# Patient Record
Sex: Female | Born: 1955 | Race: White | Hispanic: No | Marital: Single | Smoking: Former smoker
Health system: Southern US, Community
[De-identification: ages and names within clinical notes are randomized; demographics above are authoritative.]

## PROBLEM LIST (undated history)

## (undated) DIAGNOSIS — Z923 Personal history of irradiation: Secondary | ICD-10-CM

## (undated) DIAGNOSIS — F32A Depression, unspecified: Secondary | ICD-10-CM

## (undated) DIAGNOSIS — C519 Malignant neoplasm of vulva, unspecified: Secondary | ICD-10-CM

## (undated) DIAGNOSIS — Z8719 Personal history of other diseases of the digestive system: Secondary | ICD-10-CM

## (undated) DIAGNOSIS — F419 Anxiety disorder, unspecified: Secondary | ICD-10-CM

## (undated) DIAGNOSIS — Z86010 Personal history of colonic polyps: Secondary | ICD-10-CM

## (undated) DIAGNOSIS — D509 Iron deficiency anemia, unspecified: Secondary | ICD-10-CM

## (undated) DIAGNOSIS — C210 Malignant neoplasm of anus, unspecified: Secondary | ICD-10-CM

## (undated) DIAGNOSIS — E039 Hypothyroidism, unspecified: Secondary | ICD-10-CM

## (undated) DIAGNOSIS — M1990 Unspecified osteoarthritis, unspecified site: Secondary | ICD-10-CM

## (undated) DIAGNOSIS — E079 Disorder of thyroid, unspecified: Secondary | ICD-10-CM

## (undated) HISTORY — PX: COLONOSCOPY W/ POLYPECTOMY: SHX1380

## (undated) HISTORY — DX: Personal history of irradiation: Z92.3

## (undated) HISTORY — PX: WISDOM TOOTH EXTRACTION: SHX21

## (undated) HISTORY — DX: Personal history of colonic polyps: Z86.010

## (undated) HISTORY — PX: LAPAROSCOPIC GASTRIC BANDING WITH HIATAL HERNIA REPAIR: SHX6351

## (undated) HISTORY — DX: Disorder of thyroid, unspecified: E07.9

## (undated) HISTORY — PX: DILATION AND CURETTAGE OF UTERUS: SHX78

---

## 1999-03-23 HISTORY — PX: BREAST REDUCTION SURGERY: SHX8

## 2005-10-19 ENCOUNTER — Ambulatory Visit (HOSPITAL_COMMUNITY): Admission: RE | Admit: 2005-10-19 | Discharge: 2005-10-19 | Payer: Self-pay | Admitting: General Surgery

## 2005-10-27 ENCOUNTER — Ambulatory Visit (HOSPITAL_COMMUNITY): Admission: RE | Admit: 2005-10-27 | Discharge: 2005-10-27 | Payer: Self-pay | Admitting: General Surgery

## 2005-11-04 ENCOUNTER — Encounter: Admission: RE | Admit: 2005-11-04 | Discharge: 2005-11-04 | Payer: Self-pay | Admitting: General Surgery

## 2005-11-16 ENCOUNTER — Ambulatory Visit (HOSPITAL_BASED_OUTPATIENT_CLINIC_OR_DEPARTMENT_OTHER): Admission: RE | Admit: 2005-11-16 | Discharge: 2005-11-16 | Payer: Self-pay | Admitting: General Surgery

## 2005-11-20 ENCOUNTER — Ambulatory Visit: Payer: Self-pay | Admitting: Internal Medicine

## 2006-03-10 ENCOUNTER — Encounter: Admission: RE | Admit: 2006-03-10 | Discharge: 2006-06-08 | Payer: Self-pay | Admitting: General Surgery

## 2006-03-28 ENCOUNTER — Ambulatory Visit (HOSPITAL_COMMUNITY): Admission: RE | Admit: 2006-03-28 | Discharge: 2006-03-29 | Payer: Self-pay | Admitting: General Surgery

## 2006-03-28 HISTORY — PX: LAPAROSCOPIC GASTRIC BANDING WITH HIATAL HERNIA REPAIR: SHX6351

## 2006-07-11 ENCOUNTER — Encounter: Admission: RE | Admit: 2006-07-11 | Discharge: 2006-10-09 | Payer: Self-pay | Admitting: General Surgery

## 2006-11-28 ENCOUNTER — Encounter: Admission: RE | Admit: 2006-11-28 | Discharge: 2006-11-28 | Payer: Self-pay | Admitting: General Surgery

## 2006-12-05 ENCOUNTER — Ambulatory Visit (HOSPITAL_COMMUNITY): Admission: RE | Admit: 2006-12-05 | Discharge: 2006-12-05 | Payer: Self-pay | Admitting: Specialist

## 2007-03-21 ENCOUNTER — Encounter: Admission: RE | Admit: 2007-03-21 | Discharge: 2007-03-21 | Payer: Self-pay | Admitting: General Surgery

## 2007-06-27 ENCOUNTER — Encounter: Admission: RE | Admit: 2007-06-27 | Discharge: 2007-06-27 | Payer: Self-pay | Admitting: General Surgery

## 2010-08-07 NOTE — Op Note (Signed)
Leslie [name], Leslie [name]             ACCOUNT NO.:  [id number]   MEDICAL RECORD NO.:  [id number]          PATIENT TYPE:  AMB   LOCATION:  DAY                          FACILITY:  [facility]   PHYSICIAN:  Sharlet Salina T. Hoxworth, M.D.DATE OF BIRTH:  1955/07/30   DATE OF PROCEDURE:  03/28/2006  DATE OF DISCHARGE:                               OPERATIVE REPORT   PREOPERATIVE DIAGNOSES:  1. Morbid obesity.  2. Hiatal hernia.   POSTOPERATIVE DIAGNOSES:  1. Morbid obesity.  2. Hiatal hernia.   SURGICAL PROCEDURES:  Placement laparoscopic adjustable gastric band  with hiatal hernia repair.   SURGEON:  Lorne Skeens. Hoxworth, M.D.   ASSISTANT:  Sandria Bales. Ezzard Standing, M.D.   ANESTHESIA:  General.   BRIEF HISTORY:  Leslie [name] is a 55 year old female with progressive  morbid obesity unresponsive to medical management now with a BMI of 56.  She has undergone extensive preoperative evaluation, and we have elected  to proceed with placement of a laparoscopic adjustable gastric band.  Preoperative workup has revealed a small hiatal hernia as well which  will be repaired.  The nature of the procedure and risks have been  discussed extensively detailed elsewhere.  She is now brought to  operating room for this procedure.   DESCRIPTION OF OPERATION:  The patient brought to the operating room and  placed in the supine position on the operating table, and general  orotracheal anesthesia was induced.  She received preoperative  antibiotics.  PAS were placed.  Heparin 5000 units had been given  subcutaneously.  The abdomen was widely sterilely prepped and draped.  Correct patient and procedure were verified.  Local anesthesia was used  to infiltrate the trocar sites prior to the incisions.   A 1-cm incision was made in the left subcostal area and abdominal access  obtained without difficulty with a 11-mm Optiview trocar and  pneumoperitoneum established.  Under direct vision, a 15-mm trocar was  placed in  the right paraxiphoid space to the falciform ligament under  direct vision and an 11-mm trocar in the right upper midabdomen, another  11-mm trocar just to the left and above the umbilicus for the camera  port and a 5-mm trocar in the left flank.  Through a 5-mm epigastric  site, the [facility] retractor was placed and the left lobe of the liver  elevated, with good exposure of the hiatus and upper stomach.  The liver  was somewhat large, and the right lobe was in the way of the 15-mm  trocar through the falciform ligament in order to get up to the hiatus  and therefore placed an additional 5-mm trocar a little lower in the  right upper quadrant laterally.  The fundus and angle of His was exposed  and peritoneum incised over the top of the left crus.  The finger  dissector was used to bluntly dissect down along the left crus toward  the retrogastric space.  Following this, pars flaccida was divided in an  avascular area and the peritoneum divided up further to and above the  hiatus.  The right crus was completely exposed.  The peritoneum  just  anterior to the right crus was incised, and blunt dissection was carried  back toward the retroesophageal space.  There was a good-sized fat pad  through a hiatal hernia that was brought down out of the mediastinum and  into the abdomen.  The confluence of the crura posteriorly were  identified and posterior portion of the left crus identified.  The  hiatal hernia was present but was not extremely large.  The esophagus  was clearly identified both crura and the hiatal defect.  Following  this, the hiatus was closed with a single 0 Ethibond suture posteriorly.  Following this, the calibration tube was passed down into the stomach  and the balloon inflated with 10 mL.  This was then pulled back to the  hiatus, and the balloon would not go back up through the area of the  repair, indicating adequate closure and repair.  The calibration tube  balloon was  deflated and the calibration tube pulled back up to the  esophagus.  The finger dissector was then used to dissect just anterior  to the base of the right crus just inferior to the dissection for the  hiatal hernia repair, and the finger dissector was then passed through  the retrogastric space and deployed up through the previously dissected  area at the angle of His without difficulty.  An AT standard band system  was introduced through the 15-mm trocar and the tubing placed through  the finger dissector which then was straightened and brought back out of  the stomach and the tubing and band passed posterior to the stomach  without difficulty.  The tubing was placed through the buckle.  The  calibration tube passed back down into the stomach and then the band  buckled.  It was seen not to be excessively tight, and the calibration  tube was removed.  Holding the tubing down toward the patient's feet,  the fundus was then imbricated up over the band to the small gastric  pouch with three interrupted 2-0 Ethibond sutures.  This did not impinge  on the buckle, and the band was seen to rotate easily.  The abdomen was  inspected for hemostasis which appeared complete.  The [facility]  retractor was removed.  All CO2 was evacuated and trocars removed.  The  tubing had been brought out through the right midabdominal trocar, and  this was cut and attached to the port which was then sutured to the  anterior fascia after extending the incision and exposing the fascia  using four 2-0 Prolene sutures.  The subcutaneous tissue at this site  was closed with running 2-0 Vicryl.  The skin incisions were closed with  staples.  Sponge and instrument counts were correct.  Dry dressings were  applied and the patient taken to recovery in good condition.      Lorne Skeens. Hoxworth, M.D.  Electronically Signed     BTH/MEDQ  D:  03/28/2006  T:  03/28/2006  Job:  [id number]

## 2010-08-07 NOTE — Procedures (Signed)
[name], [name]             ACCOUNT NO.:  [id number]   MEDICAL RECORD NO.:  [id number]          PATIENT TYPE:  OUT   LOCATION:  SLEEP CENTER                 FACILITY:  [facility]   PHYSICIAN:  Clinton D. Maple Hudson, MD, FCCP, FACPDATE OF BIRTH:  30-Jul-1955   DATE OF STUDY:  10/27/2005                              NOCTURNAL POLYSOMNOGRAM   REFERRING PHYSICIAN:  Sharlet Salina T. Hoxworth, M.D.   INDICATION FOR STUDY:  Hypersomnia with sleep apnea.   EPWORTH SLEEPINESS SCORE:  14/24, BMI 55.8.  Weight 308 pounds.   HOME MEDICATION:  Synthroid, Rhinocort.   SLEEP ARCHITECTURE:  Short total sleep time, 185 minutes, with sleep  efficiency 50%.  Stage I was 16%, stage II 83%, stages III and IV absent,  REM 1% of total sleep time.  Sleep latency 67 minutes, REM latency 151  minutes, awake after sleep onset 109 minutes.  Sleep was fragmented by  frequent wakings through the night.  No bedtime medication was taken.  The  technician described her as very restless with difficulty maintaining  sleep and the patient asked to end the study at 4:42 a.m..  The patient  described the night as worse than usual but did not indicate why she had  problems, and no unusual circumstances distinguish her from the ordinary  sleep center experience.   RESPIRATORY DATA:  Apnea/hypopnea index (AHI, RDI) 1.6 obstructive events  per hour, which is within normal limits (normal range 0-5 per hour).  This  reflected 1 obstructive apnea and 4 hypopneas.  Most events occurred while  sleeping supine.  REM AHI 0.  She did not qualify for CPAP titration because  of insufficient events on this study night.   OXYGEN DATA:  Moderate to loud snoring with oxygen desaturation to a nadir  of 91%.  Mean oxygen saturation through the study was 96% on room air.   CARDIAC DATA:  Normal sinus rhythm.   MOVEMENT/PARASOMNIA:  A total of 118 limb jerks were recorded, of which 95  were associated with arousal or awakening for a periodic  limb movement with  arousal index of 30.7 per hour, which is markedly increased.  Bathroom x2.   IMPRESSION/RECOMMENDATION:  1. Short, fragmented sleep time.  The only obvious contributing factors      would be an unfamiliar environment and periodic limb movement.  2. Occasional sleep-disordered breathing events, AHI 1.6 per hour, which      is within normal limits, not meeting diagnostic criteria for      obstructive sleep apnea syndrome.  She did have moderate to loud      snoring with oxygen desaturation to a nadir of 91%.  Her respiratory      events were more frequent while supine, suggesting that encouragement      to sleep off the flat of her back and weight loss may be of benefit.  3. 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  Consider      specific therapy with Requip or Mirapex if appropriate.      Clinton D. Maple Hudson, MD, FCCP, FACP  Diplomate, Biomedical engineer of Sleep Medicine  Electronically Signed  CDY/MEDQ  D:  11/20/2005 17:57:34  T:  11/22/2005 08:32:33  Job:  [id number]

## 2013-02-01 ENCOUNTER — Ambulatory Visit: Payer: No Typology Code available for payment source | Attending: Internal Medicine | Admitting: Internal Medicine

## 2013-02-01 VITALS — BP 101/61 | HR 84 | Temp 97.8°F | Resp 16 | Ht 63.0 in | Wt 246.2 lb

## 2013-02-01 DIAGNOSIS — Z23 Encounter for immunization: Secondary | ICD-10-CM

## 2013-02-01 DIAGNOSIS — E039 Hypothyroidism, unspecified: Secondary | ICD-10-CM | POA: Insufficient documentation

## 2013-02-01 LAB — COMPLETE METABOLIC PANEL WITH GFR
ALT: 10 U/L (ref 0–35)
AST: 17 U/L (ref 0–37)
Creat: 1.08 mg/dL (ref 0.50–1.10)
Glucose, Bld: 88 mg/dL (ref 70–99)

## 2013-02-01 LAB — LIPID PANEL
Total CHOL/HDL Ratio: 6.9 Ratio
Triglycerides: 98 mg/dL (ref <150)
VLDL: 20 mg/dL (ref 0–40)

## 2013-02-01 LAB — CBC
HCT: 37.6 % (ref 36.0–46.0)
MCH: 27.7 pg (ref 26.0–34.0)
MCHC: 34.8 g/dL (ref 30.0–36.0)
MCV: 79.5 fL (ref 78.0–100.0)
Platelets: 278 10*3/uL (ref 150–400)
RDW: 17 % — ABNORMAL HIGH (ref 11.5–15.5)
WBC: 6 10*3/uL (ref 4.0–10.5)

## 2013-02-01 MED ORDER — LEVOTHYROXINE SODIUM 50 MCG PO TABS: 50.0000 ug | ORAL_TABLET | Freq: Every day | ORAL | Status: DC

## 2013-02-01 NOTE — Progress Notes (Signed)
Patient here to establish care Has history of hypothyroid\ Did not bring bottle -she does not recall the dose Has not been on since July of this year Also has a lap band

## 2013-02-01 NOTE — Progress Notes (Signed)
Patient ID: SAGEN VOILS, female   DOB: 28-Aug-1955, 57 y.o.   MRN: [id number]  Patient Demographics  Leslie [name], is a 57 y.o. female  CSN: [id number]  MRN: [id number]  DOB - 09-14-1955  Outpatient Primary MD for the patient is Jeanann Lewandowsky, MD   With History of -  Past Medical History  Diagnosis Date  . Thyroid disease - hypothyroid        Past Surgical History  Procedure Laterality Date  . Hernia repair    . Wisdom tooth extraction Lap band surgery 7 years ago       in for   Chief Complaint  Patient presents with  . Establish Care     HPI  Leslie [name]  is a 57 y.o. female, history of lap band surgery several years ago, hypothyroidism, who is out of a physician and her Synthroid medication since January of this year comes in to establish care after she has received orange card. She has no subjective complaints except few months ago she had a fall and sustained soft tissue injury to her left elbow after which she has been unable to extend it fully. She denies any fatigue, no constipation, no weight gain, no cold intolerance.    Review of Systems    In addition to the HPI above,   No Fever-chills, No Headache, No changes with Vision or hearing, No problems swallowing food or Liquids, No Chest pain, Cough or Shortness of Breath, No Abdominal pain, No Nausea or Vommitting, Bowel movements are regular, No Blood in stool or Urine, No dysuria, No new skin rashes or bruises, No new joints pains-aches, except injury to the left elbow several months ago after which he cannot extend it fully No new weakness, tingling, numbness in any extremity, No recent weight gain or loss, No polyuria, polydypsia or polyphagia, No significant Mental Stressors.  A full 10 point Review of Systems was done, except as stated above, all other Review of Systems were negative.   Social History History  Substance Use Topics  . Smoking status: Never Smoker   . Smokeless  tobacco: Not on file  . Alcohol Use: Not on file      Family History Diabetes mellitus 2 in both parents  Prior to Admission medications   Medication Sig Start Date End Date Taking? Authorizing Provider  levothyroxine (SYNTHROID) 50 MCG tablet Take 1 tablet (50 mcg total) by mouth daily before breakfast. 02/01/13  Yes Leroy Sea, MD    No Known Allergies  Physical Exam  Vitals  Blood pressure 101/61, pulse 84, temperature 97.8 F (36.6 C), resp. rate 16, height 5\' 3"  (1.6 m), weight 246 lb 3.2 oz (111.676 kg), SpO2 100.00%.   1. General middle-aged morbidly obese Caucasian female sitting on clinic examination table in no apparent distress,     2. Normal affect and insight, Not Suicidal or Homicidal, Awake Alert, Oriented X 3.  3. No F.N deficits, ALL C.Nerves Intact, Strength 5/5 all 4 extremities, Sensation intact all 4 extremities, Plantars down going.  4. Ears and Eyes appear Normal, Conjunctivae clear, PERRLA. Moist Oral Mucosa.  5. Supple Neck, No JVD, No cervical lymphadenopathy appriciated, No Carotid Bruits.  6. Symmetrical Chest wall movement, Good air movement bilaterally, CTAB.  7. RRR, No Gallops, Rubs or Murmurs, No Parasternal Heave.  8. Positive Bowel Sounds, Abdomen Soft, Non tender, No organomegaly appriciated,No rebound -guarding or rigidity.  9.  No Cyanosis, Normal Skin Turgor, No Skin Rash or Bruise.  10.  Good muscle tone,  joints appear normal , no effusions, Normal ROM. Left elbow cannot be extended beyond 75, no point tenderness, painless passive range of motion  11. No Palpable Lymph Nodes in Neck or Axillae     Data Review  No results found for this basename: WBC, HGB, HCT, MCV, PLT      Chemistry   No results found for this basename: NA, K, CL, CO2, BUN, CREATININE, GLU   No results found for this basename: CALCIUM, ALKPHOS, AST, ALT, BILITOT       No results found for this basename: HGBA1C    No results found for this  basename: CHOL, HDL, LDLCALC, LDLDIRECT, TRIG, CHOLHDL    No results found for this basename: TSH    No results found for this basename: PSA         Assessment and plan  Hypothyroidism. Out of her Synthroid since January, did not know the previous dose, placed on 50 mcg of Synthroid, baseline TSH now, expect TSH to be high, repeat TSH in 6 weeks.   Morbid obesity. Counseled on diet and exercise she status post lap band surgery several years ago.   Left elbow injury 6 months ago, she still has difficulties extending the left elbow. Probably has developed some chronic contracture or ligament injury. We'll obtain left elbow x-ray and refer her to orthopedics   Routine health maintenance.  Screening labs. CBC, CMP, TSH, A1c, lipid panel ordered  Referral made to [facility] for mammogram and Pap smear, GI for colonoscopy  Flu shot given   Leroy Sea M.D on 02/01/2013 at 11:33 AM

## 2013-02-02 MED ORDER — LEVOTHYROXINE SODIUM 50 MCG PO TABS: 50.0000 ug | ORAL_TABLET | Freq: Every day | ORAL | Status: DC

## 2013-02-02 NOTE — Progress Notes (Signed)
Quick Note:  Kindly inform the patient that he will be started on Synthroid, he should come back in 2 weeks to get TSH checked again, endocrine referral made ______

## 2013-02-08 ENCOUNTER — Ambulatory Visit: Payer: No Typology Code available for payment source | Admitting: Family Medicine

## 2013-02-09 ENCOUNTER — Ambulatory Visit (HOSPITAL_COMMUNITY)
Admission: RE | Admit: 2013-02-09 | Discharge: 2013-02-09 | Disposition: A | Discharge status: Home / Self Care | Payer: No Typology Code available for payment source | Source: Ambulatory Visit | Attending: Internal Medicine | Admitting: Internal Medicine

## 2013-02-09 DIAGNOSIS — M25529 Pain in unspecified elbow: Secondary | ICD-10-CM | POA: Insufficient documentation

## 2013-02-09 DIAGNOSIS — E039 Hypothyroidism, unspecified: Secondary | ICD-10-CM

## 2013-02-09 NOTE — Progress Notes (Addendum)
Quick Note:  Please let the patient know that he has L. Elbow fracture from the fall 6 months ago, to go to the ER to get evaluated.  Have called and left a message at [phone] ______

## 2013-02-12 ENCOUNTER — Ambulatory Visit (INDEPENDENT_AMBULATORY_CARE_PROVIDER_SITE_OTHER): Payer: No Typology Code available for payment source | Admitting: Family Medicine

## 2013-02-12 ENCOUNTER — Telehealth: Payer: Self-pay | Admitting: Emergency Medicine

## 2013-02-12 ENCOUNTER — Encounter: Payer: Self-pay | Admitting: Family Medicine

## 2013-02-12 VITALS — BP 119/81 | HR 80 | Ht 62.0 in | Wt 240.0 lb

## 2013-02-12 DIAGNOSIS — S59909A Unspecified injury of unspecified elbow, initial encounter: Secondary | ICD-10-CM

## 2013-02-12 DIAGNOSIS — S59902A Unspecified injury of left elbow, initial encounter: Secondary | ICD-10-CM

## 2013-02-12 NOTE — Telephone Encounter (Signed)
Message copied by Darlis Loan on Mon Feb 12, 2013  9:44 AM ------      Message from: [facility], [place] [facility]      Created: Fri Feb 09, 2013  6:56 PM       Please let the patient know that he has L. Elbow fracture from the fall 6 months ago, to go to the ER to get evaluated. ------

## 2013-02-12 NOTE — Telephone Encounter (Signed)
Pt given results and instructed to go to ER. Pt states she has appt this am with sports med and they have xrays. Informed to call clinic if she has questions.

## 2013-02-12 NOTE — Patient Instructions (Signed)
You sustained a radial head fracture. Initial treatment is a sling and/or posterior splint for 7-10 days - you are 4 months out and have already done this. The critical part of your treatment at this time is physical therapy to regain motion, strength. Do home exercises on days you don't go to therapy. Icing if needed 15 minutes at a time. Ibuprofen or aleve as needed for pain, inflammation. Follow up with me in 6 weeks.

## 2013-02-13 ENCOUNTER — Encounter: Payer: Self-pay | Admitting: Family Medicine

## 2013-02-13 DIAGNOSIS — S59902A Unspecified injury of left elbow, initial encounter: Secondary | ICD-10-CM | POA: Insufficient documentation

## 2013-02-13 NOTE — Assessment & Plan Note (Signed)
4 months out from radial head fracture.  Involves less than 30% articular surface and less than 5mm displacement so should do well with conservative care.  No tenderness here now.  Pain and lack of motion at this point primarily due to stiffness, scar tissue.  Should improve with physical therapy - start with this.  No use of sling or splinting at this point as it would worsen her motion.  Icing, ibuprofen if needed.  F/u in 6 weeks.

## 2013-02-13 NOTE — Progress Notes (Signed)
Patient ID: Leslie [name], female   DOB: 26-Feb-1956, 57 y.o.   MRN: [id number]  PCP: Jeanann Lewandowsky, MD  Subjective:   HPI: Patient is a 57 y.o. female here for left elbow fracture.  Patient reports in July of this year she tripped in her driveway going to the mailbox and landed on left elbow. Used a sling for 1-2 weeks, did not seek care initially. Still having pain and unable to straighten elbow out. Went to PCP and had x-rays showing a radial head fracture. Pain has improved though hurts with certain movements, opening doors. Feels weaker and stiff.  Past Medical History  Diagnosis Date  . Thyroid disease     Current Outpatient Prescriptions on File Prior to Visit  Medication Sig Dispense Refill  . levothyroxine (SYNTHROID) 50 MCG tablet Take 1 tablet (50 mcg total) by mouth daily before breakfast.  30 tablet  1   No current facility-administered medications on file prior to visit.    Past Surgical History  Procedure Laterality Date  . Hernia repair    . Wisdom tooth extraction      No Known Allergies  History   Social History  . Marital Status: Single    Spouse Name: N/A    Number of Children: N/A  . Years of Education: N/A   Occupational History  . Not on file.   Social History Main Topics  . Smoking status: Never Smoker   . Smokeless tobacco: Not on file  . Alcohol Use: Not on file  . Drug Use: Not on file  . Sexual Activity: Not on file   Other Topics Concern  . Not on file   Social History Narrative  . No narrative on file    Family History  Problem Relation Age of Onset  . Heart attack Mother   . Heart attack Father   . Diabetes Neg Hx   . Hyperlipidemia Neg Hx   . Hypertension Neg Hx   . Sudden death Neg Hx     BP 119/81  Pulse 80  Ht 5\' 2"  (1.575 m)  Wt 240 lb (108.863 kg)  BMI 43.89 kg/m2  Review of Systems: See HPI above.    Objective:  Physical Exam:  Gen: NAD  Left elbow: No gross deformity, swelling,  bruising. No focal TTP including radial head. Lacks 20 degrees extension and 10 degrees supination.  Full flexion and pronation. Collateral ligaments intact. Negative tinels cubital and radial tunnels.    Assessment & Plan:  1. Left elbow pain - 4 months out from radial head fracture.  Involves less than 30% articular surface and less than 5mm displacement so should do well with conservative care.  No tenderness here now.  Pain and lack of motion at this point primarily due to stiffness, scar tissue.  Should improve with physical therapy - start with this.  No use of sling or splinting at this point as it would worsen her motion.  Icing, ibuprofen if needed.  F/u in 6 weeks.

## 2013-02-20 ENCOUNTER — Ambulatory Visit: Payer: No Typology Code available for payment source | Attending: Family Medicine | Admitting: Physical Therapy

## 2013-02-20 DIAGNOSIS — M25539 Pain in unspecified wrist: Secondary | ICD-10-CM | POA: Insufficient documentation

## 2013-02-20 DIAGNOSIS — IMO0001 Reserved for inherently not codable concepts without codable children: Secondary | ICD-10-CM | POA: Insufficient documentation

## 2013-02-20 DIAGNOSIS — M25639 Stiffness of unspecified wrist, not elsewhere classified: Secondary | ICD-10-CM | POA: Insufficient documentation

## 2013-02-22 ENCOUNTER — Ambulatory Visit: Payer: No Typology Code available for payment source | Admitting: Physical Therapy

## 2013-02-27 ENCOUNTER — Ambulatory Visit: Payer: No Typology Code available for payment source | Admitting: Physical Therapy

## 2013-03-02 ENCOUNTER — Ambulatory Visit: Payer: No Typology Code available for payment source | Admitting: Physical Therapy

## 2013-03-06 ENCOUNTER — Ambulatory Visit: Payer: No Typology Code available for payment source | Admitting: Rehabilitation

## 2013-03-09 ENCOUNTER — Ambulatory Visit: Payer: No Typology Code available for payment source | Admitting: Physical Therapy

## 2013-03-12 ENCOUNTER — Ambulatory Visit: Payer: No Typology Code available for payment source | Admitting: Physical Therapy

## 2013-03-14 ENCOUNTER — Ambulatory Visit: Payer: No Typology Code available for payment source | Admitting: Physical Therapy

## 2013-03-19 ENCOUNTER — Ambulatory Visit: Payer: No Typology Code available for payment source | Admitting: Physical Therapy

## 2013-03-21 ENCOUNTER — Ambulatory Visit: Payer: No Typology Code available for payment source | Admitting: Rehabilitation

## 2013-03-22 DIAGNOSIS — Z860101 Personal history of adenomatous and serrated colon polyps: Secondary | ICD-10-CM

## 2013-03-22 HISTORY — DX: Personal history of adenomatous and serrated colon polyps: Z86.0101

## 2013-03-26 ENCOUNTER — Ambulatory Visit: Payer: 59 | Attending: Internal Medicine | Admitting: Internal Medicine

## 2013-03-26 VITALS — BP 103/71 | HR 66 | Temp 98.7°F | Resp 15 | Ht 62.0 in | Wt 243.2 lb

## 2013-03-26 DIAGNOSIS — E039 Hypothyroidism, unspecified: Secondary | ICD-10-CM | POA: Insufficient documentation

## 2013-03-26 DIAGNOSIS — E785 Hyperlipidemia, unspecified: Secondary | ICD-10-CM | POA: Insufficient documentation

## 2013-03-26 LAB — LIPID PANEL
CHOL/HDL RATIO: 5.5 ratio
CHOLESTEROL: 297 mg/dL — AB (ref 0–200)
HDL: 54 mg/dL (ref >39)
LDL Cholesterol: 226 mg/dL — ABNORMAL HIGH (ref 0–99)
Triglycerides: 87 mg/dL (ref <150)
VLDL: 17 mg/dL (ref 0–40)

## 2013-03-26 LAB — T3, FREE: T3 FREE: 1.8 pg/mL — AB (ref 2.3–4.2)

## 2013-03-26 LAB — TSH: TSH: 112.036 u[IU]/mL — ABNORMAL HIGH (ref 0.350–4.500)

## 2013-03-26 LAB — T4, FREE: Free T4: 0.74 ng/dL — ABNORMAL LOW (ref 0.80–1.80)

## 2013-03-26 MED ORDER — LEVOTHYROXINE SODIUM 50 MCG PO TABS: 75.0000 ug | ORAL_TABLET | Freq: Every day | ORAL | Status: DC

## 2013-03-26 MED ORDER — ASPIRIN EC 81 MG PO TBEC: 81.0000 mg | DELAYED_RELEASE_TABLET | Freq: Every day | ORAL | Status: DC

## 2013-03-26 MED ORDER — ATORVASTATIN CALCIUM 40 MG PO TABS: 40.0000 mg | ORAL_TABLET | Freq: Every day | ORAL | Status: DC

## 2013-03-26 NOTE — Progress Notes (Signed)
Patient ID: Leslie [name], female   DOB: [birth year], 58 y.o.   MRN: [id number]   CC:  HPI:  58 year old female here for followup. TSH was markedly elevated during her last appointment. She was started on Synthroid. She states that she feels a lot better since she started her Synthroid She denies any cardiopulmonary symptoms. She used to have dizzy spells which have improved. She has a referral to see an endocrinologist because of a markedly elevated TSH Discussed her LDL was markedly elevated at 452    No Known Allergies Past Medical History  Diagnosis Date  . Thyroid disease    No current outpatient prescriptions on file prior to visit.   No current facility-administered medications on file prior to visit.   Family History  Problem Relation Age of Onset  . Heart attack Mother   . Heart attack Father   . Diabetes Neg Hx   . Hyperlipidemia Neg Hx   . Hypertension Neg Hx   . Sudden death Neg Hx    History   Social History  . Marital Status: Single    Spouse Name: N/A    Number of Children: N/A  . Years of Education: N/A   Occupational History  . Not on file.   Social History Main Topics  . Smoking status: Never Smoker   . Smokeless tobacco: Not on file  . Alcohol Use: Not on file  . Drug Use: Not on file  . Sexual Activity: Not on file   Other Topics Concern  . Not on file   Social History Narrative  . No narrative on file    Review of Systems  Constitutional: Negative for fever, chills, diaphoresis, activity change, appetite change and fatigue.  HENT: Negative for ear pain, nosebleeds, congestion, facial swelling, rhinorrhea, neck pain, neck stiffness and ear discharge.   Eyes: Negative for pain, discharge, redness, itching and visual disturbance.  Respiratory: Negative for cough, choking, chest tightness, shortness of breath, wheezing and stridor.   Cardiovascular: Negative for chest pain, palpitations and leg swelling.  Gastrointestinal: Negative for  abdominal distention.  Genitourinary: Negative for dysuria, urgency, frequency, hematuria, flank pain, decreased urine volume, difficulty urinating and dyspareunia.  Musculoskeletal: Negative for back pain, joint swelling, arthralgias and gait problem.  Neurological: Negative for dizziness, tremors, seizures, syncope, facial asymmetry, speech difficulty, weakness, light-headedness, numbness and headaches.  Hematological: Negative for adenopathy. Does not bruise/bleed easily.  Psychiatric/Behavioral: Negative for hallucinations, behavioral problems, confusion, dysphoric mood, decreased concentration and agitation.    Objective:   Filed Vitals:   03/26/13 1015  BP: 103/71  Pulse: 66  Temp: 98.7 F (37.1 C)  Resp: 15    Physical Exam  Constitutional: Appears well-developed and well-nourished. No distress.  HENT: Normocephalic. External right and left ear normal. Oropharynx is clear and moist.  Eyes: Conjunctivae and EOM are normal. PERRLA, no scleral icterus.  Neck: Normal ROM. Neck supple. No JVD. No tracheal deviation. No thyromegaly.  CVS: RRR, S1/S2 +, no murmurs, no gallops, no carotid bruit.  Pulmonary: Effort and breath sounds normal, no stridor, rhonchi, wheezes, rales.  Abdominal: Soft. BS +,  no distension, tenderness, rebound or guarding.  Musculoskeletal: Normal range of motion. No edema and no tenderness.  Lymphadenopathy: No lymphadenopathy noted, cervical, inguinal. Neuro: Alert. Normal reflexes, muscle tone coordination. No cranial nerve deficit. Skin: Skin is warm and dry. No rash noted. Not diaphoretic. No erythema. No pallor.  Psychiatric: Normal mood and affect. Behavior, judgment, thought content normal.   Lab Results  Component Value Date   WBC 6.0 02/01/2013   HGB 13.1 02/01/2013   HCT 37.6 02/01/2013   MCV 79.5 02/01/2013   PLT 278 02/01/2013   Lab Results  Component Value Date   CREATININE 1.08 02/01/2013   BUN 15 02/01/2013   NA 135 02/01/2013   K  4.2 02/01/2013   CL 97 02/01/2013   CO2 28 02/01/2013    Lab Results  Component Value Date   HGBA1C 5.3 02/01/2013   Lipid Panel     Component Value Date/Time   CHOL 520* 02/01/2013 1139   TRIG 98 02/01/2013 1139   HDL 75 02/01/2013 1139   CHOLHDL 6.9 02/01/2013 1139   VLDL 20 02/01/2013 1139   LDLCALC 425* 02/01/2013 1139       Assessment and plan:   Patient Active Problem List   Diagnosis Date Noted  . Injury of left elbow 02/13/2013  . Unspecified hypothyroidism 02/01/2013   Hypothyroidism Repeat thyroid function test Increase levothyroxine to 75 mcg      Hyperlipidemia Start the patient on Lipitor 40 mg Also recommended the patient to start a baby aspirin Repeat lipid panel today Followup in 3 months   The patient was given clear instructions to go to ER or return to medical center if symptoms don't improve, worsen or new problems develop. The patient verbalized understanding. The patient was told to call to get any lab results if not heard anything in the next week.

## 2013-03-26 NOTE — Progress Notes (Signed)
Pt is here for a f/u appointment and a physical. Pt feelign good today with no symptoms of anything.

## 2013-03-27 ENCOUNTER — Ambulatory Visit: Payer: No Typology Code available for payment source | Admitting: Family Medicine

## 2013-03-28 ENCOUNTER — Telehealth: Payer: Self-pay | Admitting: *Deleted

## 2013-03-28 NOTE — Telephone Encounter (Signed)
Left a message for pt to give us a call back. 

## 2013-03-28 NOTE — Telephone Encounter (Signed)
Message copied by Kendahl Bumgardner, [place] R on Wed Mar 28, 2013  3:49 PM ------      Message from: Allyson Sabal MD, [facility]      Created: Wed Mar 28, 2013  3:31 PM       Please notify patient of the patient's TSH is improved. Continue Synthroid 75 mcg, repeat TSH, free T4 in 2 months       ------

## 2013-03-29 ENCOUNTER — Encounter: Payer: Self-pay | Admitting: Internal Medicine

## 2013-03-30 ENCOUNTER — Telehealth: Payer: Self-pay | Admitting: *Deleted

## 2013-03-30 NOTE — Telephone Encounter (Signed)
Contacted pt to notify patient of the patient's TSH is improved. Continue Synthroid 75 mcg, repeat TSH, free T4 in 2 months. Left a voice message for pt to give [place] a call back.

## 2013-03-30 NOTE — Telephone Encounter (Signed)
Notified pt of her lab results and to continue taking Synthroid. Pt will schedule and appointment for more labs in 2 months.

## 2013-04-02 ENCOUNTER — Encounter: Payer: Self-pay | Admitting: Endocrinology

## 2013-04-02 ENCOUNTER — Ambulatory Visit (INDEPENDENT_AMBULATORY_CARE_PROVIDER_SITE_OTHER): Payer: No Typology Code available for payment source | Admitting: Endocrinology

## 2013-04-02 VITALS — BP 122/78 | HR 78 | Temp 98.1°F | Resp 16 | Ht 62.0 in | Wt 241.1 lb

## 2013-04-02 DIAGNOSIS — E039 Hypothyroidism, unspecified: Secondary | ICD-10-CM

## 2013-04-02 DIAGNOSIS — E785 Hyperlipidemia, unspecified: Secondary | ICD-10-CM

## 2013-04-02 MED ORDER — LEVOTHYROXINE SODIUM 150 MCG PO TABS: 150.0000 ug | ORAL_TABLET | Freq: Every day | ORAL | Status: DC

## 2013-04-02 NOTE — Progress Notes (Signed)
Patient ID: Leslie [name], female   DOB: 01/04/1956, 58 y.o.   MRN: [id number]  Reason for Appointment:  Hypothyroidism, new visit    History of Present Illness:   The Hyothyroidism was first diagnosed in 1990   The symptoms consistent with hypothyroidism initially were: A weight gain of 60-70 pounds, feeling of depression,  fatigue, heavy periods, cold sensitivity , difficulty concentrating, dry skin, hair loss and vertigo. Apparently her symptoms had gone on for up to 3 years before she was diagnosed. Detailed records are not available. Although initially she was treated only with 25 mcg she was subsequently followed by an endocrinologist who had increased her dose to 150 mcg With the treatment she did feel overall better but not back to normal. She thinks she continued to feel somewhat tired and had difficulty losing any weight  She was followed by the endocrinologist until early 2014 and was taking 150 mcg before she ran out She was unable to get her prescription refilled in 7/14 because of lack of insurance With this her symptoms of hypothyroidism came back and she had most of the above symptoms again including fatigue, weight gain and depression           She was finally reevaluated in 11/14 at the indigent local clinic and at that time with her TSH of 201.6.  She was started on 50 mcg of Synthroid and only this month told to go up to 75 mcg. She is generally compliant with taking the medication in the morning her She is subjectively feeling somewhat better but still has significant fatigue and some depression, dry skin and hair loss  LABS:  No visits with results within 1 Week(s) from this visit. Latest known visit with results is:  Office Visit on 03/26/2013  Component Date Value Range Status  . TSH 03/26/2013 112.036* 0.350 - 4.500 uIU/mL Final  . Free T4 03/26/2013 0.74* 0.80 - 1.80 ng/dL Final  . Cholesterol 84/13/2440 297* 0 - 200 mg/dL Final   Comment: ATP III  Classification:                                < 200        mg/dL        Desirable                               200 - 239     mg/dL        Borderline High                               >= 240        mg/dL        High                             . Triglycerides 03/26/2013 87  <150 mg/dL Final  . HDL 01/16/2535 54  >39 mg/dL Final  . Total CHOL/HDL Ratio 03/26/2013 5.5   Final  . VLDL 03/26/2013 17  0 - 40 mg/dL Final  . LDL Cholesterol 03/26/2013 226* 0 - 99 mg/dL Final   Comment:  Total Cholesterol/HDL Ratio:CHD Risk                                                 Coronary Heart Disease Risk Table                                                                 Men       Women                                   1/2 Average Risk              3.4        3.3                                       Average Risk              5.0        4.4                                    2X Average Risk              9.6        7.1                                    3X Average Risk             23.4       11.0                          Use the calculated Patient Ratio above and the CHD Risk table                           to determine the patient's CHD Risk.                          ATP III Classification (LDL):                                < 100        mg/dL         Optimal                               100 - 129     mg/dL         Near or Above Optimal                               130 - 159  mg/dL         Borderline High                               160 - 189     mg/dL         High                                > 190        mg/dL         Very High                             . T3, Free 03/26/2013 1.8* 2.3 - 4.2 pg/mL Final    Past Medical History  Diagnosis Date  . Thyroid disease     Past Surgical History  Procedure Laterality Date  . Hernia repair    . Wisdom tooth extraction      Family History  Problem Relation Age of Onset  . Heart attack Mother   . Heart  attack Father   . Diabetes Neg Hx   . Hyperlipidemia Neg Hx   . Hypertension Neg Hx   . Sudden death Neg Hx    Father mild hypo  Social History:  reports that she has never smoked. She does not have any smokeless tobacco history on file. Her alcohol and drug histories are not on file.  Allergies: No Known Allergies    Medication List       This list is accurate as of: 04/02/13 11:20 AM.  Always use your most recent med list.               aspirin EC 81 MG tablet  Take 1 tablet (81 mg total) by mouth daily.     atorvastatin 40 MG tablet  Commonly known as:  LIPITOR  Take 1 tablet (40 mg total) by mouth daily.     levothyroxine 50 MCG tablet  Commonly known as:  SYNTHROID  Take 1.5 tablets (75 mcg total) by mouth daily before breakfast.        Review of Systems:  She has had difficulty with weight loss. She was previously was using Recumbent bike but has not been motivated to do this recently  Wt Readings from Last 3 Encounters:  04/02/13 241 lb 1.6 oz (109.362 kg)  03/26/13 243 lb 3.2 oz (110.315 kg)  02/12/13 240 lb (108.863 kg)   CARDIOLOGY: no history of high blood pressure.            GI: no Change in bowel habits.      ENDOCRINOLOGY:  no history of Diabetes.     Hyperlipidemia: She had marked increase in cholesterol levels recently. However she thinks that her cholesterol levels are usually nearly normal when she has control of her hypothyroidism. She was given Lipitor 40 mg daily to try but since she had an episode of reflux with this she did not try it again and is not taking it Hands have been numb for about 1 year especially in the right side. This is mostly positional if she is resting on it. Occasionally has symptoms at night Has some puffiness of her hands and feet, recently mostly in her hands in the morning No numbness beginning in her feet    Examination:    BP 122/78  Pulse 78  Temp(Src) 98.1 F (36.7 C)  Resp 16  Ht 5\' 2"  (1.575 m)  Wt 241  lb 1.6 oz (109.362 kg)  BMI 44.09 kg/m2  SpO2 97%   General Appearance: pleasant, has generalized obesity. Her face appears mildly puffy          Eyes: No proptosis or eyelid swelling. Fundi grossly normal, conjunctiva normal.          Neck: The thyroid is nonpalpable  There is no lymphadenopathy .    Cardiovascular: Normal  heart sounds, no murmur Respiratory:  Lungs clear Gastrointestinal: abdomen soft, no hepatosplenomegaly or  tenderness      Neurological: REFLEXES: at biceps are show slow relaxation, ankle reflexes absent Tinel's sign appears negative bilaterally    Skin: Dry especially on the arms and hands. Has mild clubbing      Assessments  Primary hypothyroidism, long-standing with significant symptoms and TSH of over 200 prior to resuming supplementation recently Apparently had been well controlled with 150 mcg in the past but is currently on very low dose supplement and was restarted in 11/14 and currently taking only on 75 mcg Objectively also does appear to have signs of hypothyroidism No history of coronary disease or chest pain to limit escalating her thyroid supplement dose. Also apparently she was not symptomatically feeling back to normal even with normal TSH levels in the past  HYPERLIPIDEMIA: This is likely to be from profound hypothyroidism and may resolve with correction of the hypothyroidism  Treatment:  She will go back to 150 mcg levothyroxine, most likely will need to continue with this dose long-term Will check her TSH again in 2 months to confirm adequacy of this dosage Consider Armour Thyroid if she is still having significant fatigue with normal TSH levels, discussed briefly how T3 supplementation may work Will  also try to get records from her previous endocrinologist for review  Followup lipids in 3-6 months, she can hold off Lipitor until then  [facility] 04/02/2013, 11:20 AM

## 2013-04-02 NOTE — Patient Instructions (Signed)
Rx for 150ug daily

## 2013-04-11 ENCOUNTER — Ambulatory Visit (INDEPENDENT_AMBULATORY_CARE_PROVIDER_SITE_OTHER): Payer: No Typology Code available for payment source | Admitting: Family Medicine

## 2013-04-11 ENCOUNTER — Encounter: Payer: Self-pay | Admitting: Internal Medicine

## 2013-04-11 ENCOUNTER — Encounter: Payer: Self-pay | Admitting: Family Medicine

## 2013-04-11 VITALS — BP 124/73 | HR 76 | Ht 62.0 in | Wt 243.0 lb

## 2013-04-11 DIAGNOSIS — M25522 Pain in left elbow: Secondary | ICD-10-CM

## 2013-04-11 DIAGNOSIS — S59919A Unspecified injury of unspecified forearm, initial encounter: Secondary | ICD-10-CM

## 2013-04-11 DIAGNOSIS — S6990XA Unspecified injury of unspecified wrist, hand and finger(s), initial encounter: Secondary | ICD-10-CM

## 2013-04-11 DIAGNOSIS — S59909A Unspecified injury of unspecified elbow, initial encounter: Secondary | ICD-10-CM

## 2013-04-11 DIAGNOSIS — M25529 Pain in unspecified elbow: Secondary | ICD-10-CM

## 2013-04-11 DIAGNOSIS — S59902A Unspecified injury of left elbow, initial encounter: Secondary | ICD-10-CM

## 2013-04-11 NOTE — Patient Instructions (Signed)
We will write a new order for physical therapy for your elbow (motion and strengthening). Continue home exercises for this. For hips start lateral side raise exercises 3 sets of 10 once a day - add ankle weight if this becomes too easy. Stretches - pick 2-3 on the handout, hold for 20 seconds and repeat 3 times once a day. Follow up with me in 6 weeks. Consider injection, physical therapy with iontophoresis of your hips.

## 2013-04-12 ENCOUNTER — Encounter: Payer: Self-pay | Admitting: Family Medicine

## 2013-04-12 NOTE — Progress Notes (Signed)
Patient ID: Leslie [name], female   DOB: 1955-08-22, 58 y.o.   MRN: [id number]  PCP: Angelica Chessman, MD  Subjective:   HPI: Patient is a 58 y.o. female here for left elbow fracture.  02/12/13: Patient reports in July of this year she tripped in her driveway going to the mailbox and landed on left elbow. Used a sling for 1-2 weeks, did not seek care initially. Still having pain and unable to straighten elbow out. Went to PCP and had x-rays showing a radial head fracture. Pain has improved though hurts with certain movements, opening doors. Feels weaker and stiff.  04/11/13: Patient reports she feels much better. Has done 8 visits of PT and motion improving. Pain only a 1/10 now. No other complaints. Not taking any pain medication.  Past Medical History  Diagnosis Date  . Thyroid disease     Current Outpatient Prescriptions on File Prior to Visit  Medication Sig Dispense Refill  . aspirin EC 81 MG tablet Take 1 tablet (81 mg total) by mouth daily.  60 tablet  5  . atorvastatin (LIPITOR) 40 MG tablet Take 1 tablet (40 mg total) by mouth daily.  90 tablet  3  . levothyroxine (SYNTHROID) 150 MCG tablet Take 1 tablet (150 mcg total) by mouth daily before breakfast.  60 tablet  2   No current facility-administered medications on file prior to visit.    Past Surgical History  Procedure Laterality Date  . Hernia repair    . Wisdom tooth extraction      No Known Allergies  History   Social History  . Marital Status: Single    Spouse Name: N/A    Number of Children: N/A  . Years of Education: N/A   Occupational History  . Not on file.   Social History Main Topics  . Smoking status: Never Smoker   . Smokeless tobacco: Not on file  . Alcohol Use: Not on file  . Drug Use: Not on file  . Sexual Activity: Not on file   Other Topics Concern  . Not on file   Social History Narrative  . No narrative on file    Family History  Problem Relation Age of Onset  .  Heart attack Mother   . Heart attack Father   . Diabetes Neg Hx   . Hyperlipidemia Neg Hx   . Hypertension Neg Hx   . Sudden death Neg Hx     BP 124/73  Pulse 76  Ht 5\' 2"  (1.575 m)  Wt 243 lb (110.224 kg)  BMI 44.43 kg/m2  Review of Systems: See HPI above.    Objective:  Physical Exam:  Gen: NAD  Left elbow: No gross deformity, swelling, bruising. No focal TTP including radial head. Lacks 5-10 degrees extension and 5 degrees supination.  Full flexion and pronation. Collateral ligaments intact. Negative tinels cubital and radial tunnels.    Assessment & Plan:  1. Left elbow pain - 5 months out from radial head fracture.  Involves less than 30% articular surface and less than 51mm displacement so should do well with conservative care.  Much improved with 8 visits of PT - will continue with this.  F/u in 6 weeks.  Note: also had questions about trochanteric bursitis - reviewed exercises, stretches, discussed injection, PT.

## 2013-04-12 NOTE — Assessment & Plan Note (Signed)
5 months out from radial head fracture.  Involves less than 30% articular surface and less than 37mm displacement so should do well with conservative care.  Much improved with 8 visits of PT - will continue with this.  F/u in 6 weeks.

## 2013-04-16 ENCOUNTER — Ambulatory Visit: Payer: 59 | Attending: Family Medicine | Admitting: Physical Therapy

## 2013-04-16 DIAGNOSIS — M25539 Pain in unspecified wrist: Secondary | ICD-10-CM | POA: Insufficient documentation

## 2013-04-16 DIAGNOSIS — IMO0001 Reserved for inherently not codable concepts without codable children: Secondary | ICD-10-CM | POA: Insufficient documentation

## 2013-04-16 DIAGNOSIS — M25639 Stiffness of unspecified wrist, not elsewhere classified: Secondary | ICD-10-CM | POA: Insufficient documentation

## 2013-04-16 DIAGNOSIS — E669 Obesity, unspecified: Secondary | ICD-10-CM | POA: Insufficient documentation

## 2013-04-18 ENCOUNTER — Ambulatory Visit: Payer: 59 | Admitting: Physical Therapy

## 2013-04-23 ENCOUNTER — Ambulatory Visit: Payer: 59 | Attending: Family Medicine | Admitting: Physical Therapy

## 2013-04-23 DIAGNOSIS — IMO0001 Reserved for inherently not codable concepts without codable children: Secondary | ICD-10-CM | POA: Insufficient documentation

## 2013-04-23 DIAGNOSIS — M25539 Pain in unspecified wrist: Secondary | ICD-10-CM | POA: Insufficient documentation

## 2013-04-23 DIAGNOSIS — M25639 Stiffness of unspecified wrist, not elsewhere classified: Secondary | ICD-10-CM | POA: Insufficient documentation

## 2013-04-26 ENCOUNTER — Ambulatory Visit: Payer: 59 | Admitting: Physical Therapy

## 2013-04-30 ENCOUNTER — Ambulatory Visit: Payer: 59 | Admitting: Physical Therapy

## 2013-05-03 ENCOUNTER — Ambulatory Visit: Payer: 59 | Admitting: Physical Therapy

## 2013-05-08 ENCOUNTER — Ambulatory Visit: Payer: 59 | Admitting: Physical Therapy

## 2013-05-10 ENCOUNTER — Ambulatory Visit: Payer: 59 | Admitting: Physical Therapy

## 2013-05-14 ENCOUNTER — Ambulatory Visit: Payer: 59 | Admitting: Physical Therapy

## 2013-05-17 ENCOUNTER — Ambulatory Visit: Payer: 59 | Admitting: Physical Therapy

## 2013-05-21 ENCOUNTER — Ambulatory Visit (AMBULATORY_SURGERY_CENTER): Payer: Self-pay | Admitting: *Deleted

## 2013-05-21 VITALS — Ht 62.0 in | Wt 240.6 lb

## 2013-05-21 DIAGNOSIS — Z1211 Encounter for screening for malignant neoplasm of colon: Secondary | ICD-10-CM

## 2013-05-21 MED ORDER — NA SULFATE-K SULFATE-MG SULF 17.5-3.13-1.6 GM/177ML PO SOLN: ORAL | Status: DC

## 2013-05-21 NOTE — Progress Notes (Signed)
Patient came into pre-visit today for screening colonoscopy. Patient c/o upper epigastric pain only after eating, this has been happening for few weeks now. Patient has had Lap. Band placed and repair of hiatal hernia about 7 years ago. Explained that she needs office visit, offered this Friday with Jessica,PA or 06/19/13 with Dr.Gessner. She did not want to make this appointment at this time. She states she is to see her primary doctor soon and she will talk with them. Encouraged patient to call us back if needed before colonoscopy. She understands.

## 2013-05-21 NOTE — Progress Notes (Signed)
Patient denies any allergies to eggs or soy. Patient denies any problems with anesthesia.  

## 2013-05-23 ENCOUNTER — Ambulatory Visit (INDEPENDENT_AMBULATORY_CARE_PROVIDER_SITE_OTHER): Payer: 59 | Admitting: Family Medicine

## 2013-05-23 ENCOUNTER — Encounter: Payer: Self-pay | Admitting: Family Medicine

## 2013-05-23 VITALS — BP 91/62 | HR 73 | Ht 62.0 in | Wt 240.0 lb

## 2013-05-23 DIAGNOSIS — S6990XA Unspecified injury of unspecified wrist, hand and finger(s), initial encounter: Secondary | ICD-10-CM

## 2013-05-23 DIAGNOSIS — S59909A Unspecified injury of unspecified elbow, initial encounter: Secondary | ICD-10-CM

## 2013-05-23 DIAGNOSIS — S59919A Unspecified injury of unspecified forearm, initial encounter: Secondary | ICD-10-CM

## 2013-05-23 DIAGNOSIS — S59902A Unspecified injury of left elbow, initial encounter: Secondary | ICD-10-CM

## 2013-05-24 ENCOUNTER — Encounter: Payer: Self-pay | Admitting: Internal Medicine

## 2013-05-24 ENCOUNTER — Encounter: Payer: Self-pay | Admitting: Family Medicine

## 2013-05-24 NOTE — Progress Notes (Signed)
Patient ID: Leslie [name], female   DOB: 31-Jan-1956, 58 y.o.   MRN: [id number]  PCP: Reginia Forts, MD  Subjective:   HPI: Patient is a 58 y.o. female here for left elbow fracture.  02/12/13: Patient reports in July of this year she tripped in her driveway going to the mailbox and landed on left elbow. Used a sling for 1-2 weeks, did not seek care initially. Still having pain and unable to straighten elbow out. Went to PCP and had x-rays showing a radial head fracture. Pain has improved though hurts with certain movements, opening doors. Feels weaker and stiff.  04/11/13: Patient reports she feels much better. Has done 8 visits of PT and motion improving. Pain only a 1/10 now. No other complaints. Not taking any pain medication.  3/4: Patient reports she feels much better. Still lacks 3 degrees extension. Finished with physical therapy but continues to do home exercises. Stiff and achy at times. No other complaints. Not taking anything for pain.  Past Medical History  Diagnosis Date  . Thyroid disease     Current Outpatient Prescriptions on File Prior to Visit  Medication Sig Dispense Refill  . aspirin EC 81 MG tablet Take 1 tablet (81 mg total) by mouth daily.  60 tablet  5  . levothyroxine (SYNTHROID) 150 MCG tablet Take 1 tablet (150 mcg total) by mouth daily before breakfast.  60 tablet  2  . Na Sulfate-K Sulfate-Mg Sulf SOLN Take as directed  354 mL  0   No current facility-administered medications on file prior to visit.    Past Surgical History  Procedure Laterality Date  . Wisdom tooth extraction    . Laparoscopic gastric banding with hiatal hernia repair  7 yrs ago    No Known Allergies  History   Social History  . Marital Status: Single    Spouse Name: N/A    Number of Children: N/A  . Years of Education: N/A   Occupational History  . Not on file.   Social History Main Topics  . Smoking status: Never Smoker   . Smokeless tobacco: Never Used   . Alcohol Use: 1.8 oz/week    3 Glasses of wine per week  . Drug Use: No  . Sexual Activity: Not on file   Other Topics Concern  . Not on file   Social History Narrative  . No narrative on file    Family History  Problem Relation Age of Onset  . Heart attack Mother   . Heart attack Father   . Diabetes Neg Hx   . Hyperlipidemia Neg Hx   . Hypertension Neg Hx   . Sudden death Neg Hx   . Colon cancer Maternal Grandmother     BP 91/62  Pulse 73  Ht 5\' 2"  (1.575 m)  Wt 240 lb (108.863 kg)  BMI 43.89 kg/m2  Review of Systems: See HPI above.    Objective:  Physical Exam:  Gen: NAD  Left elbow: No gross deformity, swelling, bruising. No focal TTP including radial head. Lacks 5 degrees extension.  Full supination.  Full flexion and pronation. Collateral ligaments intact.    Assessment & Plan:  1. Left elbow pain - s/p radial head fracture.  Done very well with physical therapy.  Still has some pain and lacks a few degrees extension.  Will continue with home exercises.  Follow up as needed.

## 2013-05-24 NOTE — Assessment & Plan Note (Signed)
s/p radial head fracture.  Done very well with physical therapy.  Still has some pain and lacks a few degrees extension.  Will continue with home exercises.  Follow up as needed.

## 2013-05-31 ENCOUNTER — Other Ambulatory Visit (INDEPENDENT_AMBULATORY_CARE_PROVIDER_SITE_OTHER): Payer: 59

## 2013-05-31 DIAGNOSIS — E039 Hypothyroidism, unspecified: Secondary | ICD-10-CM

## 2013-05-31 LAB — TSH: TSH: 0.58 u[IU]/mL (ref 0.35–5.50)

## 2013-05-31 LAB — T4, FREE: FREE T4: 1.66 ng/dL — AB (ref 0.60–1.60)

## 2013-06-04 ENCOUNTER — Ambulatory Visit (AMBULATORY_SURGERY_CENTER): Payer: 59 | Admitting: Internal Medicine

## 2013-06-04 ENCOUNTER — Encounter: Payer: Self-pay | Admitting: Internal Medicine

## 2013-06-04 VITALS — BP 99/60 | HR 66 | Temp 97.3°F | Resp 15 | Ht 62.0 in | Wt 240.0 lb

## 2013-06-04 DIAGNOSIS — D126 Benign neoplasm of colon, unspecified: Secondary | ICD-10-CM

## 2013-06-04 DIAGNOSIS — Z1211 Encounter for screening for malignant neoplasm of colon: Secondary | ICD-10-CM

## 2013-06-04 HISTORY — PX: COLONOSCOPY WITH PROPOFOL: SHX5780

## 2013-06-04 MED ORDER — SODIUM CHLORIDE 0.9 % IV SOLN
500.0000 mL | INTRAVENOUS | Status: DC
Start: 2013-06-04 — End: 2013-06-04

## 2013-06-04 NOTE — Op Note (Signed)
[facility]  [address] [place], [address]   COLONOSCOPY PROCEDURE REPORT  PATIENT: Leslie, [name]  MR#: [id number] BIRTHDATE: 09-May-1955 , 26  yrs. old GENDER: Female ENDOSCOPIST: Gatha Mayer, MD, [facility] PROCEDURE DATE:  06/04/2013 PROCEDURE:   Colonoscopy with biopsy and snare polypectomy First Screening Colonoscopy - Avg.  risk and is 50 yrs.  old or older Yes.  Prior Negative Screening - Now for repeat screening. N/A  History of Adenoma - Now for follow-up colonoscopy & has been > or = to 3 yrs.  N/A  Polyps Removed Today? Yes. ASA CLASS:   Class II INDICATIONS:average risk screening and first colonoscopy. MEDICATIONS: Propofol (Diprivan) 260 mg IV, MAC sedation, administered by CRNA, and These medications were titrated to patient response per physician's verbal order  DESCRIPTION OF PROCEDURE:   After the risks benefits and alternatives of the procedure were thoroughly explained, informed consent was obtained.  A digital rectal exam revealed no abnormalities of the rectum.   The LB [id number]  endoscope was introduced through the anus and advanced to the cecum, which was identified by both the appendix and ileocecal valve. No adverse events experienced.   The quality of the prep was Suprep good  The instrument was then slowly withdrawn as the colon was fully examined.  COLON FINDINGS: Two sessile polyps measuring 2 and 8 mm in size were found in the ascending colon and transverse colon.  A polypectomy was performed with cold forceps and with a cold snare.  The resection was complete and the polyp tissue was completely retrieved.   The colon mucosa was otherwise normal.   A right colon retroflexion was performed.  Retroflexed views revealed no abnormalities. The time to cecum=2 minutes 53 seconds.  Withdrawal time=12 minutes 46 seconds.  The scope was withdrawn and the procedure completed. COMPLICATIONS: There were no  complications.  ENDOSCOPIC IMPRESSION: 1.   Two sessile polyps measuring 2 and 8 mm in size were found in the ascending colon and transverse colon; polypectomy was performed with cold forceps and with a cold snare 2.   The colon mucosa was otherwise normal - good prep - first screening  RECOMMENDATIONS: Timing of repeat colonoscopy will be determined by pathology findings.   eSigned:  Gatha Mayer, MD, [facility] 06/04/2013 8:57 AM   cc: The Patient  and Camille Bal, MD

## 2013-06-04 NOTE — Patient Instructions (Addendum)
I found and removed two polyps that look benign (not cancer).  I will let you know pathology results and when to have another routine colonoscopy by mail.  I appreciate the opportunity to care for you. Gatha Mayer, MD, FACG   YOU HAD AN ENDOSCOPIC PROCEDURE TODAY AT [facility]: Refer to the procedure report that was given to you for any specific questions about what was found during the examination.  If the procedure report does not answer your questions, please call your gastroenterologist to clarify.  If you requested that your care partner not be given the details of your procedure findings, then the procedure report has been included in a sealed envelope for you to review at your convenience later.  YOU SHOULD EXPECT: Some feelings of bloating in the abdomen. Passage of more gas than usual.  Walking can help get rid of the air that was put into your GI tract during the procedure and reduce the bloating. If you had a lower endoscopy (such as a colonoscopy or flexible sigmoidoscopy) you may notice spotting of blood in your stool or on the toilet paper. If you underwent a bowel prep for your procedure, then you may not have a normal bowel movement for a few days.  DIET: Your first meal following the procedure should be a light meal and then it is ok to progress to your normal diet.  A half-sandwich or bowl of soup is an example of a good first meal.  Heavy or fried foods are harder to digest and may make you feel nauseous or bloated.  Likewise meals heavy in dairy and vegetables can cause extra gas to form and this can also increase the bloating.  Drink plenty of fluids but you should avoid alcoholic beverages for 24 hours.  ACTIVITY: Your care partner should take you home directly after the procedure.  You should plan to take it easy, moving slowly for the rest of the day.  You can resume normal activity the day after the procedure however you should NOT DRIVE or use heavy  machinery for 24 hours (because of the sedation medicines used during the test).    SYMPTOMS TO REPORT IMMEDIATELY: A gastroenterologist can be reached at any hour.  During normal business hours, 8:30 AM to 5:00 PM Monday through Friday, call [phone].  After hours and on weekends, please call the GI answering service at [phone] who will take a message and have the physician on call contact you.   Following lower endoscopy (colonoscopy or flexible sigmoidoscopy):  Excessive amounts of blood in the stool  Significant tenderness or worsening of abdominal pains  Swelling of the abdomen that is new, acute  Fever of 100F or higher    FOLLOW UP: If any biopsies were taken you will be contacted by phone or by letter within the next 1-3 weeks.  Call your gastroenterologist if you have not heard about the biopsies in 3 weeks.  Our staff will call the home number listed on your records the next business day following your procedure to check on you and address any questions or concerns that you may have at that time regarding the information given to you following your procedure. This is a courtesy call and so if there is no answer at the home number and we have not heard from you through the emergency physician on call, we will assume that you have returned to your regular daily activities without incident.  SIGNATURES/CONFIDENTIALITY: You and/or  your care partner have signed paperwork which will be entered into your electronic medical record.  These signatures attest to the fact that that the information above on your After Visit Summary has been reviewed and is understood.  Full responsibility of the confidentiality of this discharge information lies with you and/or your care-partner.   Information on polyps given to you today

## 2013-06-04 NOTE — Progress Notes (Signed)
Proce3dure ends, to recovery, report given and VSS.

## 2013-06-04 NOTE — Progress Notes (Signed)
Called to room to assist during endoscopic procedure.  Patient ID and intended procedure confirmed with present staff. Received instructions for my participation in the procedure from the performing physician.  

## 2013-06-05 ENCOUNTER — Ambulatory Visit (INDEPENDENT_AMBULATORY_CARE_PROVIDER_SITE_OTHER): Payer: 59 | Admitting: Endocrinology

## 2013-06-05 ENCOUNTER — Encounter: Payer: Self-pay | Admitting: Endocrinology

## 2013-06-05 ENCOUNTER — Telehealth: Payer: Self-pay | Admitting: *Deleted

## 2013-06-05 VITALS — BP 108/74 | HR 79 | Temp 98.7°F | Resp 12 | Wt 238.0 lb

## 2013-06-05 DIAGNOSIS — E039 Hypothyroidism, unspecified: Secondary | ICD-10-CM

## 2013-06-05 DIAGNOSIS — E7800 Pure hypercholesterolemia, unspecified: Secondary | ICD-10-CM

## 2013-06-05 NOTE — Telephone Encounter (Signed)
Message left

## 2013-06-05 NOTE — Progress Notes (Signed)
Patient ID: Leslie [name], female   DOB: 22-Nov-1955, 58 y.o.   MRN: [id number]   Reason for Appointment:  Hypothyroidism, followup visit   History of Present Illness:   The Hyothyroidism was first diagnosed in 1990   The symptoms consistent with hypothyroidism initially were: A weight gain of 60-70 pounds, feeling of depression,  fatigue, heavy periods, cold sensitivity , difficulty concentrating, dry skin, hair loss and vertigo. Apparently her symptoms had gone on for up to 3 years before she was diagnosed. Detailed records are not available. Although initially she was treated only with 25 mcg she was subsequently followed by an endocrinologist who had increased her dose to 150 mcg With the treatment she did feel overall better but  continued to feel somewhat tired and had difficulty losing any weight  She was followed by the endocrinologist until early 2014 and was taking 150 mcg before she ran out She was unable to get her prescription refilled in 7/14 because of lack of insurance With this her symptoms of hypothyroidism returned and she had most of the above symptoms including fatigue, weight gain and depression           She was finally reevaluated in 11/14 at the indigent local clinic and at that time with her TSH of 201.6.  She was started on 50 mcg of Synthroid and on her initial consultation was taking 75 mcg  She is generally compliant with taking the medication in the morning  Recent history: On her initial consultation she was having symptoms of significant fatigue and some depression, dry skin and hair loss With increasing her dose back to 150 mcg she has felt much better with these symptoms but is still having some hair loss. Also has better nails She is quite compliant with her medication daily and is asking about generic versus brand name  LABS:  Appointment on 05/31/2013  Component Date Value Ref Range Status  . TSH 05/31/2013 0.58  0.35 - 5.50 uIU/mL Final  . Free T4  05/31/2013 1.66* 0.60 - 1.60 ng/dL Final    Past Medical History  Diagnosis Date  . Thyroid disease     Past Surgical History  Procedure Laterality Date  . Wisdom tooth extraction    . Laparoscopic gastric banding with hiatal hernia repair  7 yrs ago  . Colonoscopy w/ polypectomy      2 benign polyps removed    Family History  Problem Relation Age of Onset  . Heart attack Mother   . Heart attack Father   . Diabetes Neg Hx   . Hyperlipidemia Neg Hx   . Hypertension Neg Hx   . Sudden death Neg Hx   . Colon cancer Maternal Grandmother    Father mild hypo  Social History:  reports that she has never smoked. She has never used smokeless tobacco. She reports that she drinks about 1.8 ounces of alcohol per week. She reports that she does not use illicit drugs.  Allergies: No Known Allergies    Medication List       This list is accurate as of: 06/05/13 11:27 AM.  Always use your most recent med list.               aspirin EC 81 MG tablet  Take 1 tablet (81 mg total) by mouth daily.     levothyroxine 150 MCG tablet  Commonly known as:  SYNTHROID  Take 1 tablet (150 mcg total) by mouth daily before breakfast.  Review of Systems:  She has had difficulty with weight loss. She was previously was using Recumbent bike and has started doing a little no  Wt Readings from Last 3 Encounters:  06/05/13 238 lb (107.956 kg)  06/04/13 240 lb (108.863 kg)  05/23/13 240 lb (108.863 kg)     No  history of Diabetes.      Hyperlipidemia: She had marked increase in cholesterol levels in 2014. However she thinks that her cholesterol levels are usually nearly normal when she has control of her hypothyroidism. She was given Lipitor 40 mg daily to try but since she had an episode of reflux with this she did not try it again and is not taking it  Hands had been numb for about 1 year especially in the right side. This is mostly positional if she is resting on it.  Symptoms are  now much better    Examination:    BP 108/74  Pulse 79  Temp(Src) 98.7 F (37.1 C) (Oral)  Resp 12  Wt 238 lb (107.956 kg)  SpO2 96%   General Appearance: pleasant, has generalized obesity. Her face appears mildly puffy         Neck: The thyroid is nonpalpable      Neurological: REFLEXES: at biceps are show  slightly slow relaxation  Skin:  mildly dry   Assessments  Primary hypothyroidism, long-standing with significant symptoms and TSH of over 200 prior to resuming supplementation  and 2014  Subjectively she is feeling much better with going back to her original dose of 150 mcg Also her TSH is back to normal although on the low side at 0.58 along with mild increase in free T4  HYPERLIPIDEMIA: This  was  likely to be from  severe hypothyroidism and may resolve with correction of the hypothyroidism  Treatment:   She will take a half tablet once a week otherwise continue on 150 mcg daily  She can continue to take generic medication as long as she tries to get the same manufacturer from her [facility] pharmacy Followup lipids on next visit  Pasquale Matters 06/05/2013, 11:27 AM

## 2013-06-05 NOTE — Patient Instructions (Signed)
Take 1/2 tab on 1 day of the week

## 2013-06-07 ENCOUNTER — Ambulatory Visit (INDEPENDENT_AMBULATORY_CARE_PROVIDER_SITE_OTHER): Payer: 59 | Admitting: Family Medicine

## 2013-06-07 ENCOUNTER — Encounter: Payer: Self-pay | Admitting: Family Medicine

## 2013-06-07 VITALS — BP 120/76 | HR 87 | Temp 98.4°F | Resp 16 | Ht 61.5 in | Wt 239.2 lb

## 2013-06-07 DIAGNOSIS — H6591 Unspecified nonsuppurative otitis media, right ear: Secondary | ICD-10-CM

## 2013-06-07 DIAGNOSIS — H6590 Unspecified nonsuppurative otitis media, unspecified ear: Secondary | ICD-10-CM

## 2013-06-07 DIAGNOSIS — J0100 Acute maxillary sinusitis, unspecified: Secondary | ICD-10-CM

## 2013-06-07 MED ORDER — AMOXICILLIN-POT CLAVULANATE 875-125 MG PO TABS: 1.0000 | ORAL_TABLET | Freq: Two times a day (BID) | ORAL | Status: DC

## 2013-06-07 NOTE — Progress Notes (Signed)
Subjective:    Patient ID: Leslie [name], female    DOB: 1955/05/07, 58 y.o.   MRN: [id number] This chart was scribed for Meredith Staggers, MD by Danella Maiers, ED Scribe. This patient was seen in room 3 and the patient's care was started at 4:42 PM.  Chief Complaint  Patient presents with  . Cough    sneezing sore throat ears ache symptoms x 1 week    HPI HPI Comments: Leslie [name] is a 58 y.o. female who presents to the [facility] complaining of gradually-worsening cough, sneezing, congestion, sinus pain, toothaches, sore throat, and bilateral ear pain, worse on the right, for the past 6 days. She reports her cough occasionally produces green phlegm. She had a colonoscopy 3 days ago and states the cold symptoms worsened significantly afterward. She denies fevers, SOB. She reports prior h/o sinus infection long ago and states this feels the same. She has tried Mucinex and Flonase with no relief.   PCP Nilda Simmer, MD  Patient Active Problem List   Diagnosis Date Noted  . Injury of left elbow 02/13/2013  . Unspecified hypothyroidism 02/01/2013   Past Medical History  Diagnosis Date  . Thyroid disease    Past Surgical History  Procedure Laterality Date  . Wisdom tooth extraction    . Laparoscopic gastric banding with hiatal hernia repair  7 yrs ago  . Colonoscopy w/ polypectomy      2 benign polyps removed   No Known Allergies Prior to Admission medications   Medication Sig Start Date End Date Taking? Authorizing Provider  acetaminophen (TYLENOL) 325 MG tablet Take 650 mg by mouth every 6 (six) hours as needed.   Yes Historical Provider, MD  dextromethorphan-guaiFENesin (MUCINEX DM) 30-600 MG per 12 hr tablet Take 1 tablet by mouth 2 (two) times daily.   Yes Historical Provider, MD  levothyroxine (SYNTHROID) 150 MCG tablet Take 1 tablet (150 mcg total) by mouth daily before breakfast. 04/02/13  Yes Reather Littler, MD  aspirin EC 81 MG tablet Take 1 tablet  (81 mg total) by mouth daily. 03/26/13   Richarda Overlie, MD   History  Substance Use Topics  . Smoking status: Never Smoker   . Smokeless tobacco: Never Used  . Alcohol Use: 1.8 oz/week    3 Glasses of wine per week      Review of Systems  Constitutional: Negative for fever.  HENT: Positive for congestion, ear pain, rhinorrhea, sinus pressure, sneezing and sore throat.   Respiratory: Positive for cough. Negative for shortness of breath.        Objective:   Physical Exam  Vitals reviewed. Constitutional: She is oriented to person, place, and time. She appears well-developed and well-nourished. No distress.  HENT:  Head: Normocephalic and atraumatic.  Right Ear: Hearing and external ear normal. Tympanic membrane is erythematous (minimal). A middle ear effusion (yellow) is present.  Left Ear: Hearing and external ear normal. Tympanic membrane is not erythematous. A middle ear effusion (mild clear) is present.  Nose: Right sinus exhibits maxillary sinus tenderness (slight). Left sinus exhibits maxillary sinus tenderness (slight).  Mouth/Throat: Oropharynx is clear and moist. No oropharyngeal exudate.  Small amount of yellow discharge in bilateral nares. No LAD.  Eyes: Conjunctivae and EOM are normal. Pupils are equal, round, and reactive to light.  Cardiovascular: Normal rate, regular rhythm, normal heart sounds and intact distal pulses.   No murmur heard. Pulmonary/Chest: Effort normal and breath sounds normal. No respiratory distress. She has no  wheezes. She has no rhonchi.  Lymphadenopathy:    She has no cervical adenopathy.  Neurological: She is alert and oriented to person, place, and time.  Skin: Skin is warm and dry. No rash noted.  Psychiatric: She has a normal mood and affect. Her behavior is normal.     Filed Vitals:   06/07/13 1637  BP: 120/76  Pulse: 87  Temp: 98.4 F (36.9 C)  TempSrc: Oral  Resp: 16  Height: 5' 1.5" (1.562 m)  Weight: 239 lb 3.2 oz (108.5 kg)    SpO2: 99%        Assessment & Plan:   Leslie [name] is a 58 y.o. female Sinusitis, acute maxillary - Plan: amoxicillin-clavulanate (AUGMENTIN) 875-125 MG per tablet  Right otitis media with effusion  Initial URI, now with secondary sickening  -  sinusitis and R otitis media/effusion.  Saline ns, mucinex, sx care, start Augmentin, rtc precautions.   Meds ordered this encounter  Medications  . dextromethorphan-guaiFENesin (MUCINEX DM) 30-600 MG per 12 hr tablet    Sig: Take 1 tablet by mouth 2 (two) times daily.  Marland Kitchen acetaminophen (TYLENOL) 325 MG tablet    Sig: Take 650 mg by mouth every 6 (six) hours as needed.  Marland Kitchen amoxicillin-clavulanate (AUGMENTIN) 875-125 MG per tablet    Sig: Take 1 tablet by mouth 2 (two) times daily.    Dispense:  20 tablet    Refill:  0   Patient Instructions  Saline nasal spray atleast 4 times per day, over the counter mucinex, drink plenty of fluids. Return to the clinic or go to the nearest emergency room if any of your symptoms worsen or new symptoms occur.   Sinusitis Sinusitis is redness, soreness, and swelling (inflammation) of the paranasal sinuses. Paranasal sinuses are air pockets within the bones of your face (beneath the eyes, the middle of the forehead, or above the eyes). In healthy paranasal sinuses, mucus is able to drain out, and air is able to circulate through them by way of your nose. However, when your paranasal sinuses are inflamed, mucus and air can become trapped. This can allow bacteria and other germs to grow and cause infection. Sinusitis can develop quickly and last only a short time (acute) or continue over a long period (chronic). Sinusitis that lasts for more than 12 weeks is considered chronic.  CAUSES  Causes of sinusitis include:  Allergies.  Structural abnormalities, such as displacement of the cartilage that separates your nostrils (deviated septum), which can decrease the air flow through your nose and sinuses and affect  sinus drainage.  Functional abnormalities, such as when the small hairs (cilia) that line your sinuses and help remove mucus do not work properly or are not present. SYMPTOMS  Symptoms of acute and chronic sinusitis are the same. The primary symptoms are pain and pressure around the affected sinuses. Other symptoms include:  Upper toothache.  Earache.  Headache.  Bad breath.  Decreased sense of smell and taste.  A cough, which worsens when you are lying flat.  Fatigue.  Fever.  Thick drainage from your nose, which often is green and may contain pus (purulent).  Swelling and warmth over the affected sinuses. DIAGNOSIS  Your caregiver will perform a physical exam. During the exam, your caregiver may:  Look in your nose for signs of abnormal growths in your nostrils (nasal polyps).  Tap over the affected sinus to check for signs of infection.  View the inside of your sinuses (endoscopy) with a special  imaging device with a light attached (endoscope), which is inserted into your sinuses. If your caregiver suspects that you have chronic sinusitis, one or more of the following tests may be recommended:  Allergy tests.  Nasal culture A sample of mucus is taken from your nose and sent to a lab and screened for bacteria.  Nasal cytology A sample of mucus is taken from your nose and examined by your caregiver to determine if your sinusitis is related to an allergy. TREATMENT  Most cases of acute sinusitis are related to a viral infection and will resolve on their own within 10 days. Sometimes medicines are prescribed to help relieve symptoms (pain medicine, decongestants, nasal steroid sprays, or saline sprays).  However, for sinusitis related to a bacterial infection, your caregiver will prescribe antibiotic medicines. These are medicines that will help kill the bacteria causing the infection.  Rarely, sinusitis is caused by a fungal infection. In theses cases, your caregiver will  prescribe antifungal medicine. For some cases of chronic sinusitis, surgery is needed. Generally, these are cases in which sinusitis recurs more than 3 times per year, despite other treatments. HOME CARE INSTRUCTIONS   Drink plenty of water. Water helps thin the mucus so your sinuses can drain more easily.  Use a humidifier.  Inhale steam 3 to 4 times a day (for example, sit in the bathroom with the shower running).  Apply a warm, moist washcloth to your face 3 to 4 times a day, or as directed by your caregiver.  Use saline nasal sprays to help moisten and clean your sinuses.  Take over-the-counter or prescription medicines for pain, discomfort, or fever only as directed by your caregiver. SEEK IMMEDIATE MEDICAL CARE IF:  You have increasing pain or severe headaches.  You have nausea, vomiting, or drowsiness.  You have swelling around your face.  You have vision problems.  You have a stiff neck.  You have difficulty breathing. MAKE SURE YOU:   Understand these instructions.  Will watch your condition.  Will get help right away if you are not doing well or get worse. Document Released: 03/08/2005 Document Revised: 05/31/2011 Document Reviewed: 03/23/2011 [facility] Patient Information 2014 [place], [place].  Otitis Media With Effusion Otitis media with effusion is the presence of fluid in the middle ear. This is a common problem in children, which often follows ear infections. It may be present for weeks or longer after the infection. Unlike an acute ear infection, otitis media with effusion refers only to fluid behind the ear drum and not infection. Children with repeated ear and sinus infections and allergy problems are the most likely to get otitis media with effusion. CAUSES  The most frequent cause of the fluid buildup is dysfunction of the eustachian tubes. These are the tubes that drain fluid in the ears to the to the back of the nose (nasopharynx). SYMPTOMS   The main  symptom of this condition is hearing loss. As a result, you or your child may:  Listen to the TV at a loud volume.  Not respond to questions.  Ask "what" often when spoken to.  Mistake or confuse on sound or word for another.  There may be a sensation of fullness or pressure but usually not pain. DIAGNOSIS   Your health care provider will diagnose this condition by examining you or your child's ears.  Your health care provider may test the pressure in you or your child's ear with a tympanometer.  A hearing test may be conducted if  the problem persists. TREATMENT   Treatment depends on the duration and the effects of the effusion.  Antibiotics, decongestants, nose drops, and cortisone-type drugs (tablets or nasal spray) may not be helpful.  Children with persistent ear effusions may have delayed language or behavioral problems. Children at risk for developmental delays in hearing, learning, and speech may require referral to a specialist earlier than children not at risk.  You or your child's health care provider may suggest a referral to an ear, nose, and throat surgeon for treatment. The following may help restore normal hearing:  Drainage of fluid.  Placement of ear tubes (tympanostomy tubes).  Removal of adenoids (adenoidectomy). HOME CARE INSTRUCTIONS   Avoid second hand smoke.  Infants who are breast fed are less likely to have this condition.  Avoid feeding infants while laying flat.  Avoid known environmental allergens.  Avoid people who are sick. SEEK MEDICAL CARE IF:   Hearing is not better in 3 months.  Hearing is worse.  Ear pain.  Drainage from the ear.  Dizziness. MAKE SURE YOU:   Understand these instructions.  Will watch your condition.  Will get help right away if you are not doing well or get worse. Document Released: 04/15/2004 Document Revised: 12/27/2012 Document Reviewed: 10/03/2012 [facility] Patient Information 2014 [place],  [place].     I personally performed the services described in this documentation, which was scribed in my presence. The recorded information has been reviewed and considered, and addended by me as needed.

## 2013-06-07 NOTE — Patient Instructions (Addendum)
Saline nasal spray atleast 4 times per day, over the counter mucinex, drink plenty of fluids. Return to the clinic or go to the nearest emergency room if any of your symptoms worsen or new symptoms occur.   Sinusitis Sinusitis is redness, soreness, and swelling (inflammation) of the paranasal sinuses. Paranasal sinuses are air pockets within the bones of your face (beneath the eyes, the middle of the forehead, or above the eyes). In healthy paranasal sinuses, mucus is able to drain out, and air is able to circulate through them by way of your nose. However, when your paranasal sinuses are inflamed, mucus and air can become trapped. This can allow bacteria and other germs to grow and cause infection. Sinusitis can develop quickly and last only a short time (acute) or continue over a long period (chronic). Sinusitis that lasts for more than 12 weeks is considered chronic.  CAUSES  Causes of sinusitis include:  Allergies.  Structural abnormalities, such as displacement of the cartilage that separates your nostrils (deviated septum), which can decrease the air flow through your nose and sinuses and affect sinus drainage.  Functional abnormalities, such as when the small hairs (cilia) that line your sinuses and help remove mucus do not work properly or are not present. SYMPTOMS  Symptoms of acute and chronic sinusitis are the same. The primary symptoms are pain and pressure around the affected sinuses. Other symptoms include:  Upper toothache.  Earache.  Headache.  Bad breath.  Decreased sense of smell and taste.  A cough, which worsens when you are lying flat.  Fatigue.  Fever.  Thick drainage from your nose, which often is green and may contain pus (purulent).  Swelling and warmth over the affected sinuses. DIAGNOSIS  Your caregiver will perform a physical exam. During the exam, your caregiver may:  Look in your nose for signs of abnormal growths in your nostrils (nasal  polyps).  Tap over the affected sinus to check for signs of infection.  View the inside of your sinuses (endoscopy) with a special imaging device with a light attached (endoscope), which is inserted into your sinuses. If your caregiver suspects that you have chronic sinusitis, one or more of the following tests may be recommended:  Allergy tests.  Nasal culture A sample of mucus is taken from your nose and sent to a lab and screened for bacteria.  Nasal cytology A sample of mucus is taken from your nose and examined by your caregiver to determine if your sinusitis is related to an allergy. TREATMENT  Most cases of acute sinusitis are related to a viral infection and will resolve on their own within 10 days. Sometimes medicines are prescribed to help relieve symptoms (pain medicine, decongestants, nasal steroid sprays, or saline sprays).  However, for sinusitis related to a bacterial infection, your caregiver will prescribe antibiotic medicines. These are medicines that will help kill the bacteria causing the infection.  Rarely, sinusitis is caused by a fungal infection. In theses cases, your caregiver will prescribe antifungal medicine. For some cases of chronic sinusitis, surgery is needed. Generally, these are cases in which sinusitis recurs more than 3 times per year, despite other treatments. HOME CARE INSTRUCTIONS   Drink plenty of water. Water helps thin the mucus so your sinuses can drain more easily.  Use a humidifier.  Inhale steam 3 to 4 times a day (for example, sit in the bathroom with the shower running).  Apply a warm, moist washcloth to your face 3 to 4 times a  day, or as directed by your caregiver.  Use saline nasal sprays to help moisten and clean your sinuses.  Take over-the-counter or prescription medicines for pain, discomfort, or fever only as directed by your caregiver. SEEK IMMEDIATE MEDICAL CARE IF:  You have increasing pain or severe headaches.  You have  nausea, vomiting, or drowsiness.  You have swelling around your face.  You have vision problems.  You have a stiff neck.  You have difficulty breathing. MAKE SURE YOU:   Understand these instructions.  Will watch your condition.  Will get help right away if you are not doing well or get worse. Document Released: 03/08/2005 Document Revised: 05/31/2011 Document Reviewed: 03/23/2011 [facility] Patient Information 2014 [place], [place].  Otitis Media With Effusion Otitis media with effusion is the presence of fluid in the middle ear. This is a common problem in children, which often follows ear infections. It may be present for weeks or longer after the infection. Unlike an acute ear infection, otitis media with effusion refers only to fluid behind the ear drum and not infection. Children with repeated ear and sinus infections and allergy problems are the most likely to get otitis media with effusion. CAUSES  The most frequent cause of the fluid buildup is dysfunction of the eustachian tubes. These are the tubes that drain fluid in the ears to the to the back of the nose (nasopharynx). SYMPTOMS   The main symptom of this condition is hearing loss. As a result, you or your child may:  Listen to the TV at a loud volume.  Not respond to questions.  Ask "what" often when spoken to.  Mistake or confuse on sound or word for another.  There may be a sensation of fullness or pressure but usually not pain. DIAGNOSIS   Your health care provider will diagnose this condition by examining you or your child's ears.  Your health care provider may test the pressure in you or your child's ear with a tympanometer.  A hearing test may be conducted if the problem persists. TREATMENT   Treatment depends on the duration and the effects of the effusion.  Antibiotics, decongestants, nose drops, and cortisone-type drugs (tablets or nasal spray) may not be helpful.  Children with persistent ear  effusions may have delayed language or behavioral problems. Children at risk for developmental delays in hearing, learning, and speech may require referral to a specialist earlier than children not at risk.  You or your child's health care provider may suggest a referral to an ear, nose, and throat surgeon for treatment. The following may help restore normal hearing:  Drainage of fluid.  Placement of ear tubes (tympanostomy tubes).  Removal of adenoids (adenoidectomy). HOME CARE INSTRUCTIONS   Avoid second hand smoke.  Infants who are breast fed are less likely to have this condition.  Avoid feeding infants while laying flat.  Avoid known environmental allergens.  Avoid people who are sick. SEEK MEDICAL CARE IF:   Hearing is not better in 3 months.  Hearing is worse.  Ear pain.  Drainage from the ear.  Dizziness. MAKE SURE YOU:   Understand these instructions.  Will watch your condition.  Will get help right away if you are not doing well or get worse. Document Released: 04/15/2004 Document Revised: 12/27/2012 Document Reviewed: 10/03/2012 [facility] Patient Information 2014 [place], [place].

## 2013-06-12 ENCOUNTER — Encounter: Payer: Self-pay | Admitting: Internal Medicine

## 2013-06-12 DIAGNOSIS — Z860100 Personal history of colon polyps, unspecified: Secondary | ICD-10-CM | POA: Insufficient documentation

## 2013-06-12 HISTORY — DX: Personal history of colonic polyps: Z86.010

## 2013-06-12 NOTE — Progress Notes (Signed)
Quick Note:  subcentimeter adenoma and ssp w/o dysplasia Repeat colon 2020 ______

## 2013-06-25 ENCOUNTER — Ambulatory Visit: Payer: 59 | Attending: Internal Medicine | Admitting: Internal Medicine

## 2013-06-25 ENCOUNTER — Encounter: Payer: Self-pay | Admitting: Internal Medicine

## 2013-06-25 VITALS — BP 130/78 | HR 90 | Temp 97.8°F | Resp 16 | Wt 240.0 lb

## 2013-06-25 DIAGNOSIS — Z09 Encounter for follow-up examination after completed treatment for conditions other than malignant neoplasm: Secondary | ICD-10-CM

## 2013-06-25 DIAGNOSIS — E039 Hypothyroidism, unspecified: Secondary | ICD-10-CM

## 2013-06-25 DIAGNOSIS — J329 Chronic sinusitis, unspecified: Secondary | ICD-10-CM

## 2013-06-25 DIAGNOSIS — J3489 Other specified disorders of nose and nasal sinuses: Secondary | ICD-10-CM | POA: Insufficient documentation

## 2013-06-25 DIAGNOSIS — R0981 Nasal congestion: Secondary | ICD-10-CM | POA: Insufficient documentation

## 2013-06-25 DIAGNOSIS — Z139 Encounter for screening, unspecified: Secondary | ICD-10-CM

## 2013-06-25 DIAGNOSIS — E0500 Thyrotoxicosis with diffuse goiter without thyrotoxic crisis or storm: Secondary | ICD-10-CM | POA: Insufficient documentation

## 2013-06-25 MED ORDER — FLUTICASONE PROPIONATE 50 MCG/ACT NA SUSP: 2.0000 | Freq: Every day | NASAL | Status: DC

## 2013-06-25 NOTE — Progress Notes (Signed)
MRN: [id number] Name: Leslie [name]  Sex: female Age: 58 y.o. DOB: Mar 20, 1956  Allergies: Review of patient's allergies indicates no known allergies.  Chief Complaint  Patient presents with  . Follow-up    HPI: Patient is 58 y.o. female who comes today for followup, history of hypothyroidism she has been following up with endocrinologist and is taking levothyroxine, patient was recently treated for sinusitis patient still reported to have some nasal congestion denies any fever chills sore throat chest pain or shortness of breath, she does not smoke cigarettes .  Past Medical History  Diagnosis Date  . Thyroid disease   . Personal history of colonic polyps -adeanoma, sessile serrated polyp 06/12/2013    Past Surgical History  Procedure Laterality Date  . Wisdom tooth extraction    . Laparoscopic gastric banding with hiatal hernia repair  7 yrs ago  . Colonoscopy w/ polypectomy      2 benign polyps removed      Medication List       This list is accurate as of: 06/25/13 11:24 AM.  Always use your most recent med list.               acetaminophen 325 MG tablet  Commonly known as:  TYLENOL  Take 650 mg by mouth every 6 (six) hours as needed.     amoxicillin-clavulanate 875-125 MG per tablet  Commonly known as:  AUGMENTIN  Take 1 tablet by mouth 2 (two) times daily.     aspirin EC 81 MG tablet  Take 1 tablet (81 mg total) by mouth daily.     dextromethorphan-guaiFENesin 30-600 MG per 12 hr tablet  Commonly known as:  MUCINEX DM  Take 1 tablet by mouth 2 (two) times daily.     fluticasone 50 MCG/ACT nasal spray  Commonly known as:  FLONASE  Place 2 sprays into both nostrils daily.     levothyroxine 150 MCG tablet  Commonly known as:  SYNTHROID  Take 1 tablet (150 mcg total) by mouth daily before breakfast.        Meds ordered this encounter  Medications  . fluticasone (FLONASE) 50 MCG/ACT nasal spray    Sig: Place 2 sprays into both nostrils daily.   Dispense:  16 g    Refill:  6    Immunization History  Administered Date(s) Administered  . Influenza Split 02/01/2013    Family History  Problem Relation Age of Onset  . Heart attack Mother   . Heart attack Father   . Diabetes Neg Hx   . Hyperlipidemia Neg Hx   . Hypertension Neg Hx   . Sudden death Neg Hx   . Colon cancer Maternal Grandmother     History  Substance Use Topics  . Smoking status: Never Smoker   . Smokeless tobacco: Never Used  . Alcohol Use: 1.8 oz/week    3 Glasses of wine per week    Review of Systems   As noted in HPI  Filed Vitals:   06/25/13 1044  BP: 130/78  Pulse: 90  Temp: 97.8 F (36.6 C)  Resp: 16    Physical Exam  Physical Exam  Constitutional: No distress.  HENT:  Some nasal condition no sinus tenderness  Eyes: EOM are normal. Pupils are equal, round, and reactive to light.  Cardiovascular: Normal rate and regular rhythm.   Pulmonary/Chest: Breath sounds normal. No respiratory distress. She has no wheezes. She has no rales.    CBC    Component Value  Date/Time   WBC 6.0 02/01/2013 1139   RBC 4.73 02/01/2013 1139   HGB 13.1 02/01/2013 1139   HCT 37.6 02/01/2013 1139   PLT 278 02/01/2013 1139   MCV 79.5 02/01/2013 1139    CMP     Component Value Date/Time   NA 135 02/01/2013 1139   K 4.2 02/01/2013 1139   CL 97 02/01/2013 1139   CO2 28 02/01/2013 1139   GLUCOSE 88 02/01/2013 1139   BUN 15 02/01/2013 1139   CREATININE 1.08 02/01/2013 1139   CALCIUM 9.5 02/01/2013 1139   PROT 7.4 02/01/2013 1139   ALBUMIN 4.3 02/01/2013 1139   AST 17 02/01/2013 1139   ALT 10 02/01/2013 1139   ALKPHOS 51 02/01/2013 1139   BILITOT 0.5 02/01/2013 1139   GFRNONAA 57* 02/01/2013 1139   GFRAA 66 02/01/2013 1139    Lab Results  Component Value Date/Time   CHOL 297* 03/26/2013 10:38 AM    No components found with this basename: hga1c    Lab Results  Component Value Date/Time   AST 17 02/01/2013 11:39 AM    Assessment and  Plan  Follow up  Nasal congestion - Plan: fluticasone (FLONASE) 50 MCG/ACT nasal spray Advised patient for saltwater gargles  Sinusitis Patient already completed a course of antibiotic  Unspecified hypothyroidism On levothyroxine following up with her endocrinologist  Screening - Plan: MM DIGITAL SCREENING BILATERAL, Ambulatory referral to Obstetrics / Gynecology   Health Maintenance  -Pap Smear: referred to GYN  -Mammogram: ordered    Return in about 4 months (around 10/25/2013).  Doris Cheadle, MD

## 2013-06-25 NOTE — Progress Notes (Signed)
Patient here for follow up Was recently diagnosed with a sinus infection Finished the antibiotics but still complains of clogged ears Congestion-just not feeling 100%

## 2013-06-27 NOTE — Addendum Note (Signed)
Addended by: Lowry Ram on: 06/27/2013 04:12 PM   Modules accepted: Level of Service

## 2013-07-05 ENCOUNTER — Other Ambulatory Visit: Payer: Self-pay | Admitting: Internal Medicine

## 2013-07-05 ENCOUNTER — Ambulatory Visit (HOSPITAL_COMMUNITY)
Admission: RE | Admit: 2013-07-05 | Discharge: 2013-07-05 | Disposition: A | Discharge status: Home / Self Care | Payer: 59 | Source: Ambulatory Visit | Attending: Internal Medicine | Admitting: Internal Medicine

## 2013-07-05 DIAGNOSIS — Z139 Encounter for screening, unspecified: Secondary | ICD-10-CM

## 2013-07-05 DIAGNOSIS — Z1231 Encounter for screening mammogram for malignant neoplasm of breast: Secondary | ICD-10-CM

## 2013-08-06 ENCOUNTER — Telehealth: Payer: Self-pay | Admitting: *Deleted

## 2013-08-06 ENCOUNTER — Encounter: Payer: 59 | Admitting: Obstetrics & Gynecology

## 2013-08-06 NOTE — Telephone Encounter (Addendum)
Patient called nurse line and left message that she came to her appointment today and checked in at the kiosk and saw that she was scheduled with a female provider. She went to window but staff at desk were very busy so she decided to leave. She called to speak with the front desk and was put on hold for 15 minutes. She hung up and called back to the nurse line. She was unhappy about her visit in our clinic and doesn't wish to reschedule.   I attempted to call patient back but got no answer.

## 2013-08-07 NOTE — Progress Notes (Signed)
This encounter was created in error - please disregard.

## 2013-08-27 ENCOUNTER — Telehealth: Payer: Self-pay | Admitting: Internal Medicine

## 2013-08-31 ENCOUNTER — Other Ambulatory Visit: Payer: Self-pay | Admitting: Emergency Medicine

## 2013-08-31 DIAGNOSIS — E039 Hypothyroidism, unspecified: Secondary | ICD-10-CM

## 2013-09-05 ENCOUNTER — Ambulatory Visit: Payer: 59 | Admitting: Endocrinology

## 2013-10-23 ENCOUNTER — Other Ambulatory Visit: Payer: Self-pay | Admitting: *Deleted

## 2013-10-23 MED ORDER — LEVOTHYROXINE SODIUM 150 MCG PO TABS: 150.0000 ug | ORAL_TABLET | Freq: Every day | ORAL | Status: DC

## 2013-11-16 ENCOUNTER — Other Ambulatory Visit: Payer: Self-pay | Admitting: Endocrinology

## 2013-11-19 ENCOUNTER — Telehealth: Payer: Self-pay | Admitting: Endocrinology

## 2013-11-19 NOTE — Telephone Encounter (Signed)
Does Dr. Dwyane Dee have anything to do with this?

## 2013-11-19 NOTE — Telephone Encounter (Signed)
Patient ask if Dr Dwyane Dee would consider getting in net work with [employer]. Please advise

## 2013-11-19 NOTE — Telephone Encounter (Signed)
No he doesn't have anything to do with that. That would be the physician credentialing dept.   [facility] is either one of the [facility] plans or is the plan that is just like [employer] [employer] I dont remember for sure

## 2013-12-13 ENCOUNTER — Other Ambulatory Visit: Payer: Self-pay | Admitting: Endocrinology

## 2014-01-04 ENCOUNTER — Other Ambulatory Visit: Payer: Self-pay

## 2014-01-08 ENCOUNTER — Other Ambulatory Visit: Payer: Self-pay | Admitting: Endocrinology

## 2014-01-08 ENCOUNTER — Other Ambulatory Visit: Payer: Self-pay | Admitting: Internal Medicine

## 2014-01-09 ENCOUNTER — Other Ambulatory Visit: Payer: Self-pay | Admitting: Family Medicine

## 2014-01-11 ENCOUNTER — Other Ambulatory Visit: Payer: Self-pay

## 2014-01-11 DIAGNOSIS — R0981 Nasal congestion: Secondary | ICD-10-CM

## 2014-01-11 MED ORDER — FLUTICASONE PROPIONATE 50 MCG/ACT NA SUSP: 2.0000 | Freq: Every day | NASAL | Status: DC

## 2014-03-11 ENCOUNTER — Ambulatory Visit (INDEPENDENT_AMBULATORY_CARE_PROVIDER_SITE_OTHER): Payer: 59 | Admitting: Family Medicine

## 2014-03-11 ENCOUNTER — Ambulatory Visit (INDEPENDENT_AMBULATORY_CARE_PROVIDER_SITE_OTHER): Payer: 59

## 2014-03-11 VITALS — BP 122/84 | HR 66 | Temp 98.1°F | Resp 18 | Ht 62.0 in | Wt 245.0 lb

## 2014-03-11 DIAGNOSIS — Z23 Encounter for immunization: Secondary | ICD-10-CM

## 2014-03-11 DIAGNOSIS — F32A Depression, unspecified: Secondary | ICD-10-CM

## 2014-03-11 DIAGNOSIS — R51 Headache: Secondary | ICD-10-CM

## 2014-03-11 DIAGNOSIS — E039 Hypothyroidism, unspecified: Secondary | ICD-10-CM

## 2014-03-11 DIAGNOSIS — R519 Headache, unspecified: Secondary | ICD-10-CM

## 2014-03-11 DIAGNOSIS — J302 Other seasonal allergic rhinitis: Secondary | ICD-10-CM

## 2014-03-11 DIAGNOSIS — F329 Major depressive disorder, single episode, unspecified: Secondary | ICD-10-CM

## 2014-03-11 DIAGNOSIS — R0981 Nasal congestion: Secondary | ICD-10-CM

## 2014-03-11 MED ORDER — FLUTICASONE PROPIONATE 50 MCG/ACT NA SUSP: 2.0000 | Freq: Every day | NASAL | Status: DC

## 2014-03-11 MED ORDER — FLUOXETINE HCL 20 MG PO CAPS: ORAL_CAPSULE | ORAL | Status: DC

## 2014-03-11 MED ORDER — LEVOTHYROXINE SODIUM 150 MCG PO TABS: 150.0000 ug | ORAL_TABLET | Freq: Every day | ORAL | Status: DC

## 2014-03-11 NOTE — Progress Notes (Signed)
 Chief Complaint:  Chief Complaint  Patient presents with  . Eye Pain    over 1 month--from a fall--under the right eye  . Medication Refill  . Immunizations    want flu vaccine    HPI: Leslie [name] is a 58 y.o. female who is here for  1. Thyroid medicine refill needed, compliant, no SEs 2. Flonase refills needed 3. She fell 4 weeks ago and was wearing glasses and she smashed them into her ey and has ahd a bump on the side if her right nose, she had swelling and also bruising, it was severe.  No vision changes, she has pain on her right nasal bridge and there is a boney indentation that was not there before. She has ahd no neuor sxs, no confusion, no n/v/HAs. No gait cahnges.    Past Medical History  Diagnosis Date  . Thyroid disease   . Personal history of colonic polyps -adeanoma, sessile serrated polyp 06/12/2013   Past Surgical History  Procedure Laterality Date  . Wisdom tooth extraction    . Laparoscopic gastric banding with hiatal hernia repair  7 yrs ago  . Colonoscopy w/ polypectomy      2 benign polyps removed   History   Social History  . Marital Status: Single    Spouse Name: N/A    Number of Children: N/A  . Years of Education: N/A   Social History Main Topics  . Smoking status: Never Smoker   . Smokeless tobacco: Never Used  . Alcohol Use: 1.8 oz/week    3 Glasses of wine per week     Comment: occ  . Drug Use: No  . Sexual Activity: None   Other Topics Concern  . None   Social History Narrative   Family History  Problem Relation Age of Onset  . Heart attack Mother   . Heart attack Father   . Diabetes Neg Hx   . Hyperlipidemia Neg Hx   . Hypertension Neg Hx   . Sudden death Neg Hx   . Colon cancer Maternal Grandmother    No Known Allergies Prior to Admission medications   Medication Sig Start Date End Date Taking? Authorizing Provider  aspirin EC 81 MG tablet Take 1 tablet (81 mg total) by mouth daily. 03/26/13  Yes Reyne Dumas,  MD  fluticasone (FLONASE) 50 MCG/ACT nasal spray Place 2 sprays into both nostrils daily. 01/11/14  Yes Deepak Advani, MD  levothyroxine (SYNTHROID, LEVOTHROID) 150 MCG tablet TAKE 1 TABLET BY MOUTH EVERY DAY BEFORE BREAKFAST 12/13/13  Yes Elayne Snare, MD  acetaminophen (TYLENOL) 325 MG tablet Take 650 mg by mouth every 6 (six) hours as needed.    Historical Provider, MD  amoxicillin-clavulanate (AUGMENTIN) 875-125 MG per tablet Take 1 tablet by mouth 2 (two) times daily. Patient not taking: Reported on 03/11/2014 06/07/13   Wendie Agreste, MD  dextromethorphan-guaiFENesin [facility] DM) 30-600 MG per 12 hr tablet Take 1 tablet by mouth 2 (two) times daily.    Historical Provider, MD  levothyroxine (SYNTHROID, LEVOTHROID) 150 MCG tablet TAKE 1 TABLET BY MOUTH EVERY DAY BEFORE BREAKFAST 01/08/14   Elayne Snare, MD     ROS: The patient denies fevers, chills, night sweats, unintentional weight loss, chest pain, palpitations, wheezing, dyspnea on exertion, nausea, vomiting, abdominal pain, dysuria, hematuria, melena, numbness, weakness, or tingling.   All other systems have been reviewed and were otherwise negative with the exception of those mentioned in the HPI and as above.  PHYSICAL EXAM: Filed Vitals:   03/11/14 1132  BP: 122/84  Pulse: 66  Temp: 98.1 F (36.7 C)  Resp: 18   Filed Vitals:   03/11/14 1132  Height: 5\' 2"  (1.575 m)  Weight: 245 lb (111.131 kg)   Body mass index is 44.8 kg/(m^2).  General: Alert, no acute distress HEENT:  Normocephalic, atraumatic, oropharynx patent. EOMI, PERRLA, no appreciable thryoidmegaly. + right nasal orbital boney ridge, + tenderness.  Cardiovascular:  Regular rate and rhythm, no rubs murmurs or gallops.  No Carotid bruits, radial pulse intact. No pedal edema.  Respiratory: Clear to auscultation bilaterally.  No wheezes, rales, or rhonchi.  No cyanosis, no use of accessory musculature GI: No organomegaly, abdomen is soft and non-tender, positive  bowel sounds.  No masses. Skin: No rashes. Neurologic: Facial musculature symmetric. Psychiatric: Patient is appropriate throughout our interaction. Lymphatic: No cervical lymphadenopathy Musculoskeletal: Gait intact.   LABS: Results for orders placed or performed in visit on 03/11/14  TSH  Result Value Ref Range   TSH 0.758 0.350 - 4.500 uIU/mL     EKG/XRAY:   Primary read interpreted by Dr. Marin Comment at [facility]. Neg for acute fx r dislocation   ASSESSMENT/PLAN: Encounter Diagnoses  Name Primary?  . Hypothyroidism, unspecified hypothyroidism type Yes  . Depression   . Other seasonal allergic rhinitis   . Nasal congestion   . Need for prophylactic vaccination and inoculation against influenza   . Facial pain    Refilled thyroid Refilled flonase Flu vaccine given Labs pending Rx Prozac 20 mg  Will await for official xray results but so far looks negative to me.  F/u prn otherwise in 6 months    Gross sideeffects, risk and benefits, and alternatives of medications d/w patient. Patient is aware that all medications have potential sideeffects and we are unable to predict every sideeffect or drug-drug interaction that may occur.  , [place], DO 03/13/2014 4:28 PM

## 2014-03-12 LAB — TSH: TSH: 0.758 u[IU]/mL (ref 0.350–4.500)

## 2014-05-07 ENCOUNTER — Telehealth: Payer: Self-pay

## 2014-05-07 NOTE — Telephone Encounter (Signed)
Dr. Marin Comment I read your OV note but couldn't see that this was discussed. Please advise.

## 2014-05-07 NOTE — Telephone Encounter (Signed)
Pt called wanting to increase her Prozac from a half a pill to a whole pill a day. Please advise at [phone]

## 2014-05-15 ENCOUNTER — Telehealth: Payer: Self-pay | Admitting: Family Medicine

## 2014-05-15 ENCOUNTER — Other Ambulatory Visit: Payer: Self-pay | Admitting: Family Medicine

## 2014-05-15 DIAGNOSIS — F329 Major depressive disorder, single episode, unspecified: Secondary | ICD-10-CM

## 2014-05-15 DIAGNOSIS — F32A Depression, unspecified: Secondary | ICD-10-CM

## 2014-05-15 MED ORDER — FLUOXETINE HCL 20 MG PO CAPS
ORAL_CAPSULE | ORAL | Status: DC
Start: 2014-05-15 — End: 2014-08-16

## 2014-05-15 NOTE — Telephone Encounter (Signed)
Patient is calling to check the status of her getting an increase on her Prozac. Please let patient know when it is ready.  [phone]

## 2014-08-16 ENCOUNTER — Other Ambulatory Visit: Payer: Self-pay

## 2014-08-16 DIAGNOSIS — F329 Major depressive disorder, single episode, unspecified: Secondary | ICD-10-CM

## 2014-08-16 DIAGNOSIS — F32A Depression, unspecified: Secondary | ICD-10-CM

## 2014-08-16 MED ORDER — FLUOXETINE HCL 20 MG PO CAPS: ORAL_CAPSULE | ORAL | Status: DC

## 2014-08-16 MED ORDER — FLUOXETINE HCL 20 MG PO CAPS
ORAL_CAPSULE | ORAL | Status: DC
Start: 2014-08-16 — End: 2014-08-16

## 2014-09-10 ENCOUNTER — Other Ambulatory Visit: Payer: Self-pay | Admitting: Family Medicine

## 2014-09-17 ENCOUNTER — Telehealth: Payer: Self-pay | Admitting: Family Medicine

## 2014-09-17 DIAGNOSIS — F329 Major depressive disorder, single episode, unspecified: Secondary | ICD-10-CM

## 2014-09-17 DIAGNOSIS — F32A Depression, unspecified: Secondary | ICD-10-CM

## 2014-09-17 DIAGNOSIS — E039 Hypothyroidism, unspecified: Secondary | ICD-10-CM

## 2014-09-17 MED ORDER — FLUOXETINE HCL 20 MG PO CAPS
ORAL_CAPSULE | ORAL | Status: DC
Start: 2014-09-17 — End: 2015-03-19

## 2014-09-17 MED ORDER — MOMETASONE FUROATE 50 MCG/ACT NA SUSP: 2.0000 | Freq: Every day | NASAL | Status: DC

## 2014-09-17 MED ORDER — LEVOTHYROXINE SODIUM 150 MCG PO TABS: ORAL_TABLET | ORAL | Status: DC

## 2014-09-17 NOTE — Telephone Encounter (Signed)
Dr. Marin Comment:  Patient is calling due to financial difficulties.  She needs to maintain her medication but cannot afford an office visit every time she needs refills. Would it be possible for her to come in for labs only to keep her prescriptions for synthroid and fluticosone and would like to change her prescription for allergy spray to nasonex. Please call her at [phone]

## 2014-09-17 NOTE — Telephone Encounter (Signed)
Refills given, TSH normal in 02/2014. Will need to get TSH before last refill is completed. Attempted to call to notify but voicemail full.

## 2014-09-19 ENCOUNTER — Telehealth: Payer: Self-pay

## 2014-09-19 NOTE — Telephone Encounter (Signed)
PA stated on covermymeds for generic nasonex. Pt has tried flonase, but she needs to have tried 2 of the preferred alternatives. Need to ask pt if she has ever tried flunisolide (brands Aerospan, AeroBid, Nasalide, or Nasarel), or Nasacort? LMOM to CB.

## 2014-09-26 NOTE — Telephone Encounter (Signed)
Tried to call pt back again since she has not returned call and could not LM, VM was full. Notified pharm that waiting on call back from pt.

## 2014-11-07 ENCOUNTER — Telehealth: Payer: Self-pay

## 2014-11-07 DIAGNOSIS — J309 Allergic rhinitis, unspecified: Secondary | ICD-10-CM

## 2014-11-07 NOTE — Telephone Encounter (Signed)
Pt said Dr. Marin Comment contacted her to let her know her insurance won't cover her Nasonex script. She is willing to try  anything her insurance will cover. Please advise at [phone]

## 2014-11-07 NOTE — Telephone Encounter (Signed)
Please advise what you would like prescribed in replacement of the Nasonex.

## 2014-11-08 MED ORDER — FLUTICASONE FUROATE 27.5 MCG/SPRAY NA SUSP: 1.0000 | Freq: Every day | NASAL | Status: DC

## 2014-11-08 NOTE — Telephone Encounter (Signed)
Spoke with patient, needs to try 2 diff meds before able to approve nasonex, will try veramyst which is more similar to nasonex

## 2014-11-12 NOTE — Telephone Encounter (Signed)
LMOM for pt w/Dr Le's message and notified pharm.

## 2014-11-12 NOTE — Telephone Encounter (Signed)
Dr Marin Comment, Veramist needs a PA too which means it won't be covered. Do you want to try one of these meds that was on the form of (I'm assuming) preferred?: brands Aerospan, AeroBid, Nasalide, or Nasarel.

## 2014-11-12 NOTE — Telephone Encounter (Signed)
I had already spoken to her, can you just call her again and  tell her to use nasacort over the counter or whatever she was using before since it would require PA . She knows  It was a long shot. Thanks

## 2014-12-08 ENCOUNTER — Other Ambulatory Visit: Payer: Self-pay | Admitting: Family Medicine

## 2015-03-19 ENCOUNTER — Other Ambulatory Visit: Payer: Self-pay | Admitting: Family Medicine

## 2015-03-21 NOTE — Telephone Encounter (Signed)
Patients needs appointment

## 2015-04-23 ENCOUNTER — Other Ambulatory Visit: Payer: Self-pay | Admitting: Family Medicine

## 2015-04-24 ENCOUNTER — Other Ambulatory Visit: Payer: Self-pay | Admitting: Family Medicine

## 2015-07-30 ENCOUNTER — Other Ambulatory Visit: Payer: Self-pay | Admitting: Family Medicine

## 2015-07-30 DIAGNOSIS — Z1231 Encounter for screening mammogram for malignant neoplasm of breast: Secondary | ICD-10-CM

## 2015-09-02 ENCOUNTER — Ambulatory Visit
Admission: RE | Admit: 2015-09-02 | Discharge: 2015-09-02 | Disposition: A | Discharge status: Home / Self Care | Payer: Medicaid Other | Source: Ambulatory Visit | Attending: Family Medicine | Admitting: Family Medicine

## 2015-09-02 DIAGNOSIS — Z1231 Encounter for screening mammogram for malignant neoplasm of breast: Secondary | ICD-10-CM

## 2018-03-26 IMAGING — MG DIGITAL SCREENING BILATERAL MAMMOGRAM WITH CAD
8 series · 8 of 8 positions shown · non-contrast
Comparison: Previous exam(s).

ACR Breast Density Category a: The breast tissue is almost entirely
fatty.

CLINICAL DATA: Screening.

EXAM:
DIGITAL SCREENING BILATERAL MAMMOGRAM WITH CAD

[R MLO (1 of 2)]
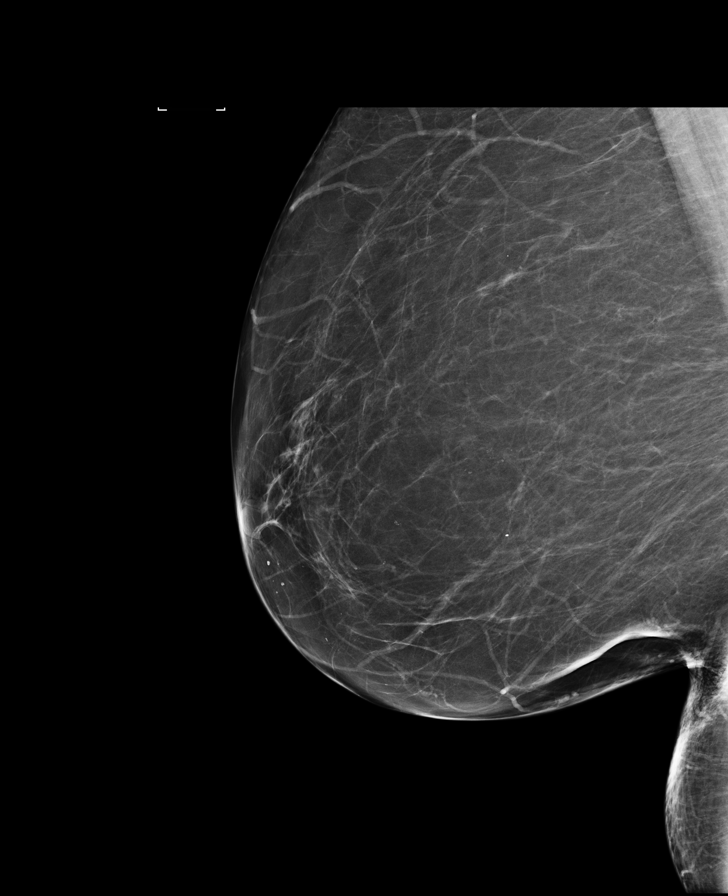

[R CC]
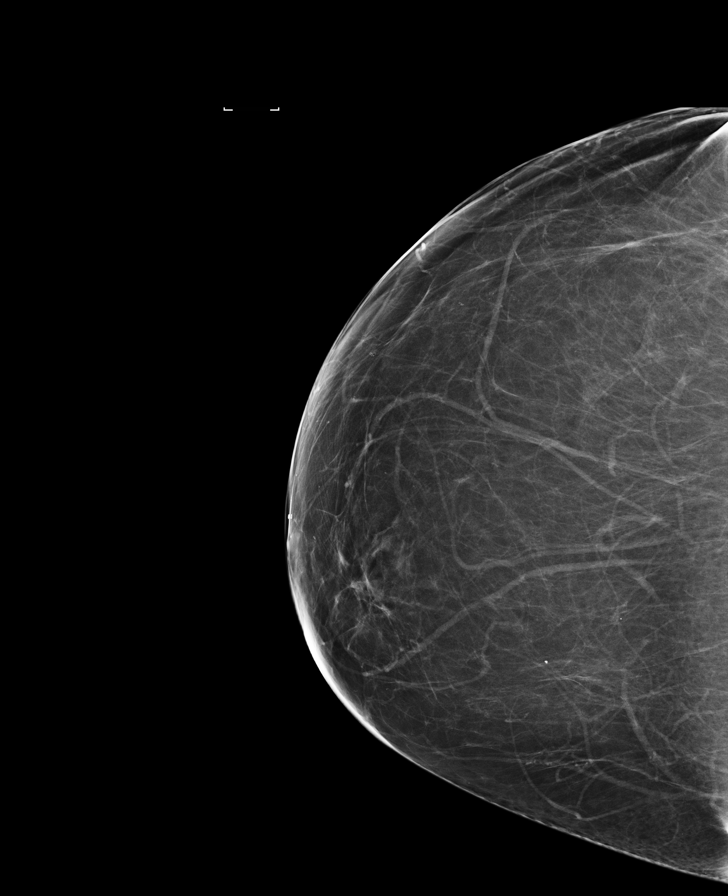

[L MLO (1 of 2)]
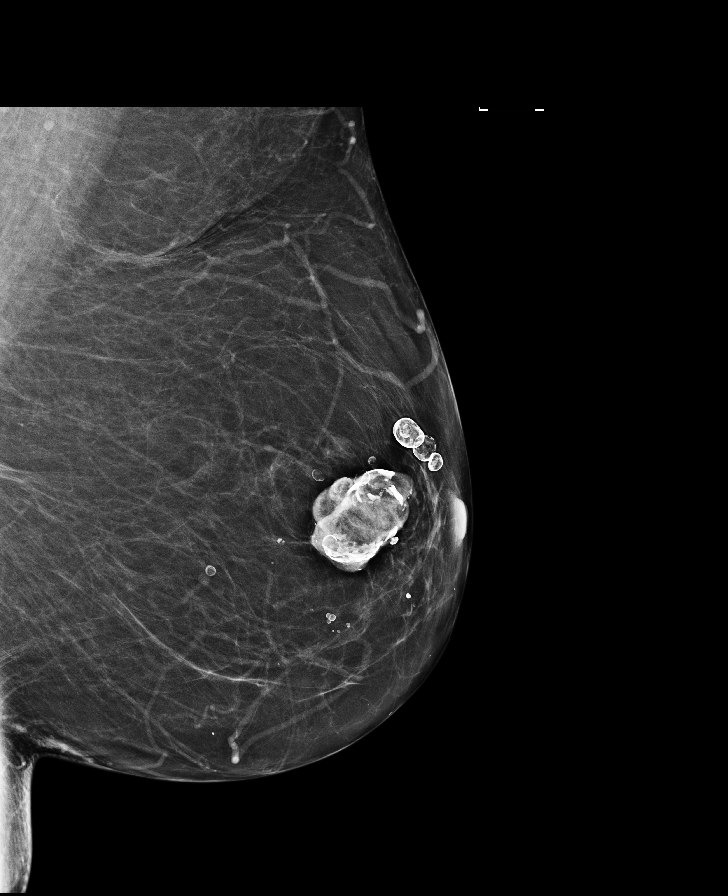

[L CC]
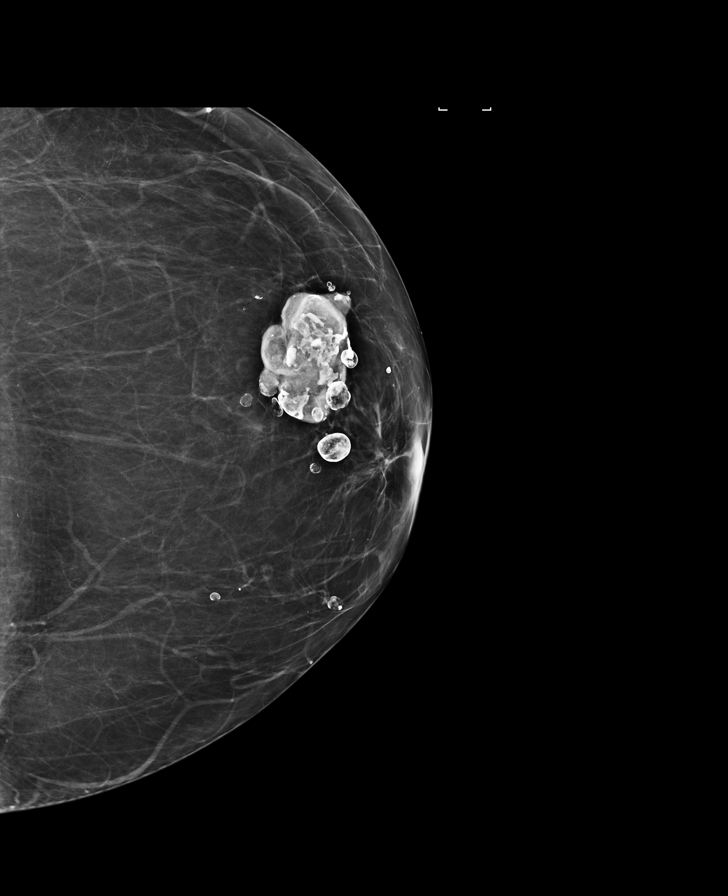

[R XCCL]
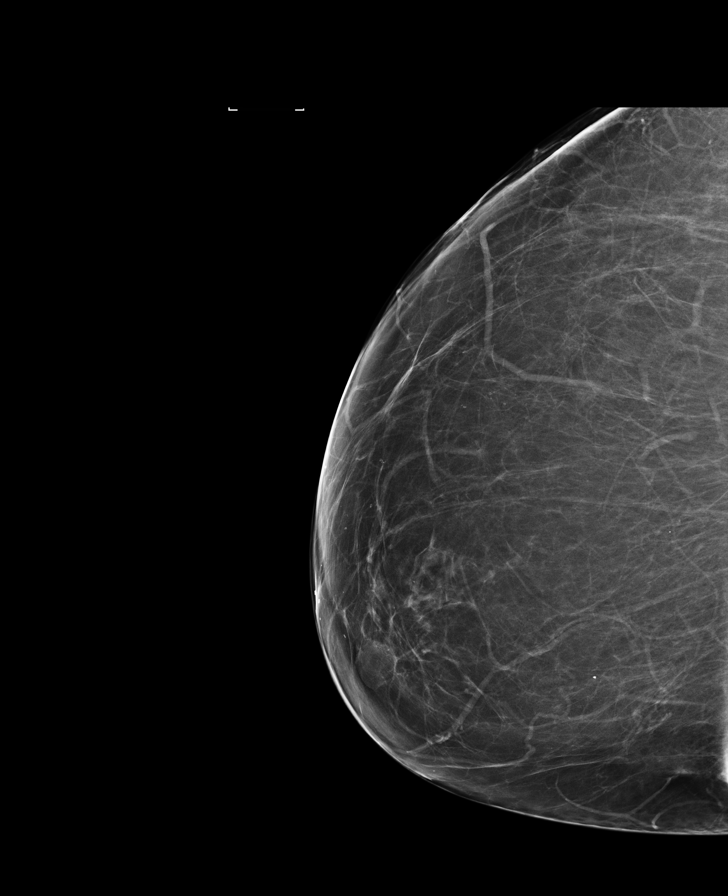

[L XCCL]
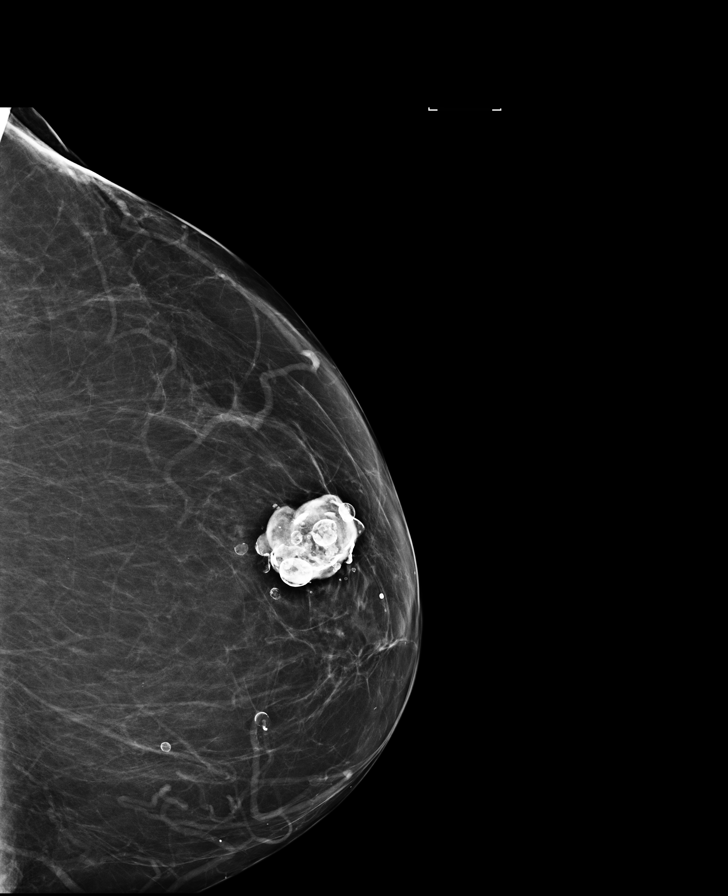

[R MLO (2 of 2)]
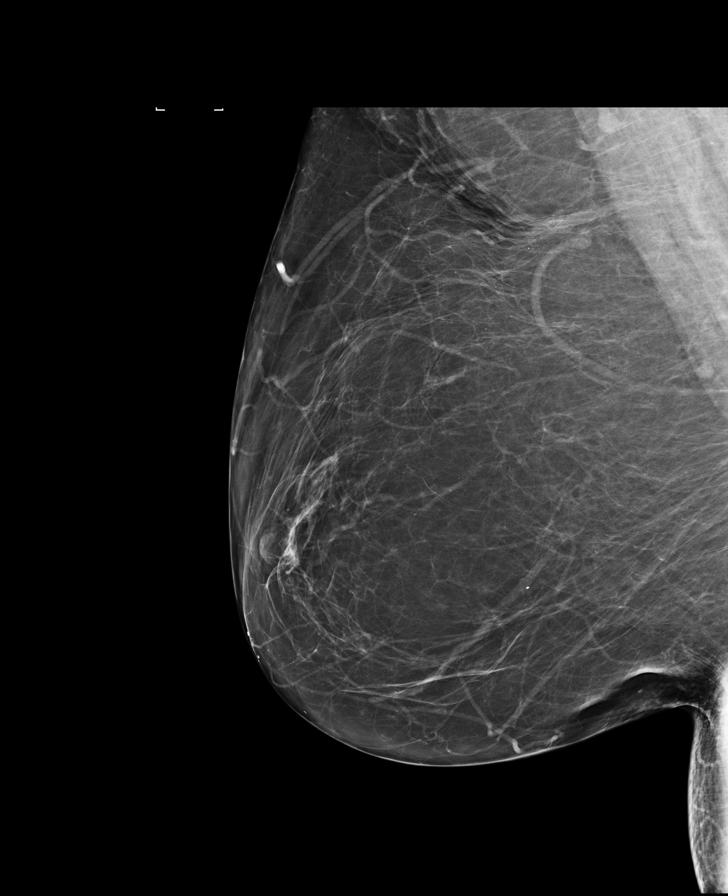

[L MLO (2 of 2)]
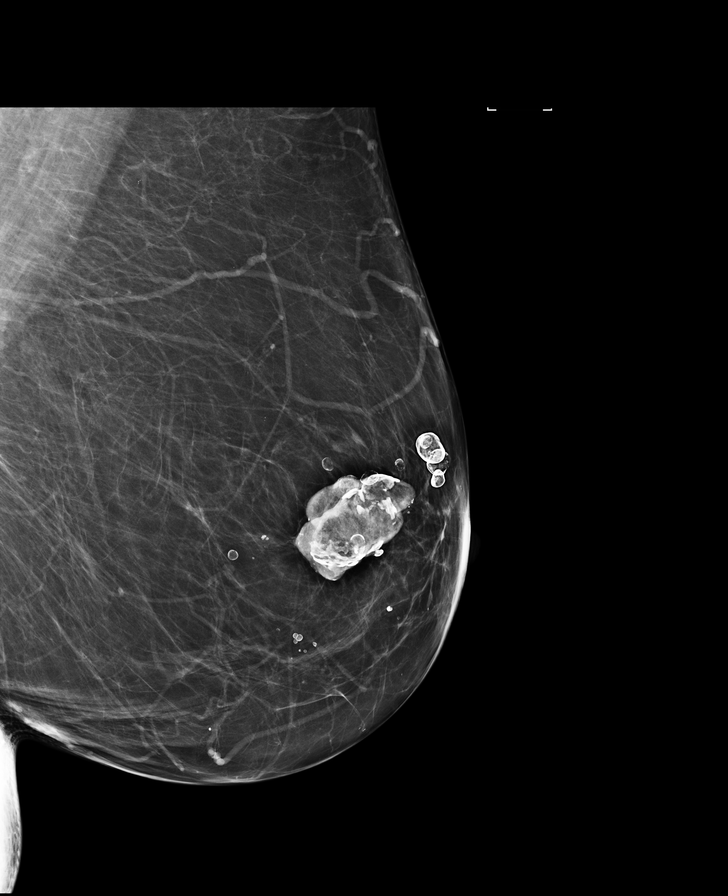

[8 of 8 positions shown; findings below may reference images not displayed]

FINDINGS: There are no findings suspicious for malignancy. Images were
processed with CAD.
IMPRESSION: No mammographic evidence of malignancy. A result letter of this
screening mammogram will be mailed directly to the patient.

RECOMMENDATION:
Screening mammogram in one year. (Code:[id number])

BI-RADS CATEGORY  1: Negative.

## 2018-07-17 ENCOUNTER — Encounter: Payer: Self-pay | Admitting: Internal Medicine

## 2018-09-18 ENCOUNTER — Encounter: Payer: Self-pay | Admitting: Internal Medicine

## 2019-02-20 DIAGNOSIS — K912 Postsurgical malabsorption, not elsewhere classified: Secondary | ICD-10-CM

## 2019-02-20 HISTORY — DX: Acquired absence of stomach (part of): K91.2

## 2019-02-26 DIAGNOSIS — Z9884 Bariatric surgery status: Secondary | ICD-10-CM

## 2019-02-26 HISTORY — DX: Bariatric surgery status: Z98.84

## 2019-02-26 HISTORY — PX: LAPAROSCOPIC ROUX-EN-Y GASTRIC BYPASS WITH UPPER ENDOSCOPY AND REMOVAL OF LAP BAND: SHX6505

## 2019-06-21 ENCOUNTER — Ambulatory Visit: Payer: Medicaid Other | Attending: Internal Medicine

## 2022-06-09 ENCOUNTER — Ambulatory Visit: Payer: 59 | Attending: Family Medicine

## 2022-06-09 ENCOUNTER — Other Ambulatory Visit: Payer: Self-pay

## 2022-06-09 DIAGNOSIS — M25522 Pain in left elbow: Secondary | ICD-10-CM | POA: Insufficient documentation

## 2022-06-09 DIAGNOSIS — R296 Repeated falls: Secondary | ICD-10-CM | POA: Insufficient documentation

## 2022-06-09 DIAGNOSIS — R262 Difficulty in walking, not elsewhere classified: Secondary | ICD-10-CM | POA: Diagnosis present

## 2022-06-09 NOTE — Therapy (Signed)
OUTPATIENT PHYSICAL THERAPY LOWER EXTREMITY EVALUATION   Patient Name: Leslie [name] MRN: [id number] DOB:September 24, 1955, 67 y.o., female Today's Date: 06/09/2022  END OF SESSION:  PT End of Session - 06/09/22 1513     Visit Number 1    Number of Visits 9    Date for PT Re-Evaluation 08/20/22    Authorization Type [employer]TE    Authorization - Number of Visits 27    Progress Note Due on Visit 10    PT Start Time 1506    PT Stop Time 1549    PT Time Calculation (min) 43 min    Activity Tolerance Patient tolerated treatment well    Behavior During Therapy [facility] for tasks assessed/performed             Past Medical History:  Diagnosis Date   Personal history of colonic polyps -adeanoma, sessile serrated polyp 06/12/2013   Thyroid disease    Past Surgical History:  Procedure Laterality Date   COLONOSCOPY W/ POLYPECTOMY     2 benign polyps removed   LAPAROSCOPIC GASTRIC BANDING WITH HIATAL HERNIA REPAIR  7 yrs ago   WISDOM TOOTH EXTRACTION     Patient Active Problem List   Diagnosis Date Noted   Nasal congestion 06/25/2013   Personal history of colonic polyps -adeanoma, sessile serrated polyp 06/12/2013   Injury of left elbow 02/13/2013   Unspecified hypothyroidism 02/01/2013    PCP: Ethelda Chick, MD   REFERRING PROVIDER: Porfirio Oar, PA-C   REFERRING DIAG: R29.6 (ICD-10-CM) - Repeated falls; elbow pain, unspecified laterally. Upper extremity pain, unspecified laterally.  THERAPY DIAG:  Repeated falls  Difficulty in walking, not elsewhere classified  Pain in left elbow  Rationale for Evaluation and Treatment: R29.6 (ICD-10-CM) - Repeated falls   ONSET DATE: Past year  SUBJECTIVE:   SUBJECTIVE STATEMENT: Pt reports 4 falls in the past year. 3 falls have been related to a new dog (25 lbs) when on leash. The dog has since gone through training. Pt does endorse feeling unsteady on uneven surfaces. Re: her L elbow, pt states she has had  chronic pain c L elbow since a fx from a fall several years ago. She notes the L elbow has a chip in it. With a recent fall was it has been hurting more, but t is slowly getting better and close to its baseline of pain. She went through PT with the initial injury.Marland Kitchen  PERTINENT HISTORY: High BMI  PAIN:  Are you having pain? Yes: NPRS scale: 1/10 Pain location: lLelbow Pain description: ache Aggravating factors: Increased activity Relieving factors: Rest, exercise bands  PRECAUTIONS: None  WEIGHT BEARING RESTRICTIONS: No  FALLS:  Has patient fallen in last 6 months? Yes. Number of falls 2 1 with her dog and 1 when carrying too many items and tripped over a threshold of a doorway  LIVING ENVIRONMENT: Lives with: lives alone Lives in: House/apartment Able to access and be mobile in her home  OCCUPATION: Retired, gardens-elevated beds and yard, cares for dog and cats  PLOF: Independent  PATIENT GOALS: Better balance and for paI elbow pain to improve  NEXT MD VISIT: next month  OBJECTIVE:   DIAGNOSTIC FINDINGS: NA  PATIENT SURVEYS:  FOTO: Perceived function   53%, predicted   58%   COGNITION: Overall cognitive status: Within functional limits for tasks assessed     SENSATION: WFL  EDEMA:  NT  POSTURE: decreased lumbar lordosis and flexed trunk   LOWER EXTREMITY ROM:  Grossly WN:s  Active ROM Right eval Left eval  Hip flexion    Hip extension    Hip abduction    Hip adduction    Hip internal rotation    Hip external rotation    Knee flexion    Knee extension    Ankle dorsiflexion    Ankle plantarflexion    Ankle inversion    Ankle eversion     (Blank rows = not tested)  LOWER EXTREMITY MMT:  MMT Right eval Left eval  Hip flexion 4 4  Hip extension 4- 4-  Hip abduction 4- 4-  Hip adduction 4+ 4+  Hip internal rotation    Hip external rotation 4 4  Knee flexion    Knee extension    Ankle dorsiflexion 5 5  Ankle plantarflexion    Ankle  inversion    Ankle eversion     (Blank rows = not tested)   FUNCTIONAL TESTS:  5 times sit to stand: 17.5" s use of hands 2 minute walk test: TBA Single leg standing: L=1", R=1"  Standing with eyes closed= 10+ sec  Berg: TBA GAIT: Distance walked: 200' Assistive device utilized: None Level of assistance: Complete Independence Comments: WNLs   TODAY'S TREATMENT:                                                                                                                               [facility] Adult PT Treatment:                                                DATE: 06/09/22 Therapeutic Exercise: Developed, instructed in, and pt completed therex as noted in HEP   PATIENT EDUCATION:  Education details: Eval findings, POC, HEP  Person educated: Patient Education method: Explanation, Demonstration, Tactile cues, Verbal cues, and Handouts Education comprehension: verbalized understanding, returned demonstration, verbal cues required, and tactile cues required  HOME EXERCISE PROGRAM: Access Code: [email or web address] URL: [email or web address] Date: 06/09/2022 Prepared by: Joellyn Rued  Exercises - Sit to Stand Without Arm Support  - 2 x daily - 7 x weekly - 1 sets - 10 reps - 3 hold - Standing Hip Abduction with Counter Support  - 2 x daily - 7 x weekly - 2 sets - 10 reps - 3 hold - Heel Toe Raises with Counter Support  - 2 x daily - 7 x weekly - 2 sets - 10 reps - 2 hold - Standing Knee Flexion with Counter Support  - 2 x daily - 7 x weekly - 2 sets - 5 reps - 30 hold  ASSESSMENT:  CLINICAL IMPRESSION: Patient is a 67 y.o. female who was seen today for physical therapy evaluation and treatment for R29.6 (ICD-10-CM) - Repeated falls .   OBJECTIVE IMPAIRMENTS: decreased balance, difficulty walking, decreased strength, obesity, and pain.   ACTIVITY  LIMITATIONS: carrying, lifting, bending, standing, squatting, stairs, and locomotion level  PARTICIPATION LIMITATIONS: meal prep,  cleaning, laundry, yard work, and recreation-gardening  PERSONAL FACTORS: Age, Fitness, Past/current experiences, Time since onset of injury/illness/exacerbation, and 1 comorbidity: high BMI  are also affecting patient's functional outcome.   REHAB POTENTIAL: Good  CLINICAL DECISION MAKING: Evolving/moderate complexity  EVALUATION COMPLEXITY: Moderate   GOALS:  SHORT TERM GOALS: Target date: 07/02/22 Pt will be Ind in an initial HEP Baseline: Initiated Goal status: INITIAL  LONG TERM GOALS: Target date: 08/20/22  Pt will be Ind in a final HEP to maintain achieved LOF  Baseline: initiated Goal status: INITIAL  2.  Improve 5xSTS by MCID of 5" and by MCID of 69ft as indication of improved functional mobility  Baseline: 5xSTS=17.5' s use of hands, TBA Goal status: INITIAL  3.  Pt's Berg balance test will improve by 5 points as appropriate base on pt's initial score Baseline: TBA Goal status: INITIAL  4.  Increase pt's bilat hip strength to 4+/5 for improved balance  Baseline: see flow sheets Goal status: INITIAL  5.  Pt's single leg stance balnce will increase to 8" or greater for improved balance on uneven surfeces which pt encounters with gardening Baseline: Bilat 1" Goal status: INITIAL   PLAN:  PT FREQUENCY: 1x/week  PT DURATION: 8 weeks  PLANNED INTERVENTIONS: Therapeutic exercises, Therapeutic activity, Balance training, Gait training, Patient/Family education, Self Care, Joint mobilization, Dry Needling, Electrical stimulation, Cryotherapy, Moist heat, Taping, Ultrasound, Ionotophoresis 4mg /ml Dexamethasone, Manual therapy, and Re-evaluation  PLAN FOR NEXT SESSION: Review FOTO; assess response to HEP; progress therex as indicated; use of modalities, manual therapy; and TPDN as indicated.    Mccartney Chuba MS, PT 06/09/22 6:23 PM

## 2022-06-24 NOTE — Therapy (Signed)
OUTPATIENT PHYSICAL THERAPY TREATMENT NOTE   Patient Name: Leslie [name] MRN: [id number] DOB:09/28/1955, 67 y.o., female Today's Date: 06/25/2022  PCP: Ethelda Chick, MD   REFERRING PROVIDER: Porfirio Oar, PA-C   END OF SESSION:   PT End of Session - 06/25/22 1328     Visit Number 2    Date for PT Re-Evaluation 08/20/22    Authorization Type [employer] D[employer]PLETE    Authorization - Visit Number 1    Authorization - Number of Visits 27    Progress Note Due on Visit 10    PT Start Time 1230    PT Stop Time 1313    PT Time Calculation (min) 43 min    Activity Tolerance Patient tolerated treatment well    Behavior During Therapy [facility] for tasks assessed/performed             Past Medical History:  Diagnosis Date   Personal history of colonic polyps -adeanoma, sessile serrated polyp 06/12/2013   Thyroid disease    Past Surgical History:  Procedure Laterality Date   COLONOSCOPY W/ POLYPECTOMY     2 benign polyps removed   LAPAROSCOPIC GASTRIC BANDING WITH HIATAL HERNIA REPAIR  7 yrs ago   WISDOM TOOTH EXTRACTION     Patient Active Problem List   Diagnosis Date Noted   Nasal congestion 06/25/2013   Personal history of colonic polyps -adeanoma, sessile serrated polyp 06/12/2013   Injury of left elbow 02/13/2013   Unspecified hypothyroidism 02/01/2013    REFERRING DIAG: R29.6 (ICD-10-CM) - Repeated falls; elbow pain, unspecified laterally. Upper extremity pain, unspecified laterally.   THERAPY DIAG:  Repeated falls  Difficulty in walking, not elsewhere classified  Pain in left elbow  Rationale for Evaluation and Treatment Rehabilitation  ONSET DATE: Past year   SUBJECTIVE:    SUBJECTIVE STATEMENT: Pt reports better confidence with steps, but uneven surfaces are still a challenge and concern.   PERTINENT HISTORY: High BMI   PAIN:  Are you having pain? Yes: NPRS scale: 1/10 Pain location: lLelbow Pain description: ache Aggravating factors:  Increased activity Relieving factors: Rest, exercise bands   PRECAUTIONS: None   WEIGHT BEARING RESTRICTIONS: No   FALLS:  Has patient fallen in last 6 months? Yes. Number of falls 2 1 with her dog and 1 when carrying too many items and tripped over a threshold of a doorway   LIVING ENVIRONMENT: Lives with: lives alone Lives in: House/apartment Able to access and be mobile in her home   OCCUPATION: Retired, gardens-elevated beds and yard, cares for dog and cats   PLOF: Independent   PATIENT GOALS: Better balance and for paI elbow pain to improve   NEXT MD VISIT: next month   OBJECTIVE: (objective measures completed at initial evaluation unless otherwise dated)   DIAGNOSTIC FINDINGS: NA   PATIENT SURVEYS:  FOTO: Perceived function   53%, predicted   58%    COGNITION: Overall cognitive status: Within functional limits for tasks assessed                         SENSATION: WFL   EDEMA:  NT   POSTURE: decreased lumbar lordosis and flexed trunk    LOWER EXTREMITY ROM:            Grossly WN:s Active ROM Right eval Left eval  Hip flexion      Hip extension      Hip abduction      Hip adduction  Hip internal rotation      Hip external rotation      Knee flexion      Knee extension      Ankle dorsiflexion      Ankle plantarflexion      Ankle inversion      Ankle eversion       (Blank rows = not tested)   LOWER EXTREMITY MMT:   MMT Right eval Left eval  Hip flexion 4 4  Hip extension 4- 4-  Hip abduction 4- 4-  Hip adduction 4+ 4+  Hip internal rotation      Hip external rotation 4 4  Knee flexion      Knee extension      Ankle dorsiflexion 5 5  Ankle plantarflexion      Ankle inversion      Ankle eversion       (Blank rows = not tested)     FUNCTIONAL TESTS:  5 times sit to stand: 17.5" s use of hands 2 minute walk test: TBA 06/25/22; 395' Single leg standing: L=1", R=1"            Standing with eyes closed= 10+ sec            Berg:  TBA GAIT: Distance walked: 200' Assistive device utilized: None Level of assistance: Complete Independence Comments: WNLs     TODAY'S TREATMENT: [facility] Adult PT Treatment:                                                DATE: 06/25/22 Therapeutic Exercise: STS x10 s use of hands Ankle PF/DF x10 Standing hip abd x10 each Lateral steps 2x10 airex each Therapeutic Activity: SLS x5 30" 2MWT-395' somewhat short of breath Tandem standing each                                                                                                                              [facility] Adult PT Treatment:                                                DATE: 06/09/22 Therapeutic Exercise: Developed, instructed in, and pt completed therex as noted in HEP    PATIENT EDUCATION:  Education details: Eval findings, POC, HEP  Person educated: Patient Education method: Explanation, Demonstration, Tactile cues, Verbal cues, and Handouts Education comprehension: verbalized understanding, returned demonstration, verbal cues required, and tactile cues required   HOME EXERCISE PROGRAM: Access Code: [email or web address] URL: [email or web address] Date: 06/25/2022 Prepared by: Joellyn Rued  Exercises - Sit to Stand Without Arm Support  - 2 x daily - 7 x weekly - 1 sets - 10 reps - 3 hold - Standing  Hip Abduction with Counter Support  - 2 x daily - 7 x weekly - 2 sets - 10 reps - 3 hold - Heel Toe Raises with Counter Support  - 2 x daily - 7 x weekly - 2 sets - 10 reps - 2 hold - Standing Single Leg Stance with Counter Support  - 2 x daily - 7 x weekly - 1 sets - 3 reps - 30 hold - Standing Tandem Balance with Counter Support  - 2 x daily - 7 x weekly - 3 sets - 3 reps   ASSESSMENT:   CLINICAL IMPRESSION: PT was completed for LE strengthening and balance. Reviewed and added to HEP. Pt returned proper demonstration of her HEP. Pt notes she is completing her HEP consistently. Pt tolerated PT today without adverse  effects.Pt will continue to benefit from skilled PT to address impairments for improved function.     OBJECTIVE IMPAIRMENTS: decreased balance, difficulty walking, decreased strength, obesity, and pain.    ACTIVITY LIMITATIONS: carrying, lifting, bending, standing, squatting, stairs, and locomotion level   PARTICIPATION LIMITATIONS: meal prep, cleaning, laundry, yard work, and recreation-gardening   PERSONAL FACTORS: Age, Fitness, Past/current experiences, Time since onset of injury/illness/exacerbation, and 1 comorbidity: high BMI  are also affecting patient's functional outcome.    REHAB POTENTIAL: Good   CLINICAL DECISION MAKING: Evolving/moderate complexity   EVALUATION COMPLEXITY: Moderate     GOALS:   SHORT TERM GOALS: Target date: 07/02/22 Pt will be Ind in an initial HEP Baseline: Initiated Goal status: MET   LONG TERM GOALS: Target date: 08/20/22   Pt will be Ind in a final HEP to maintain achieved LOF  Baseline: initiated Goal status: INITIAL   2.  Improve 5xSTS by MCID of 5" and by MCID of 28ft as indication of improved functional mobility  Baseline: 5xSTS=17.5' s use of hands, TBA: 06/25/22=395" Goal status: INITIAL   3.  Pt's Berg balance test will improve by 5 points as appropriate base on pt's initial score Baseline: TBA Goal status: INITIAL   4.  Increase pt's bilat hip strength to 4+/5 for improved balance  Baseline: see flow sheets Goal status: INITIAL   5.  Pt's single leg stance balnce will increase to 8" or greater for improved balance on uneven surfeces which pt encounters with gardening Baseline: Bilat 1" Goal status: INITIAL     PLAN:   PT FREQUENCY: 1x/week   PT DURATION: 8 weeks   PLANNED INTERVENTIONS: Therapeutic exercises, Therapeutic activity, Balance training, Gait training, Patient/Family education, Self Care, Joint mobilization, Dry Needling, Electrical stimulation, Cryotherapy, Moist heat, Taping, Ultrasound, Ionotophoresis  4mg /ml Dexamethasone, Manual therapy, and Re-evaluation   PLAN FOR NEXT SESSION: Review FOTO; assess response to HEP; progress therex as indicated; use of modalities, manual therapy; and TPDN as indicated.    Yannick Steuber MS, PT 06/25/22 1:28 PM

## 2022-06-25 ENCOUNTER — Ambulatory Visit: Payer: 59 | Attending: Family Medicine

## 2022-06-25 DIAGNOSIS — M25522 Pain in left elbow: Secondary | ICD-10-CM

## 2022-06-25 DIAGNOSIS — R262 Difficulty in walking, not elsewhere classified: Secondary | ICD-10-CM

## 2022-06-25 DIAGNOSIS — R296 Repeated falls: Secondary | ICD-10-CM | POA: Insufficient documentation

## 2022-06-30 ENCOUNTER — Ambulatory Visit: Payer: 59 | Admitting: Physical Therapy

## 2022-06-30 DIAGNOSIS — R296 Repeated falls: Secondary | ICD-10-CM | POA: Diagnosis not present

## 2022-06-30 DIAGNOSIS — R262 Difficulty in walking, not elsewhere classified: Secondary | ICD-10-CM

## 2022-06-30 NOTE — Therapy (Signed)
OUTPATIENT PHYSICAL THERAPY TREATMENT NOTE   Patient Name: Leslie [name] MRN: [id number] DOB:04-Dec-1955, 67 y.o., female Today's Date: 06/30/2022  PCP: Ethelda Chick, MD   REFERRING PROVIDER: Porfirio Oar, PA-C   END OF SESSION:   PT End of Session - 06/30/22 1418     Visit Number 3    Number of Visits 9    Date for PT Re-Evaluation 08/20/22    Authorization Type [employer]PLETE    Authorization - Visit Number 2    Authorization - Number of Visits 27    Progress Note Due on Visit 10    PT Start Time 0215    PT Stop Time 0245    PT Time Calculation (min) 30 min             Past Medical History:  Diagnosis Date   Personal history of colonic polyps -adeanoma, sessile serrated polyp 06/12/2013   Thyroid disease    Past Surgical History:  Procedure Laterality Date   COLONOSCOPY W/ POLYPECTOMY     2 benign polyps removed   LAPAROSCOPIC GASTRIC BANDING WITH HIATAL HERNIA REPAIR  7 yrs ago   WISDOM TOOTH EXTRACTION     Patient Active Problem List   Diagnosis Date Noted   Nasal congestion 06/25/2013   Personal history of colonic polyps -adeanoma, sessile serrated polyp 06/12/2013   Injury of left elbow 02/13/2013   Unspecified hypothyroidism 02/01/2013    REFERRING DIAG: R29.6 (ICD-10-CM) - Repeated falls; elbow pain, unspecified laterally. Upper extremity pain, unspecified laterally.   THERAPY DIAG:  Repeated falls  Difficulty in walking, not elsewhere classified  Rationale for Evaluation and Treatment Rehabilitation  ONSET DATE: Past year   SUBJECTIVE:    SUBJECTIVE STATEMENT: Hips 2/10, just some achiness.    PERTINENT HISTORY: High BMI   PAIN:  Are you having pain? Yes: NPRS scale: 2-3/10 Pain location: L Lelbow Pain description: ache Aggravating factors: Increased activity Relieving factors: Rest, exercise bands   PRECAUTIONS: None   WEIGHT BEARING RESTRICTIONS: No   FALLS:  Has patient fallen in last 6 months? Yes.  Number of falls 2 1 with her dog and 1 when carrying too many items and tripped over a threshold of a doorway   LIVING ENVIRONMENT: Lives with: lives alone Lives in: House/apartment Able to access and be mobile in her home   OCCUPATION: Retired, gardens-elevated beds and yard, cares for dog and cats   PLOF: Independent   PATIENT GOALS: Better balance and for paI elbow pain to improve   NEXT MD VISIT: next month   OBJECTIVE: (objective measures completed at initial evaluation unless otherwise dated)   DIAGNOSTIC FINDINGS: NA   PATIENT SURVEYS:  FOTO: Perceived function   53%, predicted   58%    COGNITION: Overall cognitive status: Within functional limits for tasks assessed                         SENSATION: WFL   EDEMA:  NT   POSTURE: decreased lumbar lordosis and flexed trunk    LOWER EXTREMITY ROM:            Grossly WN:s Active ROM Right eval Left eval  Hip flexion      Hip extension      Hip abduction      Hip adduction      Hip internal rotation      Hip external rotation      Knee flexion  Knee extension      Ankle dorsiflexion      Ankle plantarflexion      Ankle inversion      Ankle eversion       (Blank rows = not tested)   LOWER EXTREMITY MMT:   MMT Right eval Left eval  Hip flexion 4 4  Hip extension 4- 4-  Hip abduction 4- 4-  Hip adduction 4+ 4+  Hip internal rotation      Hip external rotation 4 4  Knee flexion      Knee extension      Ankle dorsiflexion 5 5  Ankle plantarflexion      Ankle inversion      Ankle eversion       (Blank rows = not tested)     FUNCTIONAL TESTS:  5 times sit to stand: 17.5" s use of hands 2 minute walk test: TBA 06/25/22; 395' Single leg standing: L=1", R=1"            Standing with eyes closed= 10+ sec            Berg: TBA GAIT: Distance walked: 200' Assistive device utilized: None Level of assistance: Complete Independence Comments: WNLs     TODAY'S TREATMENT: [facility] Adult PT Treatment:                                                 DATE: 06/30/22 Therapeutic Exercise: Nustep L4 LE and RUE x 5 minutes  Tandem stance trials on tandem airex 5-6 sec best  .Physical Performance Test:  BERG BALANCE TEST Sitting to Standing: 4.      Stands without using hands and stabilize independently Standing Unsupported: 4.      Stands safely for 2 minutes Sitting Unsupported: 4.     Sits for 2 minutes independently Standing to Sitting: 4.     Sits safely with minimal use of hands Transfers: 4.     Transfers safely with minor use of hands Standing with eyes closed: 4.     Stands safely for 10 seconds  Standing with feet together: 4.     Stands for 1 minute safely Reaching forward with outstretched arm: 4.     Reaches forward 10 inches Retrieving object from the floor: 4.      Able to pick up easily and safely Turning to look behind: 4.     Looks behind from both sides and weight shifts well Turning 360 degrees: 4.     Able to turn in </=4 seconds  Place alternate foot on stool: 4.     Completes 8 steps in 20 seconds     Standing with one foot in front: 4.     Independent tandem for 30 seconds  Standing on one foot: 1.     Holds <3 seconds BERG Total Score: 53 /56    [facility] Adult PT Treatment:                                                DATE: 06/25/22 Therapeutic Exercise: STS x10 s use of hands Ankle PF/DF x10 Standing hip abd x10 each Lateral steps 2x10 airex each Therapeutic Activity: SLS x5 30" 2MWT-395' somewhat short of breath  Tandem standing each                                                                                                                              [facility] Adult PT Treatment:                                                DATE: 06/09/22 Therapeutic Exercise: Developed, instructed in, and pt completed therex as noted in HEP    PATIENT EDUCATION:  Education details: Eval findings, POC, HEP  Person educated: Patient Education method: Explanation,  Demonstration, Tactile cues, Verbal cues, and Handouts Education comprehension: verbalized understanding, returned demonstration, verbal cues required, and tactile cues required   HOME EXERCISE PROGRAM: Access Code: [email or web address] URL: [email or web address] Date: 06/25/2022 Prepared by: Joellyn Rued  Exercises - Sit to Stand Without Arm Support  - 2 x daily - 7 x weekly - 1 sets - 10 reps - 3 hold - Standing Hip Abduction with Counter Support  - 2 x daily - 7 x weekly - 2 sets - 10 reps - 3 hold - Heel Toe Raises with Counter Support  - 2 x daily - 7 x weekly - 2 sets - 10 reps - 2 hold - Standing Single Leg Stance with Counter Support  - 2 x daily - 7 x weekly - 1 sets - 3 reps - 30 hold - Standing Tandem Balance with Counter Support  - 2 x daily - 7 x weekly - 3 sets - 3 reps   ASSESSMENT:   CLINICAL IMPRESSION: Pt arrives late today so session was shortened. Captured BERG balance score at 53/56 with only points lost on SLS test. Challenged balance with AIREX pad to provide unlevel surface. She was able to hold only 5-6 sec in this position. She reports current HEP is making her legs sore. No updates today.       OBJECTIVE IMPAIRMENTS: decreased balance, difficulty walking, decreased strength, obesity, and pain.    ACTIVITY LIMITATIONS: carrying, lifting, bending, standing, squatting, stairs, and locomotion level   PARTICIPATION LIMITATIONS: meal prep, cleaning, laundry, yard work, and recreation-gardening   PERSONAL FACTORS: Age, Fitness, Past/current experiences, Time since onset of injury/illness/exacerbation, and 1 comorbidity: high BMI  are also affecting patient's functional outcome.    REHAB POTENTIAL: Good   CLINICAL DECISION MAKING: Evolving/moderate complexity   EVALUATION COMPLEXITY: Moderate     GOALS:   SHORT TERM GOALS: Target date: 07/02/22 Pt will be Ind in an initial HEP Baseline: Initiated Goal status: MET   LONG TERM GOALS: Target date: 08/20/22    Pt will be Ind in a final HEP to maintain achieved LOF  Baseline: initiated Goal status: INITIAL   2.  Improve 5xSTS by MCID of 5" and by MCID of 40ft as indication of improved functional mobility  Baseline:  5xSTS=17.5' s use of hands, TBA: 06/25/22=395" Goal status: INITIAL   3.  Pt's Berg balance test will improve by 5 points as appropriate base on pt's initial score Baseline: TBA 06/30/22: 53/56 Goal status: ONGOING   4.  Increase pt's bilat hip strength to 4+/5 for improved balance  Baseline: see flow sheets Goal status: INITIAL   5.  Pt's single leg stance balnce will increase to 8" or greater for improved balance on uneven surfeces which pt encounters with gardening Baseline: Bilat 1" Goal status: INITIAL     PLAN:   PT FREQUENCY: 1x/week   PT DURATION: 8 weeks   PLANNED INTERVENTIONS: Therapeutic exercises, Therapeutic activity, Balance training, Gait training, Patient/Family education, Self Care, Joint mobilization, Dry Needling, Electrical stimulation, Cryotherapy, Moist heat, Taping, Ultrasound, Ionotophoresis 4mg /ml Dexamethasone, Manual therapy, and Re-evaluation   PLAN FOR NEXT SESSION: Review FOTO; assess response to HEP; progress therex as indicated; use of modalities, manual therapy; and TPDN as indicated.    Jannette Spanner, PTA 06/30/22 3:40 PM Phone: [phone] Fax: [phone]

## 2022-07-07 ENCOUNTER — Encounter: Payer: Self-pay | Admitting: Physical Therapy

## 2022-07-07 ENCOUNTER — Ambulatory Visit: Payer: 59 | Admitting: Physical Therapy

## 2022-07-07 DIAGNOSIS — R296 Repeated falls: Secondary | ICD-10-CM | POA: Diagnosis not present

## 2022-07-07 DIAGNOSIS — R262 Difficulty in walking, not elsewhere classified: Secondary | ICD-10-CM

## 2022-07-07 DIAGNOSIS — M25522 Pain in left elbow: Secondary | ICD-10-CM

## 2022-07-07 NOTE — Therapy (Signed)
OUTPATIENT PHYSICAL THERAPY TREATMENT NOTE   Patient Name: Leslie [name] MRN: [id number] DOB:Jul 29, 1955, 67 y.o., female Today's Date: 07/07/2022  PCP: Ethelda Chick, MD   REFERRING PROVIDER: Porfirio Oar, PA-C   END OF SESSION:   PT End of Session - 07/07/22 1357     Visit Number 4    Number of Visits 9    Date for PT Re-Evaluation 08/20/22    Authorization Type [employer] D[employer]PLETE    Authorization - Visit Number 3    Authorization - Number of Visits 27    Progress Note Due on Visit 10    PT Start Time 1400    PT Stop Time 1443    PT Time Calculation (min) 43 min             Past Medical History:  Diagnosis Date   Personal history of colonic polyps -adeanoma, sessile serrated polyp 06/12/2013   Thyroid disease    Past Surgical History:  Procedure Laterality Date   COLONOSCOPY W/ POLYPECTOMY     2 benign polyps removed   LAPAROSCOPIC GASTRIC BANDING WITH HIATAL HERNIA REPAIR  7 yrs ago   WISDOM TOOTH EXTRACTION     Patient Active Problem List   Diagnosis Date Noted   Nasal congestion 06/25/2013   Personal history of colonic polyps -adeanoma, sessile serrated polyp 06/12/2013   Injury of left elbow 02/13/2013   Unspecified hypothyroidism 02/01/2013    REFERRING DIAG: R29.6 (ICD-10-CM) - Repeated falls; elbow pain, unspecified laterally. Upper extremity pain, unspecified laterally.   THERAPY DIAG:  Repeated falls  Difficulty in walking, not elsewhere classified  Pain in left elbow  Rationale for Evaluation and Treatment Rehabilitation  ONSET DATE: Past year   SUBJECTIVE:    SUBJECTIVE STATEMENT: Hips 2/10, just some achiness.    PERTINENT HISTORY: High BMI   PAIN:  Are you having pain? Yes: NPRS scale: 2-3/10 Pain location: L Lelbow Pain description: ache Aggravating factors: Increased activity Relieving factors: Rest, exercise bands   PRECAUTIONS: None   WEIGHT BEARING RESTRICTIONS: No   FALLS:  Has patient fallen in  last 6 months? Yes. Number of falls 2 1 with her dog and 1 when carrying too many items and tripped over a threshold of a doorway   LIVING ENVIRONMENT: Lives with: lives alone Lives in: House/apartment Able to access and be mobile in her home   OCCUPATION: Retired, gardens-elevated beds and yard, cares for dog and cats   PLOF: Independent   PATIENT GOALS: Better balance and for paI elbow pain to improve   NEXT MD VISIT: next month   OBJECTIVE: (objective measures completed at initial evaluation unless otherwise dated)   DIAGNOSTIC FINDINGS: NA   PATIENT SURVEYS:  FOTO: Perceived function   53%, predicted   58%    COGNITION: Overall cognitive status: Within functional limits for tasks assessed                         SENSATION: WFL   EDEMA:  NT   POSTURE: decreased lumbar lordosis and flexed trunk    LOWER EXTREMITY ROM:            Grossly WN:s Active ROM Right eval Left eval  Hip flexion      Hip extension      Hip abduction      Hip adduction      Hip internal rotation      Hip external rotation      Knee  flexion      Knee extension      Ankle dorsiflexion      Ankle plantarflexion      Ankle inversion      Ankle eversion       (Blank rows = not tested)   LOWER EXTREMITY MMT:   MMT Right eval Left eval  Hip flexion 4 4  Hip extension 4- 4-  Hip abduction 4- 4-  Hip adduction 4+ 4+  Hip internal rotation      Hip external rotation 4 4  Knee flexion      Knee extension      Ankle dorsiflexion 5 5  Ankle plantarflexion      Ankle inversion      Ankle eversion       (Blank rows = not tested)     FUNCTIONAL TESTS:  5 times sit to stand: 17.5" s use of hands 2 minute walk test: TBA 06/25/22; 395' Single leg standing: L=1", R=1"   07/07/22: SLS: 6-9 sec best            Standing with eyes closed= 10+ sec            Berg: 53/56 GAIT: Distance walked: 200' Assistive device utilized: None Level of assistance: Complete Independence Comments: WNLs      TODAY'S TREATMENT: [facility] Adult PT Treatment:                                                DATE: 07/07/22 Therapeutic Exercise: Bridge x 10  Supine Green clam  Supine Green Band March x 10  Hip Abduction x 10 each  LTR  STS x 10 Bilat heel raises Yoga strap 3 way hip stretch bilateral  Figure 4 push and pull  Neuromuscular re-ed: Tandem stance 60 sec + SLS 6-9 sec best  Airex tandem 25-35 sec best    [facility] Adult PT Treatment:                                                DATE: 06/30/22 Therapeutic Exercise: Nustep L4 LE and RUE x 5 minutes  Tandem stance trials on tandem airex 5-6 sec best  .Physical Performance Test:  BERG BALANCE TEST Sitting to Standing: 4.      Stands without using hands and stabilize independently Standing Unsupported: 4.      Stands safely for 2 minutes Sitting Unsupported: 4.     Sits for 2 minutes independently Standing to Sitting: 4.     Sits safely with minimal use of hands Transfers: 4.     Transfers safely with minor use of hands Standing with eyes closed: 4.     Stands safely for 10 seconds  Standing with feet together: 4.     Stands for 1 minute safely Reaching forward with outstretched arm: 4.     Reaches forward 10 inches Retrieving object from the floor: 4.      Able to pick up easily and safely Turning to look behind: 4.     Looks behind from both sides and weight shifts well Turning 360 degrees: 4.     Able to turn in </=4 seconds  Place alternate foot on stool: 4.  Completes 8 steps in 20 seconds     Standing with one foot in front: 4.     Independent tandem for 30 seconds  Standing on one foot: 1.     Holds <3 seconds BERG Total Score: 53 /56    [facility] Adult PT Treatment:                                                DATE: 06/25/22 Therapeutic Exercise: STS x10 s use of hands Ankle PF/DF x10 Standing hip abd x10 each Lateral steps 2x10 airex each Therapeutic Activity: SLS x5 30" 2MWT-395' somewhat short of breath Tandem  standing each                                                                                                                              [facility] Adult PT Treatment:                                                DATE: 06/09/22 Therapeutic Exercise: Developed, instructed in, and pt completed therex as noted in HEP    PATIENT EDUCATION:  Education details: Eval findings, POC, HEP  Person educated: Patient Education method: Explanation, Demonstration, Tactile cues, Verbal cues, and Handouts Education comprehension: verbalized understanding, returned demonstration, verbal cues required, and tactile cues required   HOME EXERCISE PROGRAM: Access Code: [email or web address] URL: [email or web address] Date: 06/25/2022 Prepared by: Joellyn Rued  Exercises - Sit to Stand Without Arm Support  - 2 x daily - 7 x weekly - 1 sets - 10 reps - 3 hold - Standing Hip Abduction with Counter Support  - 2 x daily - 7 x weekly - 2 sets - 10 reps - 3 hold - Heel Toe Raises with Counter Support  - 2 x daily - 7 x weekly - 2 sets - 10 reps - 2 hold - Standing Single Leg Stance with Counter Support  - 2 x daily - 7 x weekly - 1 sets - 3 reps - 30 hold - Standing Tandem Balance with Counter Support  - 2 x daily - 7 x weekly - 3 sets - 3 reps  - Supine Bridge  - 1 x daily - 7 x weekly - 1-2 sets - 10 reps - Supine March with Resistance Band  - 1 x daily - 7 x weekly - 2 sets - 10 reps - Hooklying Clamshell with Resistance  - 2 x daily - 7 x weekly - 2 sets - 10 reps - 5 hold ASSESSMENT:   CLINICAL IMPRESSION: Pt reports compliance with HEP intermittently. She requests some exercises for while she is in bed. She tolerated mat based hip strengthening except for side hip  abduction which was painful. Able to add light resistance to standing hip abduction. Her SLS and tandem stance times have improved. Her HEP was updated today with strengthening she can perform in bed as well.      OBJECTIVE IMPAIRMENTS: decreased  balance, difficulty walking, decreased strength, obesity, and pain.    ACTIVITY LIMITATIONS: carrying, lifting, bending, standing, squatting, stairs, and locomotion level   PARTICIPATION LIMITATIONS: meal prep, cleaning, laundry, yard work, and recreation-gardening   PERSONAL FACTORS: Age, Fitness, Past/current experiences, Time since onset of injury/illness/exacerbation, and 1 comorbidity: high BMI  are also affecting patient's functional outcome.    REHAB POTENTIAL: Good   CLINICAL DECISION MAKING: Evolving/moderate complexity   EVALUATION COMPLEXITY: Moderate     GOALS:   SHORT TERM GOALS: Target date: 07/02/22 Pt will be Ind in an initial HEP Baseline: Initiated Goal status: MET   LONG TERM GOALS: Target date: 08/20/22   Pt will be Ind in a final HEP to maintain achieved LOF  Baseline: initiated Goal status: INITIAL   2.  Improve 5xSTS by MCID of 5" and by MCID of 53ft as indication of improved functional mobility  Baseline: 5xSTS=17.5' s use of hands, TBA: 06/25/22=395" Goal status: INITIAL   3.  Pt's Berg balance test will improve by 5 points as appropriate base on pt's initial score Baseline: TBA 06/30/22: 53/56 Goal status: ONGOING   4.  Increase pt's bilat hip strength to 4+/5 for improved balance  Baseline: see flow sheets Goal status: INITIAL   5.  Pt's single leg stance balnce will increase to 8" or greater for improved balance on uneven surfeces which pt encounters with gardening Baseline: Bilat 1" 07/07/22: 6-9 sec best  Goal status: improving     PLAN:   PT FREQUENCY: 1x/week   PT DURATION: 8 weeks   PLANNED INTERVENTIONS: Therapeutic exercises, Therapeutic activity, Balance training, Gait training, Patient/Family education, Self Care, Joint mobilization, Dry Needling, Electrical stimulation, Cryotherapy, Moist heat, Taping, Ultrasound, Ionotophoresis 4mg /ml Dexamethasone, Manual therapy, and Re-evaluation   PLAN FOR NEXT SESSION: Review FOTO;  assess response to HEP; progress therex as indicated; use of modalities, manual therapy; and TPDN as indicated.    Jannette Spanner, PTA 07/07/22 2:49 PM Phone: [phone] Fax: [phone]

## 2022-07-14 ENCOUNTER — Ambulatory Visit: Payer: 59

## 2022-07-14 DIAGNOSIS — M25522 Pain in left elbow: Secondary | ICD-10-CM

## 2022-07-14 DIAGNOSIS — R296 Repeated falls: Secondary | ICD-10-CM | POA: Diagnosis not present

## 2022-07-14 DIAGNOSIS — R262 Difficulty in walking, not elsewhere classified: Secondary | ICD-10-CM

## 2022-07-14 NOTE — Therapy (Signed)
OUTPATIENT PHYSICAL THERAPY TREATMENT NOTE   Patient Name: Leslie [name] MRN: [id number] DOB:08/24/1955, 67 y.o., female Today's Date: 07/14/2022  PCP: Ethelda Chick, MD   REFERRING PROVIDER: Porfirio Oar, PA-C   END OF SESSION:   PT End of Session - 07/14/22 1414     Visit Number 5    Number of Visits 9    Date for PT Re-Evaluation 08/20/22    Authorization Type [employer]PLETE    Authorization - Visit Number 4    Authorization - Number of Visits 27    Progress Note Due on Visit 10    PT Start Time 1415    PT Stop Time 1457    PT Time Calculation (min) 42 min    Activity Tolerance Patient tolerated treatment well    Behavior During Therapy [facility] for tasks assessed/performed             Past Medical History:  Diagnosis Date   Personal history of colonic polyps -adeanoma, sessile serrated polyp 06/12/2013   Thyroid disease    Past Surgical History:  Procedure Laterality Date   COLONOSCOPY W/ POLYPECTOMY     2 benign polyps removed   LAPAROSCOPIC GASTRIC BANDING WITH HIATAL HERNIA REPAIR  7 yrs ago   WISDOM TOOTH EXTRACTION     Patient Active Problem List   Diagnosis Date Noted   Nasal congestion 06/25/2013   Personal history of colonic polyps -adeanoma, sessile serrated polyp 06/12/2013   Injury of left elbow 02/13/2013   Unspecified hypothyroidism 02/01/2013    REFERRING DIAG: R29.6 (ICD-10-CM) - Repeated falls; elbow pain, unspecified laterally. Upper extremity pain, unspecified laterally.   THERAPY DIAG:  Repeated falls  Difficulty in walking, not elsewhere classified  Pain in left elbow  Rationale for Evaluation and Treatment Rehabilitation  ONSET DATE: Past year   SUBJECTIVE:    SUBJECTIVE STATEMENT: Pt reports she feels stronger and she has greater ease of mobility.   PERTINENT HISTORY: High BMI   PAIN:  Are you having pain? Yes: NPRS scale: 0/10 Pain location: L elbow Pain description: ache Aggravating factors:  Increased activity Relieving factors: Rest, exercise bands   PRECAUTIONS: None   WEIGHT BEARING RESTRICTIONS: No   FALLS:  Has patient fallen in last 6 months? Yes. Number of falls 2 1 with her dog and 1 when carrying too many items and tripped over a threshold of a doorway   LIVING ENVIRONMENT: Lives with: lives alone Lives in: House/apartment Able to access and be mobile in her home   OCCUPATION: Retired, gardens-elevated beds and yard, cares for dog and cats   PLOF: Independent   PATIENT GOALS: Better balance and for paI elbow pain to improve   NEXT MD VISIT: next month   OBJECTIVE: (objective measures completed at initial evaluation unless otherwise dated)   DIAGNOSTIC FINDINGS: NA   PATIENT SURVEYS:  FOTO: Perceived function   53%, predicted   58%    COGNITION: Overall cognitive status: Within functional limits for tasks assessed                         SENSATION: WFL   EDEMA:  NT   POSTURE: decreased lumbar lordosis and flexed trunk    LOWER EXTREMITY ROM:            Grossly WN:s Active ROM Right eval Left eval  Hip flexion      Hip extension      Hip abduction  Hip adduction      Hip internal rotation      Hip external rotation      Knee flexion      Knee extension      Ankle dorsiflexion      Ankle plantarflexion      Ankle inversion      Ankle eversion       (Blank rows = not tested)   LOWER EXTREMITY MMT:   MMT Right eval Left eval  Hip flexion 4 4  Hip extension 4- 4-  Hip abduction 4- 4-  Hip adduction 4+ 4+  Hip internal rotation      Hip external rotation 4 4  Knee flexion      Knee extension      Ankle dorsiflexion 5 5  Ankle plantarflexion      Ankle inversion      Ankle eversion       (Blank rows = not tested)     FUNCTIONAL TESTS:  5 times sit to stand: 17.5" s use of hands 2 minute walk test: TBA 06/25/22; 395' Single leg standing: L=1", R=1"   07/07/22: SLS: 6-9 sec best            Standing with eyes closed= 10+  sec            Berg: 53/56 GAIT: Distance walked: 200' Assistive device utilized: None Level of assistance: Complete Independence Comments: WNLs  [facility] Adult PT Treatment:                                                DATE: 07/14/22 Therapeutic Exercise: Bridge x10 5" Supine Green clam  x10 5" Supine Green Band March x10  each Hip Abduction x 10 each  LTR x5 5" STS x 10 Bilat heel raises Yoga strap 3 way hip stretch bilateral  Figure 4 push and pull Therapeutic Activity: SL balance Lateral step ups 4" x10 each Marching on airex Airex tandem  TODAY'S TREATMENT: [facility] Adult PT Treatment:                                                DATE: 07/07/22 Therapeutic Exercise: Bridge x 10  Supine Green clam  Supine Green Band March x 10  Hip Abduction x 10 each  LTR  STS x 10 Bilat heel raises Yoga strap 3 way hip stretch bilateral  Figure 4 push and pull  Neuromuscular re-ed: Tandem stance 60 sec + SLS 6-9 sec best  Airex tandem 25-35 sec best  [facility] Adult PT Treatment:                                                DATE: 06/30/22 Therapeutic Exercise: Nustep L4 LE and RUE x 5 minutes  Tandem stance trials on tandem airex 5-6 sec best  .Physical Performance Test:  BERG BALANCE TEST Sitting to Standing: 4.      Stands without using hands and stabilize independently Standing Unsupported: 4.      Stands safely for 2 minutes Sitting Unsupported: 4.     Sits for 2  minutes independently Standing to Sitting: 4.     Sits safely with minimal use of hands Transfers: 4.     Transfers safely with minor use of hands Standing with eyes closed: 4.     Stands safely for 10 seconds  Standing with feet together: 4.     Stands for 1 minute safely Reaching forward with outstretched arm: 4.     Reaches forward 10 inches Retrieving object from the floor: 4.      Able to pick up easily and safely Turning to look behind: 4.     Looks behind from both sides and weight shifts well Turning 360  degrees: 4.     Able to turn in </=4 seconds  Place alternate foot on stool: 4.     Completes 8 steps in 20 seconds     Standing with one foot in front: 4.     Independent tandem for 30 seconds  Standing on one foot: 1.     Holds <3 seconds BERG Total Score: 53 /56    PATIENT EDUCATION:  Education details: Eval findings, POC, HEP  Person educated: Patient Education method: Explanation, Demonstration, Tactile cues, Verbal cues, and Handouts Education comprehension: verbalized understanding, returned demonstration, verbal cues required, and tactile cues required   HOME EXERCISE PROGRAM: Access Code: [email or web address] URL: [email or web address] Date: 07/14/2022 Prepared by: Joellyn Rued  Exercises - Sit to Stand Without Arm Support  - 2 x daily - 7 x weekly - 1 sets - 10 reps - 3 hold - Standing Hip Abduction with Counter Support  - 2 x daily - 7 x weekly - 2 sets - 10 reps - 3 hold - Heel Toe Raises with Counter Support  - 2 x daily - 7 x weekly - 2 sets - 10 reps - 2 hold - Standing Single Leg Stance with Counter Support  - 2 x daily - 7 x weekly - 1 sets - 3 reps - 30 hold - Standing Tandem Balance with Counter Support  - 2 x daily - 7 x weekly - 3 sets - 3 reps - Supine Bridge  - 1 x daily - 7 x weekly - 1-2 sets - 10 reps - 5 hold - Supine March with Resistance Band  - 1 x daily - 7 x weekly - 2 sets - 10 reps - 1 hold - Hooklying Clamshell with Resistance  - 2 x daily - 7 x weekly - 2 sets - 10 reps - 5 hold - Supine Piriformis Stretch with Foot on Ground  - 1 x daily - 7 x weekly - 1 sets - 2-3 reps - 20 hold - Supine Figure 4 Piriformis Stretch  - 1 x daily - 7 x weekly - 1 sets - 2-3 reps - 20 hold  ASSESSMENT:   CLINICAL IMPRESSION: PT was completed for lumbopelvic/LE strengthening and flexibility, as well as balance activities. Pt notes imprved LE strength and ease of mobility. Pt tolerated PT today without adverse effects. Pt will continue to benefit from skilled PT to  address impairments for improved function. Will assess balance activities the next PT session.      OBJECTIVE IMPAIRMENTS: decreased balance, difficulty walking, decreased strength, obesity, and pain.    ACTIVITY LIMITATIONS: carrying, lifting, bending, standing, squatting, stairs, and locomotion level   PARTICIPATION LIMITATIONS: meal prep, cleaning, laundry, yard work, and recreation-gardening   PERSONAL FACTORS: Age, Fitness, Past/current experiences, Time since onset of injury/illness/exacerbation, and 1 comorbidity: high BMI  are also affecting patient's  functional outcome.    REHAB POTENTIAL: Good   CLINICAL DECISION MAKING: Evolving/moderate complexity   EVALUATION COMPLEXITY: Moderate     GOALS:   SHORT TERM GOALS: Target date: 07/02/22 Pt will be Ind in an initial HEP Baseline: Initiated Goal status: MET   LONG TERM GOALS: Target date: 08/20/22   Pt will be Ind in a final HEP to maintain achieved LOF  Baseline: initiated Goal status: INITIAL   2.  Improve 5xSTS by MCID of 5" and by MCID of 50ft as indication of improved functional mobility  Baseline: 5xSTS=17.5' s use of hands, TBA: 06/25/22=395" Goal status: INITIAL   3.  Pt's Berg balance test will improve by 5 points as appropriate base on pt's initial score Baseline: TBA 06/30/22: 53/56 Goal status: ONGOING   4.  Increase pt's bilat hip strength to 4+/5 for improved balance  Baseline: see flow sheets Goal status: INITIAL   5.  Pt's single leg stance balnce will increase to 8" or greater for improved balance on uneven surfeces which pt encounters with gardening Baseline: Bilat 1" 07/07/22: 6-9 sec best  Goal status: improving     PLAN:   PT FREQUENCY: 1x/week   PT DURATION: 8 weeks   PLANNED INTERVENTIONS: Therapeutic exercises, Therapeutic activity, Balance training, Gait training, Patient/Family education, Self Care, Joint mobilization, Dry Needling, Electrical stimulation, Cryotherapy, Moist  heat, Taping, Ultrasound, Ionotophoresis 4mg /ml Dexamethasone, Manual therapy, and Re-evaluation   PLAN FOR NEXT SESSION: Review FOTO; assess response to HEP; progress therex as indicated; use of modalities, manual therapy; and TPDN as indicated.    Jarelyn Bambach MS, PT 07/14/22 3:03 PM

## 2022-07-20 NOTE — Therapy (Signed)
OUTPATIENT PHYSICAL THERAPY TREATMENT NOTE   Patient Name: Leslie [name] MRN: [id number] DOB:01/02/1956, 67 y.o., female Today's Date: 07/21/2022  PCP: Ethelda Chick, MD   REFERRING PROVIDER: Porfirio Oar, PA-C   END OF SESSION:   PT End of Session - 07/21/22 1416     Visit Number 6    Number of Visits 9    Date for PT Re-Evaluation 08/20/22    Authorization Type [employer] D[employer]PLETE    Authorization - Visit Number 5    Authorization - Number of Visits 27    Progress Note Due on Visit 10    PT Start Time 1417    PT Stop Time 1459    PT Time Calculation (min) 42 min    Activity Tolerance Patient tolerated treatment well    Behavior During Therapy [facility] for tasks assessed/performed             Past Medical History:  Diagnosis Date   Personal history of colonic polyps -adeanoma, sessile serrated polyp 06/12/2013   Thyroid disease    Past Surgical History:  Procedure Laterality Date   COLONOSCOPY W/ POLYPECTOMY     2 benign polyps removed   LAPAROSCOPIC GASTRIC BANDING WITH HIATAL HERNIA REPAIR  7 yrs ago   WISDOM TOOTH EXTRACTION     Patient Active Problem List   Diagnosis Date Noted   Nasal congestion 06/25/2013   Personal history of colonic polyps -adeanoma, sessile serrated polyp 06/12/2013   Injury of left elbow 02/13/2013   Unspecified hypothyroidism 02/01/2013    REFERRING DIAG: R29.6 (ICD-10-CM) - Repeated falls; elbow pain, unspecified laterally. Upper extremity pain, unspecified laterally.   THERAPY DIAG:  Repeated falls  Difficulty in walking, not elsewhere classified  Pain in left elbow  Rationale for Evaluation and Treatment Rehabilitation  ONSET DATE: Past year   SUBJECTIVE:    SUBJECTIVE STATEMENT: Pt reports she has been putting in her garden and she has not not doing her HEP as often. Pt notes she is pleased with her balance with the bending involved with gardening.   PERTINENT HISTORY: High BMI   PAIN:  Are you  having pain? Yes: NPRS scale: 0/10 Pain location: L elbow Pain description: ache Aggravating factors: Increased activity Relieving factors: Rest, exercise bands   PRECAUTIONS: None   WEIGHT BEARING RESTRICTIONS: No   FALLS:  Has patient fallen in last 6 months? Yes. Number of falls 2 1 with her dog and 1 when carrying too many items and tripped over a threshold of a doorway   LIVING ENVIRONMENT: Lives with: lives alone Lives in: House/apartment Able to access and be mobile in her home   OCCUPATION: Retired, gardens-elevated beds and yard, cares for dog and cats   PLOF: Independent   PATIENT GOALS: Better balance and for paI elbow pain to improve   NEXT MD VISIT: next month   OBJECTIVE: (objective measures completed at initial evaluation unless otherwise dated)   DIAGNOSTIC FINDINGS: NA   PATIENT SURVEYS:  FOTO: Perceived function   53%, predicted   58%    COGNITION: Overall cognitive status: Within functional limits for tasks assessed                         SENSATION: WFL   EDEMA:  NT   POSTURE: decreased lumbar lordosis and flexed trunk    LOWER EXTREMITY ROM:            Grossly WN:s Active ROM Right eval Left eval  Hip flexion      Hip extension      Hip abduction      Hip adduction      Hip internal rotation      Hip external rotation      Knee flexion      Knee extension      Ankle dorsiflexion      Ankle plantarflexion      Ankle inversion      Ankle eversion       (Blank rows = not tested)   LOWER EXTREMITY MMT:   MMT Right eval Left eval  Hip flexion 4 4  Hip extension 4- 4-  Hip abduction 4- 4-  Hip adduction 4+ 4+  Hip internal rotation      Hip external rotation 4 4  Knee flexion      Knee extension      Ankle dorsiflexion 5 5  Ankle plantarflexion      Ankle inversion      Ankle eversion       (Blank rows = not tested)     FUNCTIONAL TESTS:  5 times sit to stand: 17.5" s use of hands 2 minute walk test: TBA 06/25/22;  395' Single leg standing: L=1", R=1"   07/07/22: SLS: 6-9 sec best            Standing with eyes closed= 10+ sec            Berg: 53/56 GAIT: Distance walked: 200' Assistive device utilized: None Level of assistance: Complete Independence Comments: WNLs  [facility] Adult PT Treatment:                                                DATE: 07/21/22 Therapeutic Exercise: Standing Hip Extension 2 x 10 each 3# Standing Hip Abduction 2 x 10 each 3# Standing knee flexion 2 x 10 each 3# LTR x5 5" STS x 10 Bilat heel raises 2x10 Therapeutic Activity: SL balance Marching on airex // bars dynamic balance- braiding and heel to toe walking. Min use of hands on // bars  [facility] Adult PT Treatment:                                                DATE: 07/14/22 Therapeutic Exercise: Bridge x10 5" Supine Green clam  x10 5" Supine Green Band March x10  each Hip Abduction x 10 each  LTR x5 5" STS x 10 Bilat heel raises Yoga strap 3 way hip stretch bilateral  Figure 4 push and pull Therapeutic Activity: SL balance Lateral step ups 4" x10 each Marching on airex Airex tandem  TODAY'S TREATMENT: [facility] Adult PT Treatment:                                                DATE: 07/07/22 Therapeutic Exercise: Bridge x 10  Supine Green clam  Supine Green Band March x 10  Hip Abduction x 10 each  LTR  STS x 10 Bilat heel raises Yoga strap 3 way hip stretch bilateral  Figure 4 push and pull  Neuromuscular re-ed: Tandem  stance 60 sec + SLS 6-9 sec best  Airex tandem 25-35 sec best  [facility] Adult PT Treatment:                                                DATE: 06/30/22 Therapeutic Exercise: Nustep L4 LE and RUE x 5 minutes  Tandem stance trials on tandem airex 5-6 sec best  .Physical Performance Test:  BERG BALANCE TEST Sitting to Standing: 4.      Stands without using hands and stabilize independently Standing Unsupported: 4.      Stands safely for 2 minutes Sitting Unsupported: 4.     Sits for 2  minutes independently Standing to Sitting: 4.     Sits safely with minimal use of hands Transfers: 4.     Transfers safely with minor use of hands Standing with eyes closed: 4.     Stands safely for 10 seconds  Standing with feet together: 4.     Stands for 1 minute safely Reaching forward with outstretched arm: 4.     Reaches forward 10 inches Retrieving object from the floor: 4.      Able to pick up easily and safely Turning to look behind: 4.     Looks behind from both sides and weight shifts well Turning 360 degrees: 4.     Able to turn in </=4 seconds  Place alternate foot on stool: 4.     Completes 8 steps in 20 seconds     Standing with one foot in front: 4.     Independent tandem for 30 seconds  Standing on one foot: 1.     Holds <3 seconds BERG Total Score: 53 /56    PATIENT EDUCATION:  Education details: Eval findings, POC, HEP  Person educated: Patient Education method: Explanation, Demonstration, Tactile cues, Verbal cues, and Handouts Education comprehension: verbalized understanding, returned demonstration, verbal cues required, and tactile cues required   HOME EXERCISE PROGRAM: Access Code: [email or web address] URL: [email or web address] Date: 07/14/2022 Prepared by: Joellyn Rued  Exercises - Sit to Stand Without Arm Support  - 2 x daily - 7 x weekly - 1 sets - 10 reps - 3 hold - Standing Hip Abduction with Counter Support  - 2 x daily - 7 x weekly - 2 sets - 10 reps - 3 hold - Heel Toe Raises with Counter Support  - 2 x daily - 7 x weekly - 2 sets - 10 reps - 2 hold - Standing Single Leg Stance with Counter Support  - 2 x daily - 7 x weekly - 1 sets - 3 reps - 30 hold - Standing Tandem Balance with Counter Support  - 2 x daily - 7 x weekly - 3 sets - 3 reps - Supine Bridge  - 1 x daily - 7 x weekly - 1-2 sets - 10 reps - 5 hold - Supine March with Resistance Band  - 1 x daily - 7 x weekly - 2 sets - 10 reps - 1 hold - Hooklying Clamshell with Resistance  - 2 x daily  - 7 x weekly - 2 sets - 10 reps - 5 hold - Supine Piriformis Stretch with Foot on Ground  - 1 x daily - 7 x weekly - 1 sets - 2-3 reps - 20 hold - Supine Figure 4 Piriformis Stretch  - 1 x daily -  7 x weekly - 1 sets - 2-3 reps - 20 hold  ASSESSMENT:   CLINICAL IMPRESSION: PT was completed for LE strengthening and balance activities in the [facility]. Pt's SL balance has improved bilat and met this LTG. With dynamic balance activities, hand assist was needed minimally for balance.  Pt tolerated prescribed PT without adverse effects. Pt will continue to benefit from skilled PT to address impairments for improved function.   OBJECTIVE IMPAIRMENTS: decreased balance, difficulty walking, decreased strength, obesity, and pain.    ACTIVITY LIMITATIONS: carrying, lifting, bending, standing, squatting, stairs, and locomotion level   PARTICIPATION LIMITATIONS: meal prep, cleaning, laundry, yard work, and recreation-gardening   PERSONAL FACTORS: Age, Fitness, Past/current experiences, Time since onset of injury/illness/exacerbation, and 1 comorbidity: high BMI  are also affecting patient's functional outcome.    REHAB POTENTIAL: Good   CLINICAL DECISION MAKING: Evolving/moderate complexity   EVALUATION COMPLEXITY: Moderate     GOALS:   SHORT TERM GOALS: Target date: 07/02/22 Pt will be Ind in an initial HEP Baseline: Initiated Goal status: MET   LONG TERM GOALS: Target date: 08/20/22   Pt will be Ind in a final HEP to maintain achieved LOF  Baseline: initiated Goal status: INITIAL   2.  Improve 5xSTS by MCID of 5" and by MCID of 82ft as indication of improved functional mobility  Baseline: 5xSTS=17.5' s use of hands, TBA: 06/25/22=395" Goal status: INITIAL   3.  Pt's Berg balance test will improve by 5 points as appropriate base on pt's initial score Baseline: TBA 06/30/22: 53/56 Goal status: ONGOING   4.  Increase pt's bilat hip strength to 4+/5 for improved balance  Baseline: see  flow sheets Goal status: INITIAL   5.  Pt's single leg stance balnce will increase to 8" or greater for improved balance on uneven surfeces which pt encounters with gardening Baseline: Bilat 1" 07/07/22: 6-9 sec best 07/21/22 L 4,15,16=11.6" R 5,15, 20=13.3" Goal status: MET     PLAN:   PT FREQUENCY: 1x/week   PT DURATION: 8 weeks   PLANNED INTERVENTIONS: Therapeutic exercises, Therapeutic activity, Balance training, Gait training, Patient/Family education, Self Care, Joint mobilization, Dry Needling, Electrical stimulation, Cryotherapy, Moist heat, Taping, Ultrasound, Ionotophoresis 4mg /ml Dexamethasone, Manual therapy, and Re-evaluation   PLAN FOR NEXT SESSION: Review FOTO; assess response to HEP; progress therex as indicated; use of modalities, manual therapy; and TPDN as indicated.    Diarra Kos MS, PT 07/21/22 3:01 PM

## 2022-07-21 ENCOUNTER — Ambulatory Visit: Payer: 59 | Attending: Family Medicine

## 2022-07-21 DIAGNOSIS — R262 Difficulty in walking, not elsewhere classified: Secondary | ICD-10-CM | POA: Diagnosis present

## 2022-07-21 DIAGNOSIS — M25522 Pain in left elbow: Secondary | ICD-10-CM | POA: Insufficient documentation

## 2022-07-21 DIAGNOSIS — R296 Repeated falls: Secondary | ICD-10-CM | POA: Insufficient documentation

## 2022-07-27 NOTE — Therapy (Addendum)
OUTPATIENT PHYSICAL THERAPY TREATMENT NOTE/Discharge   Patient Name: Leslie [name] MRN: [id number] DOB:1955/06/04, 67 y.o., female Today's Date: 07/28/2022  PCP: Ethelda Chick, MD   REFERRING PROVIDER: Porfirio Oar, PA-C   END OF SESSION:   PT End of Session - 07/28/22 1431     Visit Number 7    Number of Visits 9    Date for PT Re-Evaluation 08/20/22    Authorization Type [employer] D[employer] COMPLETE    Authorization - Visit Number 6    Authorization - Number of Visits 27    Progress Note Due on Visit 10    PT Start Time 1430   14 mins late for appt   PT Stop Time 1500    PT Time Calculation (min) 30 min    Activity Tolerance Patient tolerated treatment well    Behavior During Therapy [facility] for tasks assessed/performed             Past Medical History:  Diagnosis Date   Personal history of colonic polyps -adeanoma, sessile serrated polyp 06/12/2013   Thyroid disease    Past Surgical History:  Procedure Laterality Date   COLONOSCOPY W/ POLYPECTOMY     2 benign polyps removed   LAPAROSCOPIC GASTRIC BANDING WITH HIATAL HERNIA REPAIR  7 yrs ago   WISDOM TOOTH EXTRACTION     Patient Active Problem List   Diagnosis Date Noted   Nasal congestion 06/25/2013   Personal history of colonic polyps -adeanoma, sessile serrated polyp 06/12/2013   Injury of left elbow 02/13/2013   Unspecified hypothyroidism 02/01/2013    REFERRING DIAG: R29.6 (ICD-10-CM) - Repeated falls; elbow pain, unspecified laterally. Upper extremity pain, unspecified laterally.   THERAPY DIAG:  Repeated falls  Difficulty in walking, not elsewhere classified  Pain in left elbow  Rationale for Evaluation and Treatment Rehabilitation  ONSET DATE: Past year   SUBJECTIVE:    SUBJECTIVE STATEMENT: Pt reports no recent issues with balance.   PERTINENT HISTORY: High BMI   PAIN:  Are you having pain? Yes: NPRS scale: 0/10 Pain location: L elbow Pain description: ache Aggravating  factors: Increased activity Relieving factors: Rest, exercise bands   PRECAUTIONS: None   WEIGHT BEARING RESTRICTIONS: No   FALLS:  Has patient fallen in last 6 months? Yes. Number of falls 2 1 with her dog and 1 when carrying too many items and tripped over a threshold of a doorway   LIVING ENVIRONMENT: Lives with: lives alone Lives in: House/apartment Able to access and be mobile in her home   OCCUPATION: Retired, gardens-elevated beds and yard, cares for dog and cats   PLOF: Independent   PATIENT GOALS: Better balance and for paI elbow pain to improve   NEXT MD VISIT: next month   OBJECTIVE: (objective measures completed at initial evaluation unless otherwise dated)   DIAGNOSTIC FINDINGS: NA   PATIENT SURVEYS:  FOTO: Perceived function   53%, predicted   58%    COGNITION: Overall cognitive status: Within functional limits for tasks assessed                         SENSATION: WFL   EDEMA:  NT   POSTURE: decreased lumbar lordosis and flexed trunk    LOWER EXTREMITY ROM:            Grossly WN:s Active ROM Right eval Left eval  Hip flexion      Hip extension      Hip abduction  Hip adduction      Hip internal rotation      Hip external rotation      Knee flexion      Knee extension      Ankle dorsiflexion      Ankle plantarflexion      Ankle inversion      Ankle eversion       (Blank rows = not tested)   LOWER EXTREMITY MMT:   MMT Right eval Left eval  Hip flexion 4 4  Hip extension 4- 4-  Hip abduction 4- 4-  Hip adduction 4+ 4+  Hip internal rotation      Hip external rotation 4 4  Knee flexion      Knee extension      Ankle dorsiflexion 5 5  Ankle plantarflexion      Ankle inversion      Ankle eversion       (Blank rows = not tested)     FUNCTIONAL TESTS:  5 times sit to stand: 17.5" s use of hands 2 minute walk test: TBA 06/25/22; 395' Single leg standing: L=1", R=1"   07/07/22: SLS: 6-9 sec best            Standing with eyes  closed= 10+ sec            Berg: 53/56 GAIT: Distance walked: 200' Assistive device utilized: None Level of assistance: Complete Independence Comments: WNLs  [facility] Adult PT Treatment:                                                DATE: 07/28/22 Therapeutic Exercise: Therapeutic Exercise: Standing Hip Extension 2 x 10 each 3# Standing Hip Abduction 2 x 10 each 3# Standing knee flexion 2 x 10 each 3# STS x 10 Bilat heel raises 2x10 Therapeutic Activity: SL balance Tandem standing s and c head nods Narrow stance, on airex, EC Marching on airex  [facility] Adult PT Treatment:                                                DATE: 07/21/22 Therapeutic Exercise: Standing Hip Extension 2 x 10 each 3# Standing Hip Abduction 2 x 10 each 3# Standing knee flexion 2 x 10 each 3# LTR x5 5" STS x 10 Bilat heel raises 2x10 Therapeutic Activity: SL balance Marching on airex // bars dynamic balance- braiding and heel to toe walking. Min use of hands on // bars  [facility] Adult PT Treatment:                                                DATE: 07/14/22 Therapeutic Exercise: Bridge x10 5" Supine Green clam  x10 5" Supine Green Band March x10  each Hip Abduction x 10 each  LTR x5 5" STS x 10 Bilat heel raises Yoga strap 3 way hip stretch bilateral  Figure 4 push and pull Therapeutic Activity: SL balance Lateral step ups 4" x10 each Marching on airex Airex tandem  TODAY'S TREATMENT: [facility] Adult PT Treatment:  DATE: 07/07/22 Therapeutic Exercise: Bridge x 10  Supine Green clam  Supine Green Band March x 10  Hip Abduction x 10 each  LTR  STS x 10 Bilat heel raises Yoga strap 3 way hip stretch bilateral  Figure 4 push and pull  Neuromuscular re-ed: Tandem stance 60 sec + SLS 6-9 sec best  Airex tandem 25-35 sec best   PATIENT EDUCATION:  Education details: Eval findings, POC, HEP  Person educated: Patient Education method: Explanation,  Demonstration, Tactile cues, Verbal cues, and Handouts Education comprehension: verbalized understanding, returned demonstration, verbal cues required, and tactile cues required   HOME EXERCISE PROGRAM: Access Code: [email or web address] URL: [email or web address] Date: 07/14/2022 Prepared by: Leslie [name]  Exercises - Sit to Stand Without Arm Support  - 2 x daily - 7 x weekly - 1 sets - 10 reps - 3 hold - Standing Hip Abduction with Counter Support  - 2 x daily - 7 x weekly - 2 sets - 10 reps - 3 hold - Heel Toe Raises with Counter Support  - 2 x daily - 7 x weekly - 2 sets - 10 reps - 2 hold - Standing Single Leg Stance with Counter Support  - 2 x daily - 7 x weekly - 1 sets - 3 reps - 30 hold - Standing Tandem Balance with Counter Support  - 2 x daily - 7 x weekly - 3 sets - 3 reps - Supine Bridge  - 1 x daily - 7 x weekly - 1-2 sets - 10 reps - 5 hold - Supine March with Resistance Band  - 1 x daily - 7 x weekly - 2 sets - 10 reps - 1 hold - Hooklying Clamshell with Resistance  - 2 x daily - 7 x weekly - 2 sets - 10 reps - 5 hold - Supine Piriformis Stretch with Foot on Ground  - 1 x daily - 7 x weekly - 1 sets - 2-3 reps - 20 hold - Supine Figure 4 Piriformis Stretch  - 1 x daily - 7 x weekly - 1 sets - 2-3 reps - 20 hold  ASSESSMENT:   CLINICAL IMPRESSION: The PT session was limited with pt arriving late due to traffic. Continued PT for balance activities and LE strengthening. Pt continues to demonstrate improved single leg stance. Pt tolerated the prescribed PT without adverse effects. Will reassess goals and if the pt continues to demonstrate improved functional ability and balance, anticipate DC from PT services the next session.   OBJECTIVE IMPAIRMENTS: decreased balance, difficulty walking, decreased strength, obesity, and pain.    ACTIVITY LIMITATIONS: carrying, lifting, bending, standing, squatting, stairs, and locomotion level   PARTICIPATION LIMITATIONS: meal prep,  cleaning, laundry, yard work, and recreation-gardening   PERSONAL FACTORS: Age, Fitness, Past/current experiences, Time since onset of injury/illness/exacerbation, and 1 comorbidity: high BMI  are also affecting patient's functional outcome.    REHAB POTENTIAL: Good   CLINICAL DECISION MAKING: Evolving/moderate complexity   EVALUATION COMPLEXITY: Moderate     GOALS:   SHORT TERM GOALS: Target date: 07/02/22 Pt will be Ind in an initial HEP Baseline: Initiated Goal status: MET   LONG TERM GOALS: Target date: 08/20/22   Pt will be Ind in a final HEP to maintain achieved LOF  Baseline: initiated Goal status: INITIAL   2.  Improve 5xSTS by MCID of 5" and by MCID of 78ft as indication of improved functional mobility  Baseline: 5xSTS=17.5' s use of hands, TBA: 06/25/22=395" Goal status: INITIAL  3.  Pt's Berg balance test will improve by 5 points as appropriate base on pt's initial score Baseline: TBA 06/30/22: 53/56 Goal status: ONGOING   4.  Increase pt's bilat hip strength to 4+/5 for improved balance  Baseline: see flow sheets Goal status: INITIAL   5.  Pt's single leg stance balnce will increase to 8" or greater for improved balance on uneven surfeces which pt encounters with gardening Baseline: Bilat 1" 07/07/22: 6-9 sec best 07/21/22 L 4,15,16=11.6" R 5,15, 20=13.3" Goal status: MET     PLAN:   PT FREQUENCY: 1x/week   PT DURATION: 8 weeks   PLANNED INTERVENTIONS: Therapeutic exercises, Therapeutic activity, Balance training, Gait training, Patient/Family education, Self Care, Joint mobilization, Dry Needling, Electrical stimulation, Cryotherapy, Moist heat, Taping, Ultrasound, Ionotophoresis 4mg /ml Dexamethasone, Manual therapy, and Re-evaluation   PLAN FOR NEXT SESSION: Review FOTO; assess response to HEP; progress therex as indicated; use of modalities, manual therapy; and TPDN as indicated.    Leslie Buonomo MS, PT 07/28/22 4:26 PM  PHYSICAL THERAPY  DISCHARGE SUMMARY  Visits from Start of Care: 7  Current functional level related to goals / functional outcomes: See clinical impression and PT goals    Remaining deficits: See clinical impression and PT goals    Education / Equipment: HEP. Pt Ed   Patient agrees to discharge. Patient goals were partially met. Patient is being discharged due to not returning since the last visit.  Leslie Skibinski MS, PT 03/09/23 5:19 PM

## 2022-07-28 ENCOUNTER — Ambulatory Visit: Payer: 59

## 2022-07-28 DIAGNOSIS — R296 Repeated falls: Secondary | ICD-10-CM

## 2022-07-28 DIAGNOSIS — R262 Difficulty in walking, not elsewhere classified: Secondary | ICD-10-CM

## 2022-07-28 DIAGNOSIS — M25522 Pain in left elbow: Secondary | ICD-10-CM

## 2022-11-03 ENCOUNTER — Other Ambulatory Visit: Payer: Self-pay

## 2022-11-03 ENCOUNTER — Ambulatory Visit (INDEPENDENT_AMBULATORY_CARE_PROVIDER_SITE_OTHER): Payer: 59 | Admitting: Obstetrics and Gynecology

## 2022-11-03 ENCOUNTER — Other Ambulatory Visit (HOSPITAL_COMMUNITY)
Admission: RE | Admit: 2022-11-03 | Discharge: 2022-11-03 | Disposition: A | Discharge status: Home / Self Care | Payer: 59 | Source: Ambulatory Visit | Attending: Obstetrics and Gynecology | Admitting: Obstetrics and Gynecology

## 2022-11-03 ENCOUNTER — Encounter: Payer: Self-pay | Admitting: Obstetrics and Gynecology

## 2022-11-03 VITALS — BP 108/66 | HR 67 | Wt 240.5 lb

## 2022-11-03 DIAGNOSIS — L292 Pruritus vulvae: Secondary | ICD-10-CM | POA: Insufficient documentation

## 2022-11-03 DIAGNOSIS — K629 Disease of anus and rectum, unspecified: Secondary | ICD-10-CM | POA: Diagnosis not present

## 2022-11-03 MED ORDER — CLOBETASOL PROPIONATE 0.05 % EX OINT: TOPICAL_OINTMENT | CUTANEOUS | 5 refills | Status: DC

## 2022-11-03 MED ORDER — AQUAPHOR EX OINT: TOPICAL_OINTMENT | CUTANEOUS | 0 refills | Status: AC | PRN

## 2022-11-03 MED ORDER — HYDROXYZINE HCL 25 MG PO TABS: 25.0000 mg | ORAL_TABLET | Freq: Every evening | ORAL | 2 refills | Status: DC | PRN

## 2022-11-03 NOTE — Progress Notes (Signed)
NEW GYNECOLOGY PATIENT Patient name: Leslie [name] MRN [id number]  Date of birth: April 08, 1955 Chief Complaint:   Gynecologic Exam     History:  Leslie [name] is a 67 y.o. No obstetric history on file. being seen today for vulvovaginal itching.    Her doctor prescribed her birth control pilsl for her itching. She has had itching for a few years. Assumed it was yeast and would treat according. Didn't want horse hrmone so she was started on birth control instead; after 1 week it stopped. Not having any bleeding any more. Also notes having sores and will have bleeding from the sores. Has tried another cream but the cream caused the stores. Currently using an anti-itch cream. Can use desiten but that burns as well . Having sores for 2 years and they often burn.      Gynecologic History No LMP recorded. Patient is postmenopausal. Contraception: abstinence Last Pap: 11/2018. NILM, HPV negative Last Mammogram: 2017 BIRADS 1 Last Colonoscopy: 2015  Obstetric History OB History  No obstetric history on file.    Past Medical History:  Diagnosis Date   Personal history of colonic polyps -adeanoma, sessile serrated polyp 06/12/2013   Thyroid disease     Past Surgical History:  Procedure Laterality Date   COLONOSCOPY W/ POLYPECTOMY     2 benign polyps removed   LAPAROSCOPIC GASTRIC BANDING WITH HIATAL HERNIA REPAIR  7 yrs ago   WISDOM TOOTH EXTRACTION      Current Outpatient Medications on File Prior to Visit  Medication Sig Dispense Refill   FLUoxetine (PROZAC) 20 MG capsule TAKE ONE CAPSULE BY MOUTH EVERY DAY 30 capsule 0   levothyroxine (SYNTHROID, LEVOTHROID) 150 MCG tablet TAKE ONE TABLET BY MOUTH EVERY DAY BEFORE BREAKFAST **NEED TO RECHECK TSH BEFORE LAST REFILL** 30 tablet 0   acetaminophen (TYLENOL) 325 MG tablet Take 650 mg by mouth every 6 (six) hours as needed.     aspirin EC 81 MG tablet Take 1 tablet (81 mg total) by mouth daily. 60 tablet 5   fluticasone (FLONASE) 50  MCG/ACT nasal spray Place 2 sprays into both nostrils daily. 16 g 11   fluticasone (VERAMYST) 27.5 MCG/SPRAY nasal spray Place 1 spray into the nose daily. 10 g 12   mometasone (NASONEX) 50 MCG/ACT nasal spray Place 2 sprays into the nose daily. 17 g 12   No current facility-administered medications on file prior to visit.    No Known Allergies  Social History:  reports that she has never smoked. She has never used smokeless tobacco. She reports current alcohol use of about 3.0 standard drinks of alcohol per week. She reports that she does not use drugs.  Family History  Problem Relation Age of Onset   Heart attack Mother    Heart attack Father    Diabetes Neg Hx    Hyperlipidemia Neg Hx    Hypertension Neg Hx    Sudden death Neg Hx    Colon cancer Maternal Grandmother     The following portions of the patient's history were reviewed and updated as appropriate: allergies, current medications, past family history, past medical history, past social history, past surgical history and problem list.  Review of Systems Pertinent items noted in HPI and remainder of comprehensive ROS otherwise negative.  Physical Exam:  BP 108/66   Pulse 67   Wt 240 lb 8 oz (109.1 kg)   BMI 43.99 kg/m  Physical Exam Vitals and nursing note reviewed. Exam conducted with a chaperone present.  Constitutional:      Appearance: Normal appearance.  Cardiovascular:     Rate and Rhythm: Normal rate.  Pulmonary:     Effort: Pulmonary effort is normal.     Breath sounds: Normal breath sounds.  Genitourinary:    Comments: Erythematous vulva and bilateral groin and perianal skin in figure of 8 formation  Raised, tender lesion in left perianal skin  Fissures on inner labia minora bilaterally  Neurological:     General: No focal deficit present.     Mental Status: She is alert and oriented to person, place, and time.  Psychiatric:        Mood and Affect: Mood normal.        Behavior: Behavior normal.         Thought Content: Thought content normal.        Judgment: Judgment normal.     Media Information  Document Information  Photos    11/03/2022 14:28  Attached To:  Office Visit on 11/03/22 with Lorriane Shire, MD  Source Information  Lorriane Shire, MD  [facility]  Document History     PUNCH BIOPSY NOTE The indications for skin biopsy (rule out neoplasia, establish lichen sclerosus diagnosis) were reviewed.   Risks of the biopsy including pain, bleeding, infection, inadequate specimen, scarring and need for additional procedures  were discussed. The patient stated understanding and agreed to undergo procedure today. Consent was signed,  time out performed.  Chaperone was present during entire procedure. The patient's perianal skin was prepped with Betadine. 1% lidocaine was injected into the lesion. A 3-mm punch biopsy was done, biopsy tissue was picked up with sterile forceps and sterile scissors were used to excise the lesion.  Small bleeding was noted and hemostasis was achieved using silver nitrate sticks.  The patient tolerated the procedure well. Post-procedure instructions  (pelvic rest for one week) were given to the patient. The patient is to call with heavy bleeding, fever greater than 100.4, foul smelling discharge or other concerns.     Assessment and Plan:   1. 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 Will trial topical corticosteroid, atarax at night to help with itching and sleep and aquaphor for barrier.  - clobetasol ointment (TEMOVATE) 0.05 %; Apply to affected area every night for 4 weeks, then every other day for 4 weeks and then twice a week for 4 weeks or until resolution.  Dispense: 30 g; Refill: 5 - hydrOXYzine (ATARAX) 25 MG tablet; Take 1 tablet (25 mg total) by mouth at bedtime as needed for itching.  Dispense: 30 tablet; Refill: 2 - mineral oil-hydrophilic petrolatum (AQUAPHOR) ointment;  Apply topically as needed for dry skin.  Dispense: 420 g; Refill: 0 - Surgical pathology( [facility]/ [facility])  2. Perianal lesion Abnormal lesion on perianal skin within area of suspected lichen sclerosus - will follow up surgical pathology.    Routine preventative health maintenance measures emphasized. Please refer to After Visit Summary for other counseling recommendations.   Follow-up: Return in about 6 weeks (around 12/15/2022) for GYN Follow Up.     Lorriane Shire, MD Obstetrician & Gynecologist, Faculty Practice [facility] for [employer], [facility]

## 2022-11-03 NOTE — Patient Instructions (Signed)
Clobetasol ointment for itching  Hydroxyzine to keep you from itching at night  Pathology should come back in a few days

## 2022-11-05 ENCOUNTER — Telehealth: Payer: Self-pay | Admitting: Obstetrics and Gynecology

## 2022-11-05 DIAGNOSIS — C44520 Squamous cell carcinoma of anal skin: Secondary | ICD-10-CM

## 2022-11-05 NOTE — Telephone Encounter (Signed)
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 Referral placed to colorectal surgery given proximity to anus.     FINAL MICROSCOPIC DIAGNOSIS:   A. PERIANAL, LEFT, BIOPSY:       At least squamous cell carcinoma in situ.       See comment.

## 2022-11-08 LAB — SURGICAL PATHOLOGY

## 2022-11-08 NOTE — Addendum Note (Signed)
Addended by: Harlon Ditty on: 11/08/2022 09:45 AM   Modules accepted: Orders

## 2022-11-18 ENCOUNTER — Telehealth: Payer: Self-pay

## 2022-11-18 NOTE — Telephone Encounter (Addendum)
Patient called to follow up on referral to Dr. Lendon Ka-- reviewed with front office who states they faxed this today after phone call from Dr. Maisie Fus requesting records. Called pt with update. She will call our office if she does not hear from them by the end of next week.

## 2022-12-16 ENCOUNTER — Other Ambulatory Visit: Payer: Self-pay

## 2022-12-16 ENCOUNTER — Ambulatory Visit: Payer: Medicare Other | Admitting: Obstetrics and Gynecology

## 2022-12-16 ENCOUNTER — Encounter: Payer: Self-pay | Admitting: Obstetrics and Gynecology

## 2022-12-16 VITALS — BP 124/77 | HR 68 | Wt 243.2 lb

## 2022-12-16 DIAGNOSIS — L292 Pruritus vulvae: Secondary | ICD-10-CM | POA: Diagnosis not present

## 2022-12-16 DIAGNOSIS — C44520 Squamous cell carcinoma of anal skin: Secondary | ICD-10-CM | POA: Diagnosis not present

## 2022-12-16 NOTE — Patient Instructions (Signed)
Use aquaphor during the day around clitoris Continue using the steroid cream on the vulva Use vaginal estrogen near clitoris

## 2022-12-16 NOTE — Progress Notes (Signed)
GYNECOLOGY VISIT  Patient name: Leslie [name] MRN [id number]  Date of birth: Aug 28, 1955 Chief Complaint:   Follow-up  History:  Leslie [name] is a 67 y.o. No obstetric history on file. being seen today for follow up of vulvar itching and biopsy follow up. Has upcoming appointment with Dr. Maisie Fus regarding SCC found on perianal biopsy.   Has notice the itching is not as severe as it was before but still bad. Tried hydroxyzine but it knocked her completely out. Continues to have severe pain near clitoral area with burning  sensation. Previously had tried some sort of cream that caused burning but willing to try again.   Past Medical History:  Diagnosis Date   Personal history of colonic polyps -adeanoma, sessile serrated polyp 06/12/2013   Thyroid disease     Past Surgical History:  Procedure Laterality Date   COLONOSCOPY W/ POLYPECTOMY     2 benign polyps removed   LAPAROSCOPIC GASTRIC BANDING WITH HIATAL HERNIA REPAIR  7 yrs ago   WISDOM TOOTH EXTRACTION      The following portions of the patient's history were reviewed and updated as appropriate: allergies, current medications, past family history, past medical history, past social history, past surgical history and problem list.    Review of Systems:  Pertinent items are noted in HPI. Comprehensive review of systems was otherwise negative.   Objective:  Physical Exam BP 124/77   Pulse 68   Wt 243 lb 3.2 oz (110.3 kg)   BMI 44.48 kg/m    Physical Exam Vitals and nursing note reviewed. Exam conducted with a chaperone present.  Constitutional:      Appearance: Normal appearance.  HENT:     Head: Normocephalic and atraumatic.  Pulmonary:     Effort: Pulmonary effort is normal.     Breath sounds: Normal breath sounds.  Genitourinary:    General: Normal vulva.     Exam position: Lithotomy position.     Vagina: Normal.     Cervix: Normal.     Comments: Healed perianal biopsy site Mild erythema throughout vulva  and perianal skin Ulceration on left inner labia/clitoral hood, tender to touch No  Skin:    General: Skin is warm and dry.  Neurological:     General: No focal deficit present.     Mental Status: She is alert.  Psychiatric:        Mood and Affect: Mood normal.        Behavior: Behavior normal.        Thought Content: Thought content normal.        Judgment: Judgment normal.         Assessment & Plan:   1. Vulvar pruritus Use of hydroxyzine at night or can try benadryl for sleep to help with night time itching. Continue topical clobetasol for vulvar itching. 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 Maisie Fus coming up   Routine preventative health maintenance measures emphasized.  Lorriane Shire, MD [facility]ally [facility]ologic [facility] for [facility]

## 2022-12-20 ENCOUNTER — Ambulatory Visit: Payer: Self-pay | Admitting: General Surgery

## 2022-12-20 NOTE — H&P (View-Only) (Signed)
REFERRING PHYSICIAN:  Vanessa Kick, MD  PROVIDER:  Elenora Gamma, MD  MRN: [id number] DOB: 05-13-1955 DATE OF ENCOUNTER: 12/20/2022  Subjective  Chief Complaint: New Consultation ( squamous cell carcinoma of perianal region)     History of Present Illness: Leslie [name] is a 67 y.o. female who is seen today as an office consultation at the request of Dr. Briscoe Deutscher for evaluation of New Consultation ( squamous cell carcinoma of perianal region) .  67 year old female who was seen by her gynecologist due to itching.  This was treated with topical treatments and her symptoms persisted.  She underwent a biopsy of a perianal lesion.  This showed at least squamous cell carcinoma in situ.   Review of Systems: A complete review of systems was obtained from the patient.  I have reviewed this information and discussed as appropriate with the patient.  See HPI as well for other ROS.   Medical History: History reviewed. No pertinent past medical history.  There is no problem list on file for this patient.   History reviewed. No pertinent surgical history.   No Known Allergies  Current Outpatient Medications on File Prior to Visit Medication Sig Dispense Refill  clobetasoL (TEMOVATE) 0.05 % ointment Apply to affected area every night for 4 weeks, then every other day for 4 weeks and then twice a week for 4 weeks or until resolution.    FLUoxetine (PROZAC) 40 MG capsule Take by mouth    hydrOXYzine (ATARAX) 25 MG tablet Take by mouth    levothyroxine (SYNTHROID) 137 MCG tablet TAKE 1 TABLET(137 MCG) BY MOUTH DAILY    triamcinolone 0.5 % ointment Apply topically 2 (two) times daily    No current facility-administered medications on file prior to visit.   Family History Problem Relation Age of Onset  Hyperlipidemia (Elevated cholesterol) Mother   Breast cancer Mother   Coronary Artery Disease (Blocked arteries around heart) Mother   Coronary Artery Disease (Blocked  arteries around heart) Father     Social History  Tobacco Use Smoking Status Former  Types: Cigarettes Smokeless Tobacco Never    Social History  Socioeconomic History  Marital status: Single Tobacco Use  Smoking status: Former   Types: Cigarettes  Smokeless tobacco: Never Vaping Use  Vaping status: Never Used Substance and Sexual Activity  Alcohol use: Yes  Drug use: Never  Social Determinants of Health  Financial Resource Strain: Low Risk  (05/18/2022)  Received from [facility]  Overall Financial Resource Strain (CARDIA)   Difficulty of Paying Living Expenses: Not hard at all Food Insecurity: No Food Insecurity (05/18/2022)  Received from [facility]  Hunger Vital Sign   Worried About Running Out of Food in the Last Year: Never true   Ran Out of Food in the Last Year: Never true Transportation Needs: No Transportation Needs (05/18/2022)  Received from [facility] - Transportation   Lack of Transportation (Medical): No   Lack of Transportation (Non-Medical): No Physical Activity: Insufficiently Active (05/18/2022)  Received from [facility]  Exercise Vital Sign   Days of Exercise per Week: 7 days   Minutes of Exercise per Session: 10 min Stress: No Stress Concern Present (05/18/2022)  Received from [facility] - Occupational Stress Questionnaire   Feeling of Stress : Not at all Social Connections: Moderately Integrated (05/18/2022)  Received from [facility]  Social Network   How would you rate your social network (family, work, friends)?: Adequate participation with social networks  Objective:   Vitals:  12/20/22 0925 BP: 129/75 Pulse: 79 Temp: 36.9 C (98.4 F) SpO2: 97% Weight: (!) 111.5 kg (245 lb 12.8 oz) Height: 157.5 cm (5\' 2" ) PainSc: 0-No pain    Exam Gen: NAD Abd: soft Rectal: L lateral raised perianal lesion   Labs, Imaging and Diagnostic Testing:  Procedure:  Anoscopy Surgeon: Maisie Fus After the risks and benefits were explained, written consent was obtained for above procedure.  A medical assistant chaperone was present thoroughout the entire procedure.  Anesthesia: none Diagnosis: Perianal lesion Findings: No other concerning internal lesions noted   Assessment and Plan: Diagnoses and all orders for this visit:  Anal lesion    67 year old female with a perianal lesion.  Biopsies have shown a anus squamous cell carcinoma in situ.  Anoscopy performed today shows no other lesions internally.  I have recommended excisional biopsy.  We will get this scheduled to soon as possible.  Vanita Panda, MD Colon and Rectal Surgery [facility]

## 2022-12-20 NOTE — H&P (Signed)
REFERRING PHYSICIAN:  Vanessa Kick, MD  PROVIDER:  Elenora Gamma, MD  MRN: [id number] DOB: 05-13-1955 DATE OF ENCOUNTER: 12/20/2022  Subjective  Chief Complaint: New Consultation ( squamous cell carcinoma of perianal region)     History of Present Illness: Leslie [name] is a 67 y.o. female who is seen today as an office consultation at the request of Dr. Briscoe Deutscher for evaluation of New Consultation ( squamous cell carcinoma of perianal region) .  67 year old female who was seen by her gynecologist due to itching.  This was treated with topical treatments and her symptoms persisted.  She underwent a biopsy of a perianal lesion.  This showed at least squamous cell carcinoma in situ.   Review of Systems: A complete review of systems was obtained from the patient.  I have reviewed this information and discussed as appropriate with the patient.  See HPI as well for other ROS.   Medical History: History reviewed. No pertinent past medical history.  There is no problem list on file for this patient.   History reviewed. No pertinent surgical history.   No Known Allergies  Current Outpatient Medications on File Prior to Visit Medication Sig Dispense Refill  clobetasoL (TEMOVATE) 0.05 % ointment Apply to affected area every night for 4 weeks, then every other day for 4 weeks and then twice a week for 4 weeks or until resolution.    FLUoxetine (PROZAC) 40 MG capsule Take by mouth    hydrOXYzine (ATARAX) 25 MG tablet Take by mouth    levothyroxine (SYNTHROID) 137 MCG tablet TAKE 1 TABLET(137 MCG) BY MOUTH DAILY    triamcinolone 0.5 % ointment Apply topically 2 (two) times daily    No current facility-administered medications on file prior to visit.   Family History Problem Relation Age of Onset  Hyperlipidemia (Elevated cholesterol) Mother   Breast cancer Mother   Coronary Artery Disease (Blocked arteries around heart) Mother   Coronary Artery Disease (Blocked  arteries around heart) Father     Social History  Tobacco Use Smoking Status Former  Types: Cigarettes Smokeless Tobacco Never    Social History  Socioeconomic History  Marital status: Single Tobacco Use  Smoking status: Former   Types: Cigarettes  Smokeless tobacco: Never Vaping Use  Vaping status: Never Used Substance and Sexual Activity  Alcohol use: Yes  Drug use: Never  Social Determinants of Health  Financial Resource Strain: Low Risk  (05/18/2022)  Received from [facility]  Overall Financial Resource Strain (CARDIA)   Difficulty of Paying Living Expenses: Not hard at all Food Insecurity: No Food Insecurity (05/18/2022)  Received from [facility]  Hunger Vital Sign   Worried About Running Out of Food in the Last Year: Never true   Ran Out of Food in the Last Year: Never true Transportation Needs: No Transportation Needs (05/18/2022)  Received from [facility] - Transportation   Lack of Transportation (Medical): No   Lack of Transportation (Non-Medical): No Physical Activity: Insufficiently Active (05/18/2022)  Received from [facility]  Exercise Vital Sign   Days of Exercise per Week: 7 days   Minutes of Exercise per Session: 10 min Stress: No Stress Concern Present (05/18/2022)  Received from [facility]ational [facility] - Occupational Stress Questionnaire   Feeling of Stress : Not at all Social Connections: Moderately Integrated (05/18/2022)  Received from [facility]  Social Network   How would you rate your social network (family, work, friends)?: Adequate participation with social networks  Objective:   Vitals:  12/20/22 0925 BP: 129/75 Pulse: 79 Temp: 36.9 C (98.4 F) SpO2: 97% Weight: (!) 111.5 kg (245 lb 12.8 oz) Height: 157.5 cm (5\' 2" ) PainSc: 0-No pain    Exam Gen: NAD Abd: soft Rectal: L lateral raised perianal lesion   Labs, Imaging and Diagnostic Testing:  Procedure:  Anoscopy Surgeon: Maisie Fus After the risks and benefits were explained, written consent was obtained for above procedure.  A medical assistant chaperone was present thoroughout the entire procedure.  Anesthesia: none Diagnosis: Perianal lesion Findings: No other concerning internal lesions noted   Assessment and Plan: Diagnoses and all orders for this visit:  Anal lesion    67 year old female with a perianal lesion.  Biopsies have shown a anus squamous cell carcinoma in situ.  Anoscopy performed today shows no other lesions internally.  I have recommended excisional biopsy.  We will get this scheduled to soon as possible.  Vanita Panda, MD Colon and Rectal Surgery [facility]

## 2022-12-21 ENCOUNTER — Encounter (HOSPITAL_BASED_OUTPATIENT_CLINIC_OR_DEPARTMENT_OTHER): Payer: Self-pay | Admitting: General Surgery

## 2022-12-21 NOTE — Progress Notes (Signed)
Spoke w/ via phone for pre-op interview--- Leslie [name] needs dos----  [employer]       [name] results------ COVID test -----patient states asymptomatic no test needed Arrive at -------0630 NPO after MN NO Solid Food.   Med rec completed Medications to take morning of surgery -----Synthroid and Prozac Diabetic medication ----- Patient instructed no nail polish to be worn day of surgery Patient instructed to bring photo id and insurance card day of surgery Patient aware to have Driver (ride ) / caregiver    for 24 hours after surgery - Leslie [name]- daughter Patient Special Instructions ----- Pre-Op special Instructions ----- Patient verbalized understanding of instructions that were given at this phone interview. Patient denies chest pain, sob, fever, cough at the interview.

## 2022-12-29 NOTE — Anesthesia Preprocedure Evaluation (Signed)
Anesthesia Evaluation  Patient identified by MRN, date of birth, ID band Patient awake    Reviewed: Allergy & Precautions, H&P , NPO status , Patient's Chart, lab work & pertinent test results  Airway Mallampati: III  TM Distance: >3 FB Neck ROM: Full    Dental no notable dental hx. (+) Teeth Intact, Dental Advisory Given   Pulmonary neg pulmonary ROS, former smoker   Pulmonary exam normal breath sounds clear to auscultation       Cardiovascular Exercise Tolerance: Good negative cardio ROS Normal cardiovascular exam Rhythm:Regular Rate:Normal     Neuro/Psych  PSYCHIATRIC DISORDERS Anxiety Depression    negative neurological ROS  negative psych ROS   GI/Hepatic negative GI ROS, Neg liver ROS,,,  Endo/Other  negative endocrine ROSHypothyroidism  Morbid obesity  Renal/GU negative Renal ROS  negative genitourinary   Musculoskeletal negative musculoskeletal ROS (+)    Abdominal  (+) + obese  Peds negative pediatric ROS (+)  Hematology negative hematology ROS (+)   Anesthesia Other Findings   Reproductive/Obstetrics negative OB ROS                             Anesthesia Physical Anesthesia Plan  ASA: 2  Anesthesia Plan: MAC   Post-op Pain Management: Minimal or no pain anticipated   Induction: Intravenous  PONV Risk Score and Plan: 2 and Ondansetron, Treatment may vary due to age or medical condition, Propofol infusion and Midazolam  Airway Management Planned: Mask, Natural Airway and Simple Face Mask  Additional Equipment: None  Intra-op Plan:   Post-operative Plan:   Informed Consent: I have reviewed the patients History and Physical, chart, labs and discussed the procedure including the risks, benefits and alternatives for the proposed anesthesia with the patient or authorized representative who has indicated his/her understanding and acceptance.       Plan Discussed with:  Anesthesiologist and CRNA  Anesthesia Plan Comments:        Anesthesia Quick Evaluation

## 2022-12-30 ENCOUNTER — Encounter (HOSPITAL_BASED_OUTPATIENT_CLINIC_OR_DEPARTMENT_OTHER): Payer: Self-pay | Admitting: General Surgery

## 2022-12-30 ENCOUNTER — Ambulatory Visit (HOSPITAL_BASED_OUTPATIENT_CLINIC_OR_DEPARTMENT_OTHER): Payer: Medicare Other | Admitting: Anesthesiology

## 2022-12-30 ENCOUNTER — Other Ambulatory Visit: Payer: Self-pay

## 2022-12-30 ENCOUNTER — Encounter (HOSPITAL_BASED_OUTPATIENT_CLINIC_OR_DEPARTMENT_OTHER)
Admission: RE | Disposition: A | Discharge status: Home / Self Care | Payer: Self-pay | Source: Ambulatory Visit | Attending: General Surgery

## 2022-12-30 ENCOUNTER — Ambulatory Visit (HOSPITAL_BASED_OUTPATIENT_CLINIC_OR_DEPARTMENT_OTHER)
Admission: RE | Admit: 2022-12-30 | Discharge: 2022-12-30 | Disposition: A | Discharge status: Home / Self Care | Payer: Medicare Other | Source: Ambulatory Visit | Attending: General Surgery | Admitting: General Surgery

## 2022-12-30 DIAGNOSIS — Z6841 Body Mass Index (BMI) 40.0 and over, adult: Secondary | ICD-10-CM | POA: Diagnosis not present

## 2022-12-30 DIAGNOSIS — F32A Depression, unspecified: Secondary | ICD-10-CM | POA: Diagnosis not present

## 2022-12-30 DIAGNOSIS — E039 Hypothyroidism, unspecified: Secondary | ICD-10-CM | POA: Diagnosis not present

## 2022-12-30 DIAGNOSIS — C44520 Squamous cell carcinoma of anal skin: Secondary | ICD-10-CM

## 2022-12-30 DIAGNOSIS — Z87891 Personal history of nicotine dependence: Secondary | ICD-10-CM | POA: Diagnosis not present

## 2022-12-30 DIAGNOSIS — F419 Anxiety disorder, unspecified: Secondary | ICD-10-CM | POA: Diagnosis not present

## 2022-12-30 HISTORY — DX: Anxiety disorder, unspecified: F41.9

## 2022-12-30 HISTORY — DX: Hypothyroidism, unspecified: E03.9

## 2022-12-30 HISTORY — DX: Depression, unspecified: F32.A

## 2022-12-30 HISTORY — PX: LESION REMOVAL: SHX5196

## 2022-12-30 SURGERY — WIDE EXCISION, LESION, UPPER EXTREMITY
Anesthesia: Monitor Anesthesia Care | Site: Perineum

## 2022-12-30 MED ORDER — DEXAMETHASONE SODIUM PHOSPHATE 10 MG/ML IJ SOLN
INTRAMUSCULAR | Status: AC
  Filled 2022-12-30: qty 1

## 2022-12-30 MED ORDER — ONDANSETRON HCL 4 MG/2ML IJ SOLN
INTRAMUSCULAR | Status: AC
  Filled 2022-12-30: qty 2

## 2022-12-30 MED ORDER — PROPOFOL 1000 MG/100ML IV EMUL
INTRAVENOUS | Status: AC
  Filled 2022-12-30: qty 100

## 2022-12-30 MED ORDER — OXYCODONE HCL 5 MG PO TABS: 5.0000 mg | ORAL_TABLET | Freq: Once | ORAL | Status: DC | PRN

## 2022-12-30 MED ORDER — 0.9 % SODIUM CHLORIDE (POUR BTL) OPTIME
TOPICAL | Status: DC | PRN
Start: 2022-12-30 — End: 2022-12-30
  Administered 2022-12-30: 500 mL

## 2022-12-30 MED ORDER — SODIUM CHLORIDE 0.9% FLUSH: 3.0000 mL | Freq: Two times a day (BID) | INTRAVENOUS | Status: DC

## 2022-12-30 MED ORDER — PROPOFOL 10 MG/ML IV BOLUS
INTRAVENOUS | Status: DC | PRN
  Administered 2022-12-30 (×3): 20 mg via INTRAVENOUS

## 2022-12-30 MED ORDER — ACETAMINOPHEN 500 MG PO TABS
ORAL_TABLET | ORAL | Status: AC
  Filled 2022-12-30: qty 2

## 2022-12-30 MED ORDER — TRAMADOL HCL 50 MG PO TABS
50.0000 mg | ORAL_TABLET | Freq: Four times a day (QID) | ORAL | 0 refills | Status: DC | PRN
Start: 2022-12-30 — End: 2023-04-13

## 2022-12-30 MED ORDER — PROPOFOL 500 MG/50ML IV EMUL
INTRAVENOUS | Status: DC | PRN
  Administered 2022-12-30: 75 ug/kg/min via INTRAVENOUS

## 2022-12-30 MED ORDER — ONDANSETRON HCL 4 MG/2ML IJ SOLN
INTRAMUSCULAR | Status: DC | PRN
  Administered 2022-12-30: 4 mg via INTRAVENOUS

## 2022-12-30 MED ORDER — KETOROLAC TROMETHAMINE 30 MG/ML IJ SOLN
INTRAMUSCULAR | Status: AC
  Filled 2022-12-30: qty 1

## 2022-12-30 MED ORDER — ONDANSETRON HCL 4 MG/2ML IJ SOLN: 4.0000 mg | Freq: Once | INTRAMUSCULAR | Status: DC | PRN

## 2022-12-30 MED ORDER — LIDOCAINE 5 % EX OINT: 1.0000 | TOPICAL_OINTMENT | CUTANEOUS | 0 refills | Status: DC | PRN

## 2022-12-30 MED ORDER — LACTATED RINGERS IV SOLN: INTRAVENOUS | Status: DC

## 2022-12-30 MED ORDER — FENTANYL CITRATE (PF) 100 MCG/2ML IJ SOLN: 25.0000 ug | INTRAMUSCULAR | Status: DC | PRN

## 2022-12-30 MED ORDER — FENTANYL CITRATE (PF) 100 MCG/2ML IJ SOLN
INTRAMUSCULAR | Status: AC
  Filled 2022-12-30: qty 2

## 2022-12-30 MED ORDER — OXYCODONE HCL 5 MG/5ML PO SOLN: 5.0000 mg | Freq: Once | ORAL | Status: DC | PRN

## 2022-12-30 MED ORDER — KETOROLAC TROMETHAMINE 30 MG/ML IJ SOLN
INTRAMUSCULAR | Status: DC | PRN
  Administered 2022-12-30: 15 mg via INTRAVENOUS

## 2022-12-30 MED ORDER — LIDOCAINE HCL (CARDIAC) PF 100 MG/5ML IV SOSY
PREFILLED_SYRINGE | INTRAVENOUS | Status: DC | PRN
  Administered 2022-12-30: 40 mg via INTRAVENOUS

## 2022-12-30 MED ORDER — MIDAZOLAM HCL 2 MG/2ML IJ SOLN
INTRAMUSCULAR | Status: AC
  Filled 2022-12-30: qty 2

## 2022-12-30 MED ORDER — DEXAMETHASONE SODIUM PHOSPHATE 4 MG/ML IJ SOLN
INTRAMUSCULAR | Status: DC | PRN
  Administered 2022-12-30: 5 mg via INTRAVENOUS

## 2022-12-30 MED ORDER — ACETAMINOPHEN 160 MG/5ML PO SOLN: 325.0000 mg | ORAL | Status: DC | PRN

## 2022-12-30 MED ORDER — MEPERIDINE HCL 25 MG/ML IJ SOLN: 6.2500 mg | INTRAMUSCULAR | Status: DC | PRN

## 2022-12-30 MED ORDER — ACETAMINOPHEN 500 MG PO TABS
1000.0000 mg | ORAL_TABLET | ORAL | Status: AC
  Administered 2022-12-30: 1000 mg via ORAL

## 2022-12-30 MED ORDER — BUPIVACAINE-EPINEPHRINE 0.5% -1:200000 IJ SOLN
INTRAMUSCULAR | Status: DC | PRN
  Administered 2022-12-30: 30 mL

## 2022-12-30 MED ORDER — MIDAZOLAM HCL 5 MG/5ML IJ SOLN
INTRAMUSCULAR | Status: DC | PRN
  Administered 2022-12-30: 2 mg via INTRAVENOUS

## 2022-12-30 MED ORDER — SODIUM CHLORIDE 0.9% FLUSH: 10.0000 mL | Freq: Two times a day (BID) | INTRAVENOUS | Status: DC

## 2022-12-30 MED ORDER — LIDOCAINE HCL (PF) 2 % IJ SOLN
INTRAMUSCULAR | Status: AC
  Filled 2022-12-30: qty 5

## 2022-12-30 MED ORDER — ACETAMINOPHEN 325 MG PO TABS: 325.0000 mg | ORAL_TABLET | ORAL | Status: DC | PRN

## 2022-12-30 MED ORDER — LACTATED RINGERS IV SOLN
INTRAVENOUS | Status: DC | PRN
Start: 2022-12-30 — End: 2022-12-30

## 2022-12-30 MED ORDER — FENTANYL CITRATE (PF) 100 MCG/2ML IJ SOLN
INTRAMUSCULAR | Status: DC | PRN
  Administered 2022-12-30 (×4): 25 ug via INTRAVENOUS

## 2022-12-30 SURGICAL SUPPLY — 46 items
ADH SKN CLS APL DERMABOND .7 (GAUZE/BANDAGES/DRESSINGS)
APL PRP STRL LF DISP 70% ISPRP (MISCELLANEOUS)
APL SKNCLS STERI-STRIP NONHPOA (GAUZE/BANDAGES/DRESSINGS) ×1
BENZOIN TINCTURE PRP APPL 2/3 (GAUZE/BANDAGES/DRESSINGS) IMPLANT
BLADE CLIPPER SENSICLIP SURGIC (BLADE) IMPLANT
BLADE SURG 15 STRL LF DISP TIS (BLADE) IMPLANT
BLADE SURG 15 STRL SS (BLADE) ×1
BRIEF MESH DISP LRG (UNDERPADS AND DIAPERS) ×2 IMPLANT
CHLORAPREP W/TINT 26 (MISCELLANEOUS) IMPLANT
COVER BACK TABLE 60X90IN (DRAPES) ×2 IMPLANT
COVER MAYO STAND STRL (DRAPES) ×2 IMPLANT
DERMABOND ADVANCED .7 DNX12 (GAUZE/BANDAGES/DRESSINGS) ×2 IMPLANT
DRAPE HYSTEROSCOPY (MISCELLANEOUS) IMPLANT
DRAPE LAPAROTOMY 100X72 PEDS (DRAPES) ×2 IMPLANT
DRAPE SHEET LG 3/4 BI-LAMINATE (DRAPES) IMPLANT
DRAPE UTILITY XL STRL (DRAPES) ×2 IMPLANT
DRSG TEGADERM 4X4.75 (GAUZE/BANDAGES/DRESSINGS) IMPLANT
ELECT REM PT RETURN 9FT ADLT (ELECTROSURGICAL) ×1
ELECTRODE REM PT RTRN 9FT ADLT (ELECTROSURGICAL) ×2 IMPLANT
GAUZE PAD ABD 8X10 STRL (GAUZE/BANDAGES/DRESSINGS) IMPLANT
GAUZE SPONGE 4X4 12PLY STRL (GAUZE/BANDAGES/DRESSINGS) IMPLANT
GLOVE BIO SURGEON STRL SZ 6.5 (GLOVE) ×2 IMPLANT
GLOVE INDICATOR 6.5 STRL GRN (GLOVE) ×2 IMPLANT
KIT SIGMOIDOSCOPE (SET/KITS/TRAYS/PACK) IMPLANT
KIT TURNOVER CYSTO (KITS) ×2 IMPLANT
LEGGING LITHOTOMY PAIR STRL (DRAPES) IMPLANT
NDL HYPO 22X1.5 SAFETY MO (MISCELLANEOUS) ×2 IMPLANT
NEEDLE HYPO 22X1.5 SAFETY MO (MISCELLANEOUS) ×1 IMPLANT
NS IRRIG 500ML POUR BTL (IV SOLUTION) IMPLANT
PAD ARMBOARD 7.5X6 YLW CONV (MISCELLANEOUS) IMPLANT
PENCIL SMOKE EVACUATOR (MISCELLANEOUS) ×2 IMPLANT
SLEEVE SCD COMPRESS KNEE MED (STOCKING) ×2 IMPLANT
SPIKE FLUID TRANSFER (MISCELLANEOUS) IMPLANT
STRIP CLOSURE SKIN 1/2X4 (GAUZE/BANDAGES/DRESSINGS) IMPLANT
SUT CHROMIC 3 0 SH 27 (SUTURE) IMPLANT
SUT VIC AB 2-0 SH 27 (SUTURE) ×1
SUT VIC AB 2-0 SH 27XBRD (SUTURE) IMPLANT
SUT VIC AB 3-0 SH 27 (SUTURE) ×1
SUT VIC AB 3-0 SH 27XBRD (SUTURE) IMPLANT
SYR CONTROL 10ML LL (SYRINGE) ×2 IMPLANT
TOWEL OR 17X24 6PK STRL BLUE (TOWEL DISPOSABLE) ×2 IMPLANT
TRAY DSU PREP LF (CUSTOM PROCEDURE TRAY) ×2 IMPLANT
TUBE CONNECTING 12X1/4 (SUCTIONS) IMPLANT
UNDERPAD 30X36 HEAVY ABSORB (UNDERPADS AND DIAPERS) IMPLANT
WATER STERILE IRR 1000ML POUR (IV SOLUTION) ×2 IMPLANT
YANKAUER SUCT BULB TIP NO VENT (SUCTIONS) IMPLANT

## 2022-12-30 NOTE — Anesthesia Postprocedure Evaluation (Signed)
Anesthesia Post Note  Patient: Leslie [name]  Procedure(s) Performed: EXCISION BIOPSY PERIANAL LESION (Perineum)     Patient location during evaluation: PACU Anesthesia Type: MAC Level of consciousness: awake and alert Pain management: pain level controlled Vital Signs Assessment: post-procedure vital signs reviewed and stable Respiratory status: spontaneous breathing, nonlabored ventilation, respiratory function stable and patient connected to nasal cannula oxygen Cardiovascular status: stable and blood pressure returned to baseline Postop Assessment: no apparent nausea or vomiting Anesthetic complications: no  No notable events documented.  Last Vitals:  Vitals:   12/30/22 0845 12/30/22 0910  BP: 134/68 136/81  Pulse: 62 (!) 56  Resp: 15 14  Temp: 36.5 C   SpO2: 95% 95%    Last Pain:  Vitals:   12/30/22 0910  PainSc: 0-No pain                 Jasher Barkan

## 2022-12-30 NOTE — Op Note (Signed)
12/30/2022  8:09 AM  PATIENT:  Leslie [name]  67 y.o. female  Patient Care Team: Ethelda Chick, MD as PCP - General (Family Medicine)  PRE-OPERATIVE DIAGNOSIS:  SQUAMOUS CELL CARCINOMA  POST-OPERATIVE DIAGNOSIS:  SQUAMOUS CELL CARCINOMA  PROCEDURE:  EXCISION BIOPSY PERIANAL LESION    Surgeon(s): Romie Levee, MD  ASSISTANT: none   ANESTHESIA:   local and MAC  SPECIMEN:  Source of Specimen:  L anterior perianal lesion  DISPOSITION OF SPECIMEN:  PATHOLOGY  COUNTS:  YES  PLAN OF CARE: Discharge to home after PACU  PATIENT DISPOSITION:  PACU - hemodynamically stable.  INDICATION: 67 y.o. F with perianal lesion, biopsy concerning for SCC   OR FINDINGS: Perianal lesion with no obvious deep invasion or sphincter involvement  DESCRIPTION: the patient was identified in the preoperative holding area and taken to the OR where they were laid on the operating room table.  MAC anesthesia was induced without difficulty. The patient was then positioned in prone jackknife position with buttocks gently taped apart.  The patient was then prepped and draped in usual sterile fashion.  SCDs were noted to be in place prior to the initiation of anesthesia. A surgical timeout was performed indicating the correct patient, procedure, positioning and need for preoperative antibiotics.  A rectal block was performed using Marcaine with epinephrine.    I marked 5 mm borders and excised around the lesion in an elliptical fashion.  I entered into the subcutaneous space and divided it deeply.  Hemostasis was achieved using electrocautery.  The edges of the sphincter complex could be noted at the medial border of the excision site.  The sphincter was left intact.  The specimen was marked with a stitch on the lateral border and sent to pathology for further examination.  I then closed the incision using interrupted 2-0 and 3-0 Vicryl sutures.  A dressing was applied.  Patient was awakened from anesthesia  and sent to the postanesthesia care unit in stable condition.  All counts were correct per operating room staff.  Vanita Panda, MD  Colorectal and General Surgery [facility]

## 2022-12-30 NOTE — Interval H&P Note (Signed)
History and Physical Interval Note:  12/30/2022 7:16 AM  Leslie [name]  has presented today for surgery, with the diagnosis of SQUAMOUS CELL CARCENOMA.  The various methods of treatment have been discussed with the patient and family. After consideration of risks, benefits and other options for treatment, the patient has consented to  Procedure(s): EXCISION BIOPSY PERIANAL LESION (N/A) as a surgical intervention.  The patient's history has been reviewed, patient examined, no change in status, stable for surgery.  I have reviewed the patient's chart and labs.  Questions were answered to the patient's satisfaction.     Vanita Panda, MD  Colorectal and General Surgery [facility]

## 2022-12-30 NOTE — Discharge Instructions (Addendum)
ANORECTAL SURGERY: POST OP INSTRUCTIONS Take your usually prescribed home medications unless otherwise directed. DIET: During the first few hours after surgery sip on some liquids until you are able to urinate.  It is normal to not urinate for several hours after this surgery.  If you feel uncomfortable, please contact the office for instructions.  After you are able to urinate,you may eat, if you feel like it.  Follow a light bland diet the first 24 hours after arrival home, such as soup, liquids, crackers, etc.  Be sure to include lots of fluids daily (6-8 glasses).  Avoid fast food or heavy meals, as your are more likely to get nauseated.  Eat a low fat diet the next few days after surgery.  Limit caffeine intake to 1-2 servings a day. PAIN CONTROL: Pain is best controlled by a usual combination of several different methods TOGETHER: Muscle relaxation Soak in a warm bath (or Sitz bath) three times a day and after bowel movements.  Continue to do this until all pain is resolved. Over the counter pain medication Prescription pain medication Most patients will experience some swelling and discomfort in the anus/rectal area and incisions.  Heat such as warm towels, sitz baths, warm baths, etc to help relax tight/sore spots and speed recovery.  Some people prefer to use ice, especially in the first couple days after surgery, as it may decrease the pain and swelling, or alternate between ice & heat.  Experiment to what works for you.  Swelling and bruising can take several weeks to resolve.  Pain can take even longer to completely resolve. It is helpful to take an over-the-counter pain medication regularly for the first few weeks.  Choose one of the following that works best for you: Naproxen (Aleve, etc)  Two 220mg  tabs twice a day Ibuprofen (Advil, etc) Three 200mg  tabs four times a day (every meal & bedtime) A  prescription for pain medication (such as percocet, oxycodone, hydrocodone, etc) should be  given to you upon discharge.  Take your pain medication as prescribed.  If you are having problems/concerns with the prescription medicine (does not control pain, nausea, vomiting, rash, itching, etc), please call us [phone] to see if we need to switch you to a different pain medicine that will work better for you and/or control your side effect better. If you need a refill on your pain medication, please contact your pharmacy.  They will contact our office to request authorization. Prescriptions will not be filled after 5 pm or on week-ends. KEEP YOUR BOWELS REGULAR and AVOID CONSTIPATION The goal is one to two soft bowel movements a day.  You should at least have a bowel movement every other day. Avoid getting constipated.  Between the surgery and the pain medications, it is common to experience some constipation. This can be very painful after rectal surgery.  Increasing fluid intake and taking a fiber supplement (such as Metamucil, Citrucel, FiberCon, etc) 1-2 times a day regularly will usually help prevent this problem from occurring.  A stool softener like colace is also recommended.  This can be purchased over the counter at your pharmacy.  You can take it up to 3 times a day.  If you do not have a bowel movement after 24 hrs since your surgery, take one does of milk of magnesia.  If you still haven't had a bowel movement 8-12 hours after that dose, take another dose.  If you don't have a bowel movement 48 hrs after surgery,  purchase a Fleets enema from the drug store and administer gently per package instructions.  If you still are having trouble with your bowel movements after that, please call the office for further instructions. If you develop diarrhea or have many loose bowel movements, simplify your diet to bland foods & liquids for a few days.  Stop any stool softeners and decrease your fiber supplement.  Switching to mild anti-diarrheal medications (Kayopectate, Pepto Bismol) can help.   If this worsens or does not improve, please call us.  Wound Care Remove your bandages before your first bowel movement or 8 hours after surgery.     Remove any wound packing material at this tim,e as well.  You do not need to repack the wound unless instructed otherwise.  Wear an absorbent pad or soft cotton gauze in your underwear to catch any drainage and help keep the area clean. You should change this every 2-3 hours while awake. Keep the area clean and dry.  Bathe / shower every day, especially after bowel movements.  Keep the area clean by showering / bathing over the incision / wound.   It is okay to soak an open wound to help wash it.  Wet wipes or showers / gentle washing after bowel movements is often less traumatic than regular toilet paper. You may have some styrofoam-like soft packing in the rectum which will come out with the first bowel movement.  You will often notice bleeding with bowel movements.  This should slow down by the end of the first week of surgery Expect some drainage.  This should slow down, too, by the end of the first week of surgery.  Wear an absorbent pad or soft cotton gauze in your underwear until the drainage stops. Do Not sit on a rubber or pillow ring.  This can make you symptoms worse.  You may sit on a soft pillow if needed.  ACTIVITIES as tolerated:   You may resume regular (light) daily activities beginning the next day--such as daily self-care, walking, climbing stairs--gradually increasing activities as tolerated.  If you can walk 30 minutes without difficulty, it is safe to try more intense activity such as jogging, treadmill, bicycling, low-impact aerobics, swimming, etc. Save the most intensive and strenuous activity for last such as sit-ups, heavy lifting, contact sports, etc  Refrain from any heavy lifting or straining until you are off narcotics for pain control.   You may drive when you are no longer taking prescription pain medication, you can  comfortably sit for long periods of time, and you can safely maneuver your car and apply brakes. You may have sexual intercourse when it is comfortable.  FOLLOW UP in our office Please call [facility] at [phone] to set up an appointment to see your surgeon in the office for a follow-up appointment approximately 3-4 weeks after your surgery. Make sure that you call for this appointment the day you arrive home to insure a convenient appointment time. 10. IF YOU HAVE DISABILITY OR FAMILY LEAVE FORMS, BRING THEM TO THE OFFICE FOR PROCESSING.  DO NOT GIVE THEM TO YOUR DOCTOR.     WHEN TO CALL us [phone]: Poor pain control Reactions / problems with new medications (rash/itching, nausea, etc)  Fever over 101.5 F (38.5 C) Inability to urinate Nausea and/or vomiting Worsening swelling or bruising Continued bleeding from incision. Increased pain, redness, or drainage from the incision  The clinic staff is available to answer your questions during regular business hours (8:30am-5pm).  Please don't hesitate to call and ask to speak to one of our nurses for clinical concerns.   A surgeon from [facility] is always on call at the hospitals   If you have a medical emergency, go to the nearest emergency room or call 911.    [facility] [address], [place], [place]  [address] ? MAIN: [phone] ? TOLL FREE: [phone] ? FAX [phone] [email or web address]          No acetaminophen/Tylenol until after 12:00 pm today if needed.  No ibuprofen, Advil, Aleve, Motrin, ketorolac, meloxicam, naproxen, or other NSAIDS until after 2:00 pm today if needed.     Post Anesthesia Home Care Instructions  Activity: Get plenty of rest for the remainder of the day. A responsible individual must stay with you for 24 hours following the procedure.  For the next 24 hours, DO NOT: -Drive a car -Advertising copywriter -Drink  alcoholic beverages -Take any medication unless instructed by your physician -Make any legal decisions or sign important papers.  Meals: Start with liquid foods such as gelatin or soup. Progress to regular foods as tolerated. Avoid greasy, spicy, heavy foods. If nausea and/or vomiting occur, drink only clear liquids until the nausea and/or vomiting subsides. Call your physician if vomiting continues.  Special Instructions/Symptoms: Your throat may feel dry or sore from the anesthesia or the breathing tube placed in your throat during surgery. If this causes discomfort, gargle with warm salt water. The discomfort should disappear within 24 hours.

## 2022-12-30 NOTE — Transfer of Care (Signed)
Immediate Anesthesia Transfer of Care Note  Patient: Leslie [name]  Procedure(s) Performed: Procedure(s) (LRB): EXCISION BIOPSY PERIANAL LESION (N/A)  Patient Location: PACU  Anesthesia Type: MAC  Level of Consciousness: awake, sedated, patient cooperative and responds to stimulation  Airway & Oxygen Therapy: Patient Spontanous Breathing and Patient connected to  oxygen  Post-op Assessment: Report given to PACU RN, Post -op Vital signs reviewed and stable and Patient moving all extremities  Post vital signs: Reviewed and stable  Complications: No apparent anesthesia complications

## 2022-12-30 NOTE — Anesthesia Procedure Notes (Signed)
Procedure Name: MAC Date/Time: 12/30/2022 7:40 AM  Performed by: Jessica Priest, CRNAPre-anesthesia Checklist: Timeout performed, Patient being monitored, Suction available, Emergency Drugs available and Patient identified Patient Re-evaluated:Patient Re-evaluated prior to induction Oxygen Delivery Method: Simple face mask Preoxygenation: Pre-oxygenation with 100% oxygen Induction Type: IV induction Placement Confirmation: breath sounds checked- equal and bilateral, CO2 detector and positive ETCO2

## 2022-12-31 ENCOUNTER — Encounter (HOSPITAL_BASED_OUTPATIENT_CLINIC_OR_DEPARTMENT_OTHER): Payer: Self-pay | Admitting: General Surgery

## 2022-12-31 LAB — SURGICAL PATHOLOGY

## 2023-01-04 ENCOUNTER — Other Ambulatory Visit: Payer: Self-pay | Admitting: General Surgery

## 2023-01-04 DIAGNOSIS — K629 Disease of anus and rectum, unspecified: Secondary | ICD-10-CM

## 2023-01-04 DIAGNOSIS — C210 Malignant neoplasm of anus, unspecified: Secondary | ICD-10-CM

## 2023-01-20 ENCOUNTER — Ambulatory Visit
Admission: RE | Admit: 2023-01-20 | Discharge: 2023-01-20 | Disposition: A | Discharge status: Home / Self Care | Payer: Medicare Other | Source: Ambulatory Visit | Attending: General Surgery | Admitting: General Surgery

## 2023-01-20 DIAGNOSIS — K629 Disease of anus and rectum, unspecified: Secondary | ICD-10-CM

## 2023-01-20 DIAGNOSIS — C210 Malignant neoplasm of anus, unspecified: Secondary | ICD-10-CM

## 2023-01-20 MED ORDER — IOPAMIDOL (ISOVUE-300) INJECTION 61%
500.0000 mL | Freq: Once | INTRAVENOUS | Status: AC | PRN
Start: 2023-01-20 — End: 2023-01-20
  Administered 2023-01-20: 100 mL via INTRAVENOUS

## 2023-02-15 ENCOUNTER — Ambulatory Visit: Payer: Medicare Other | Admitting: Obstetrics and Gynecology

## 2023-03-08 ENCOUNTER — Ambulatory Visit: Payer: Medicare Other | Admitting: Obstetrics and Gynecology

## 2023-03-08 ENCOUNTER — Other Ambulatory Visit: Payer: Self-pay

## 2023-03-08 ENCOUNTER — Encounter: Payer: Self-pay | Admitting: Obstetrics and Gynecology

## 2023-03-08 VITALS — BP 116/66 | HR 68 | Wt 246.1 lb

## 2023-03-08 DIAGNOSIS — L292 Pruritus vulvae: Secondary | ICD-10-CM

## 2023-03-08 NOTE — Patient Instructions (Signed)
Twice a day for 1 week and nightly for the next 7-10 weeks

## 2023-03-08 NOTE — Progress Notes (Signed)
GYNECOLOGY VISIT  Patient name: Leslie [name] MRN [id number]  Date of birth: 1955-08-04 Chief Complaint:   Follow-up   History:  Leslie [name] is a 66 y.o. No obstetric history on file. being seen today for follow up of vulvar itching.    A little better but still burning despite using clobetasol, aquaphor, and   Wonderin gi fher clitoris can be removed as most of hte itching an pain are there. Has had bleeding with attempting to wipe or even when water from bidet hits the area  Got a bidet  and that seems to help Taking the pill at night (half pill) and that helps Down to half a pill and sill having pain and far  Using vaginal estrogen at night And aquaphor during the day seems to be helpful  Hurts when water touches the area and the area feels more firm than it had previously States that Dr. 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   Thyroid disease     Past Surgical History:  Procedure Laterality Date   COLONOSCOPY W/ POLYPECTOMY     2 benign polyps removed   LAPAROSCOPIC GASTRIC BANDING WITH HIATAL HERNIA REPAIR  7 yrs ago   LESION REMOVAL N/A 12/30/2022   Procedure: EXCISION BIOPSY PERIANAL LESION;  Surgeon: Romie Levee, MD;  Location: [facility];  Service: General;  Laterality: N/A;   WISDOM TOOTH EXTRACTION      The following portions of the patient's history were reviewed and updated as appropriate: allergies, current medications, past family history, past medical history, past social history, past surgical history and problem list.   Health Maintenance:   Last pap 11/2018 NILM Last mammogram: 10/2021 BIRADS 1   Review of Systems:  Pertinent items are noted in HPI. Comprehensive review of systems was otherwise negative.    Objective:  Physical Exam BP 116/66 (BP Location: Left Wrist)   Pulse 68   Wt 246 lb 1.6 oz (111.6 kg)   BMI 45.01 kg/m    Physical Exam Vitals and nursing note reviewed. Exam conducted with a chaperone present.  Constitutional:      Appearance: Normal appearance.  HENT:     Head: Normocephalic and atraumatic.  Pulmonary:     Effort: Pulmonary effort is normal.     Breath sounds: Normal breath sounds.  Genitourinary:    General: Normal vulva.     Exam position: Lithotomy position.     Vagina: Normal.     Cervix: Normal.     Comments: Erythematous and thickened skin involving clitoral hood and and right inner labia minora, tender to palpation, narrowed vaginal introitsu Skin:    General: Skin is warm and dry.  Neurological:     General: No focal deficit present.     Mental Status: She is alert.  Psychiatric:        Mood and Affect: Mood normal.        Behavior: Behavior normal.        Thought Content: Thought content normal.        Judgment: Judgment normal.       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 Very tender one exam, recommend biopsy. Concern for possible SCC  as found in anal region. Plan for EUA, vulvar biopsy in OR to assess if worsening LS vs other etiology. Continue bidet and vulvar care recommendations.  - Ambulatory Referral For Surgery Scheduling   Routine preventative health maintenance measures emphasized.  Lorriane Shire, MD Minimally Invasive Gynecologic Surgery [facility] for [facility], [facility]

## 2023-03-28 ENCOUNTER — Telehealth: Payer: Self-pay

## 2023-03-28 NOTE — Telephone Encounter (Signed)
 VM left by patient stating she is waiting for phone call to schedule biopsy in OR with Dr. Briscoe Deutscher. Pt was hoping to have this completed while her daughter is in town over Christmas break.

## 2023-03-31 ENCOUNTER — Telehealth: Payer: Self-pay

## 2023-03-31 NOTE — Telephone Encounter (Signed)
 Called patient to schedule surgery w/ Dr. Jeralyn. Patient agreed to have surgery on 04/13/23 @[facility]  at 1:45 pm. Patient is aware she must arrive at 11:45 am. Pre-op instructions and surgery details were provided over the phone. Written details will be sent to patients Mychart.

## 2023-04-03 ENCOUNTER — Other Ambulatory Visit: Payer: Self-pay | Admitting: Obstetrics and Gynecology

## 2023-04-03 DIAGNOSIS — L292 Pruritus vulvae: Secondary | ICD-10-CM

## 2023-04-11 ENCOUNTER — Encounter (HOSPITAL_BASED_OUTPATIENT_CLINIC_OR_DEPARTMENT_OTHER): Payer: Self-pay | Admitting: Obstetrics and Gynecology

## 2023-04-12 ENCOUNTER — Encounter (HOSPITAL_BASED_OUTPATIENT_CLINIC_OR_DEPARTMENT_OTHER): Payer: Self-pay | Admitting: Obstetrics and Gynecology

## 2023-04-12 NOTE — Progress Notes (Signed)
Spoke w/ via phone for pre-op interview--- pt Lab needs dos---- no        Lab results------ no COVID test -----patient states asymptomatic no test needed Arrive at ------- 0530 on 04-13-2023 NPO after MN w/ exception sips water w/ meds Med rec completed Medications to take morning of surgery ----- synthroid Diabetic medication ----- n/a Patient instructed no nail polish to be worn day of surgery Patient instructed to bring photo id and insurance card day of surgery Patient aware to have Driver (ride ) / caregiver    for 24 hours after surgery - daughter, penny Patient Special Instructions ----- n/a Pre-Op special Instructions ----- n/a Patient verbalized understanding of instructions that were given at this phone interview. Patient denies chest pain, sob, fever, cough at the interview.

## 2023-04-13 ENCOUNTER — Ambulatory Visit (HOSPITAL_BASED_OUTPATIENT_CLINIC_OR_DEPARTMENT_OTHER)
Admission: RE | Admit: 2023-04-13 | Discharge: 2023-04-13 | Disposition: A | Discharge status: Home / Self Care | Payer: Medicare Other | Source: Ambulatory Visit | Attending: Obstetrics and Gynecology | Admitting: Obstetrics and Gynecology

## 2023-04-13 ENCOUNTER — Other Ambulatory Visit (HOSPITAL_COMMUNITY): Admission: RE | Admit: 2023-04-13 | Payer: Medicare Other | Source: Ambulatory Visit

## 2023-04-13 ENCOUNTER — Encounter (HOSPITAL_BASED_OUTPATIENT_CLINIC_OR_DEPARTMENT_OTHER)
Admission: RE | Disposition: A | Discharge status: Home / Self Care | Payer: Self-pay | Source: Ambulatory Visit | Attending: Obstetrics and Gynecology

## 2023-04-13 ENCOUNTER — Encounter (HOSPITAL_BASED_OUTPATIENT_CLINIC_OR_DEPARTMENT_OTHER): Payer: Self-pay | Admitting: Obstetrics and Gynecology

## 2023-04-13 ENCOUNTER — Other Ambulatory Visit: Payer: Self-pay

## 2023-04-13 ENCOUNTER — Ambulatory Visit (HOSPITAL_BASED_OUTPATIENT_CLINIC_OR_DEPARTMENT_OTHER): Payer: Medicare Other | Admitting: Anesthesiology

## 2023-04-13 DIAGNOSIS — Z79890 Hormone replacement therapy (postmenopausal): Secondary | ICD-10-CM | POA: Diagnosis not present

## 2023-04-13 DIAGNOSIS — F419 Anxiety disorder, unspecified: Secondary | ICD-10-CM | POA: Diagnosis not present

## 2023-04-13 DIAGNOSIS — Z79899 Other long term (current) drug therapy: Secondary | ICD-10-CM | POA: Diagnosis not present

## 2023-04-13 DIAGNOSIS — E66813 Obesity, class 3: Secondary | ICD-10-CM | POA: Diagnosis not present

## 2023-04-13 DIAGNOSIS — E039 Hypothyroidism, unspecified: Secondary | ICD-10-CM

## 2023-04-13 DIAGNOSIS — Z800 Family history of malignant neoplasm of digestive organs: Secondary | ICD-10-CM | POA: Insufficient documentation

## 2023-04-13 DIAGNOSIS — F418 Other specified anxiety disorders: Secondary | ICD-10-CM

## 2023-04-13 DIAGNOSIS — C519 Malignant neoplasm of vulva, unspecified: Secondary | ICD-10-CM | POA: Diagnosis not present

## 2023-04-13 DIAGNOSIS — Z01818 Encounter for other preprocedural examination: Secondary | ICD-10-CM

## 2023-04-13 DIAGNOSIS — Z87891 Personal history of nicotine dependence: Secondary | ICD-10-CM | POA: Insufficient documentation

## 2023-04-13 DIAGNOSIS — F32A Depression, unspecified: Secondary | ICD-10-CM | POA: Diagnosis not present

## 2023-04-13 DIAGNOSIS — L292 Pruritus vulvae: Secondary | ICD-10-CM

## 2023-04-13 DIAGNOSIS — Z6841 Body Mass Index (BMI) 40.0 and over, adult: Secondary | ICD-10-CM | POA: Insufficient documentation

## 2023-04-13 DIAGNOSIS — Z9884 Bariatric surgery status: Secondary | ICD-10-CM | POA: Insufficient documentation

## 2023-04-13 HISTORY — DX: Iron deficiency anemia, unspecified: D50.9

## 2023-04-13 HISTORY — PX: VULVA /PERINEUM BIOPSY: SHX319

## 2023-04-13 HISTORY — DX: Malignant neoplasm of anus, unspecified: C21.0

## 2023-04-13 SURGERY — BIOPSY, VULVA
Anesthesia: General

## 2023-04-13 MED ORDER — ACETAMINOPHEN 500 MG PO TABS
ORAL_TABLET | ORAL | Status: AC
Start: 2023-04-13 — End: ?
  Filled 2023-04-13: qty 2

## 2023-04-13 MED ORDER — EPHEDRINE 5 MG/ML INJ
INTRAVENOUS | Status: AC
  Filled 2023-04-13: qty 5

## 2023-04-13 MED ORDER — FENTANYL CITRATE (PF) 100 MCG/2ML IJ SOLN
INTRAMUSCULAR | Status: AC
  Filled 2023-04-13: qty 2

## 2023-04-13 MED ORDER — FENTANYL CITRATE (PF) 100 MCG/2ML IJ SOLN
INTRAMUSCULAR | Status: DC | PRN
  Administered 2023-04-13 (×2): 50 ug via INTRAVENOUS
  Administered 2023-04-13: 25 ug via INTRAVENOUS
  Administered 2023-04-13: 50 ug via INTRAVENOUS
  Administered 2023-04-13: 25 ug via INTRAVENOUS

## 2023-04-13 MED ORDER — BUPIVACAINE HCL 0.5 % IJ SOLN
INTRAMUSCULAR | Status: DC | PRN
  Administered 2023-04-13: 10 mL

## 2023-04-13 MED ORDER — OXYCODONE HCL 5 MG PO TABS: 5.0000 mg | ORAL_TABLET | Freq: Once | ORAL | Status: DC | PRN

## 2023-04-13 MED ORDER — LACTATED RINGERS IV SOLN: INTRAVENOUS | Status: DC

## 2023-04-13 MED ORDER — PROPOFOL 10 MG/ML IV BOLUS
INTRAVENOUS | Status: DC | PRN
  Administered 2023-04-13: 50 mg via INTRAVENOUS
  Administered 2023-04-13: 180 mg via INTRAVENOUS
  Administered 2023-04-13: 50 mg via INTRAVENOUS
  Administered 2023-04-13: 20 mg via INTRAVENOUS

## 2023-04-13 MED ORDER — PROPOFOL 10 MG/ML IV BOLUS
INTRAVENOUS | Status: AC
  Filled 2023-04-13: qty 20

## 2023-04-13 MED ORDER — LIDOCAINE 2% (20 MG/ML) 5 ML SYRINGE
INTRAMUSCULAR | Status: DC | PRN
  Administered 2023-04-13: 100 mg via INTRAVENOUS

## 2023-04-13 MED ORDER — TRAMADOL HCL 50 MG PO TABS: 50.0000 mg | ORAL_TABLET | Freq: Four times a day (QID) | ORAL | 0 refills | Status: DC | PRN

## 2023-04-13 MED ORDER — MIDAZOLAM HCL 5 MG/5ML IJ SOLN
INTRAMUSCULAR | Status: DC | PRN
  Administered 2023-04-13: 2 mg via INTRAVENOUS

## 2023-04-13 MED ORDER — FENTANYL CITRATE (PF) 100 MCG/2ML IJ SOLN: 25.0000 ug | INTRAMUSCULAR | Status: DC | PRN

## 2023-04-13 MED ORDER — ONDANSETRON HCL 4 MG/2ML IJ SOLN
INTRAMUSCULAR | Status: DC | PRN
  Administered 2023-04-13: 4 mg via INTRAVENOUS

## 2023-04-13 MED ORDER — MIDAZOLAM HCL 2 MG/2ML IJ SOLN
INTRAMUSCULAR | Status: AC
Start: 2023-04-13 — End: ?
  Filled 2023-04-13: qty 2

## 2023-04-13 MED ORDER — ACETAMINOPHEN 500 MG PO TABS
1000.0000 mg | ORAL_TABLET | ORAL | Status: AC
  Administered 2023-04-13: 1000 mg via ORAL

## 2023-04-13 MED ORDER — DROPERIDOL 2.5 MG/ML IJ SOLN: 0.6250 mg | Freq: Once | INTRAMUSCULAR | Status: DC | PRN

## 2023-04-13 MED ORDER — ONDANSETRON HCL 4 MG/2ML IJ SOLN
INTRAMUSCULAR | Status: AC
  Filled 2023-04-13: qty 2

## 2023-04-13 MED ORDER — EPHEDRINE SULFATE (PRESSORS) 50 MG/ML IJ SOLN
INTRAMUSCULAR | Status: DC | PRN
  Administered 2023-04-13: 10 mg via INTRAVENOUS

## 2023-04-13 MED ORDER — KETOROLAC TROMETHAMINE 30 MG/ML IJ SOLN
INTRAMUSCULAR | Status: AC
  Filled 2023-04-13: qty 1

## 2023-04-13 MED ORDER — OXYCODONE HCL 5 MG/5ML PO SOLN: 5.0000 mg | Freq: Once | ORAL | Status: DC | PRN

## 2023-04-13 MED ORDER — KETOROLAC TROMETHAMINE 15 MG/ML IJ SOLN
INTRAMUSCULAR | Status: DC | PRN
  Administered 2023-04-13: 15 mg via INTRAVENOUS

## 2023-04-13 MED ORDER — DEXAMETHASONE SODIUM PHOSPHATE 4 MG/ML IJ SOLN
INTRAMUSCULAR | Status: DC | PRN
  Administered 2023-04-13: 5 mg via INTRAVENOUS

## 2023-04-13 MED ORDER — DEXAMETHASONE SODIUM PHOSPHATE 10 MG/ML IJ SOLN
INTRAMUSCULAR | Status: AC
  Filled 2023-04-13: qty 1

## 2023-04-13 MED ORDER — LIDOCAINE HCL (PF) 2 % IJ SOLN
INTRAMUSCULAR | Status: AC
  Filled 2023-04-13: qty 5

## 2023-04-13 SURGICAL SUPPLY — 28 items
BLADE CLIPPER SENSICLIP SURGIC (BLADE) IMPLANT
BLADE SURG 15 STRL LF DISP TIS (BLADE) ×2 IMPLANT
ELECT NDL TIP 2.8 STRL (NEEDLE) IMPLANT
ELECT NEEDLE TIP 2.8 STRL (NEEDLE) IMPLANT
GAUZE 4X4 16PLY ~~LOC~~+RFID DBL (SPONGE) ×4 IMPLANT
GLOVE BIOGEL PI IND STRL 7.0 (GLOVE) ×2 IMPLANT
GLOVE ECLIPSE 6.5 STRL STRAW (GLOVE) ×4 IMPLANT
GOWN STRL REUS W/TWL LRG LVL3 (GOWN DISPOSABLE) ×2 IMPLANT
KIT TURNOVER CYSTO (KITS) ×2 IMPLANT
NDL HYPO 25X1 1.5 SAFETY (NEEDLE) IMPLANT
NEEDLE HYPO 25X1 1.5 SAFETY (NEEDLE) IMPLANT
NS IRRIG 500ML POUR BTL (IV SOLUTION) IMPLANT
PACK VAGINAL WOMENS (CUSTOM PROCEDURE TRAY) ×2 IMPLANT
PAD OB MATERNITY 4.3X12.25 (PERSONAL CARE ITEMS) ×2 IMPLANT
PUNCH BIOPSY 3 (MISCELLANEOUS) IMPLANT
SLEEVE SCD COMPRESS KNEE MED (STOCKING) ×2 IMPLANT
SUT MON AB 5-0 P3 18 (SUTURE) IMPLANT
SUT VIC AB 0 CT2 27 (SUTURE) IMPLANT
SUT VIC AB 2-0 SH 27XBRD (SUTURE) IMPLANT
SUT VIC AB 3-0 PS2 18XBRD (SUTURE) IMPLANT
SUT VIC AB 3-0 SH 27X BRD (SUTURE) IMPLANT
SUT VIC AB 4-0 SH 27XANBCTRL (SUTURE) IMPLANT
SUT VIC AB 5-0 PS2 18 (SUTURE) IMPLANT
SUT VICRYL 6 0 UNDY PS 6 (SUTURE) IMPLANT
SWAB COLLECTION DEVICE MRSA (MISCELLANEOUS) IMPLANT
SWAB CULTURE ESWAB REG 1ML (MISCELLANEOUS) ×2 IMPLANT
TOWEL OR 17X24 6PK STRL BLUE (TOWEL DISPOSABLE) ×4 IMPLANT
WATER STERILE IRR 500ML POUR (IV SOLUTION) ×2 IMPLANT

## 2023-04-13 NOTE — Anesthesia Postprocedure Evaluation (Signed)
Anesthesia Post Note  Patient: Leslie [name]  Procedure(s) Performed: VULVAR BIOPSY WITH PAPSMERE     Patient location during evaluation: PACU Anesthesia Type: General Level of consciousness: awake and alert Pain management: pain level controlled Vital Signs Assessment: post-procedure vital signs reviewed and stable Respiratory status: spontaneous breathing, nonlabored ventilation and respiratory function stable Cardiovascular status: blood pressure returned to baseline Postop Assessment: no apparent nausea or vomiting Anesthetic complications: no   No notable events documented.  Last Vitals:  Vitals:   04/13/23 0835 04/13/23 0845  BP:  134/71  Pulse: 70 63  Resp: 18 (!) 9  Temp:    SpO2: 95% 97%    Last Pain:  Vitals:   04/13/23 0900  TempSrc:   PainSc: 0-No pain                 Shanda Howells

## 2023-04-13 NOTE — Transfer of Care (Signed)
Immediate Anesthesia Transfer of Care Note  Patient: Leslie [name]  Procedure(s) Performed: Procedure(s) (LRB): VULVAR BIOPSY WITH PAPSMERE (N/A)  Patient Location: PACU  Anesthesia Type: GA  Level of Consciousness: awake, sedated, patient cooperative and responds to stimulation, c/o pain in back - comfort measures given w/ medication   Airway & Oxygen Therapy: Patient Spontanous Breathing and Patient connected to [place] oxygen  Post-op Assessment: Report given to PACU RN, Post -op Vital signs reviewed and stable and Patient moving all extremities  Post vital signs: Reviewed and stable  Complications: No apparent anesthesia complications

## 2023-04-13 NOTE — Discharge Instructions (Addendum)
Post-surgical Instructions, Outpatient Surgery  You may expect to feel dizzy, weak, and drowsy for as long as 24 hours after receiving the medicine that made you sleep (anesthetic). For the first 24 hours after your surgery:   Do not drive a car, ride a bicycle, participate in physical activities, or take public transportation until you are done taking narcotic pain medicines or as directed by Dr. Briscoe Deutscher.  Do not drink alcohol or take tranquilizers.  Do not take medicine that has not been prescribed by your physicians.  Do not sign important papers or make important decisions while on narcotic pain medicines.  Have a responsible person with you.   CARE OF INCISION If you have a bandage, you may remove it in one day.  If there are steri-strips or dermabond, just let this loosen on its own.  You may shower on the first day after your surgery.  Do not sit in a tub bath for one week. Wait at least 1 week before restarting clobetasol Avoid activities that may risk injury to your incisions.   PAIN MANAGEMENT Acetaminophen 1000mg  (This is the same as 2-500mg  over the counter extra strength tylenol). Take this every 6 hours for the first 3 days or as needed afterwards for pain Tramadol 50mg  For more severe pain, take one or two tablets every four to six hours as needed for pain control.  (Remember that narcotic pain medications increase your risk of constipation.  If this becomes a problem, you may take an over the counter laxative like miralax.)  DO'S AND DON'T'S Do not take a tub bath for 2 weeks.  You may shower on the first day after your surgery Do not do any heavy lifting for one to two weeks.  This increases the chance of bleeding. Do move around as you feel able.  Stairs are fine.  You may begin to exercise again as you feel able.  Do not lift any weights for two weeks. Do not put anything in the vagina for two weeks--no tampons, intercourse, or douching.    REGULAR  MEDIATIONS/VITAMINS: You may restart all of your regular medications as prescribed. You may restart all of your vitamins as you normally take them.    PLEASE CALL OR SEEK MEDICAL CARE IF: You have persistent nausea and vomiting.  You have trouble eating or drinking.  You have an oral temperature above 100.5.  You have constipation that is not helped by adjusting diet or increasing fluid intake. Pain medicines are a common cause of constipation.  You have heavy vaginal bleeding You have redness or drainage from your incision(s) or there is increasing pain or tenderness near or in the surgical site.    Post Anesthesia Home Care Instructions  Activity: Get plenty of rest for the remainder of the day. A responsible individual must stay with you for 24 hours following the procedure.  For the next 24 hours, DO NOT: -Drive a car -Advertising copywriter -Drink alcoholic beverages -Take any medication unless instructed by your physician -Make any legal decisions or sign important papers.  Meals: Start with liquid foods such as gelatin or soup. Progress to regular foods as tolerated. Avoid greasy, spicy, heavy foods. If nausea and/or vomiting occur, drink only clear liquids until the nausea and/or vomiting subsides. Call your physician if vomiting continues.  Special Instructions/Symptoms: Your throat may feel dry or sore from the anesthesia or the breathing tube placed in your throat during surgery. If this causes discomfort, gargle with warm salt water.  The discomfort should disappear within 24 hours.  If needed, do not take nay Tylenol until after 12:05 pm today.

## 2023-04-13 NOTE — Brief Op Note (Signed)
04/13/2023  8:24 AM  PATIENT:  Leslie [name]  68 y.o. female  PRE-OPERATIVE DIAGNOSIS:  Vulvar pruritus  POST-OPERATIVE DIAGNOSIS:  Vulvar pruritus  PROCEDURE:  Procedure(s): VULVAR BIOPSY (N/A)  SURGEON:  Surgeons and Role:    Lorriane Shire, MD - Primary  PHYSICIAN ASSISTANT: n/a  ASSISTANTS: none   ANESTHESIA:   local and general  EBL:  15 mL   BLOOD ADMINISTERED:none  DRAINS: none   LOCAL MEDICATIONS USED:  BUPIVICAINE   SPECIMEN:  Source of Specimen:  right and left labial biopsy, clitoral hood biopsy  DISPOSITION OF SPECIMEN:  PATHOLOGY  COUNTS:  YES  TOURNIQUET:  * No tourniquets in log *  DICTATION: .Note written in EPIC  PLAN OF CARE: Discharge to home after PACU  PATIENT DISPOSITION:  PACU - hemodynamically stable.   Delay start of Pharmacological VTE agent (>24hrs) due to surgical blood loss or risk of bleeding: not applicable

## 2023-04-13 NOTE — Op Note (Signed)
Madai Loyola PROCEDURE DATE: 04/13/2023  PREOPERATIVE DIAGNOSIS: vulvar pruritus  POSTOPERATIVE DIAGNOSIS: vulvar pruritus PROCEDURE:    vulvar biopsy, pap smear SURGEON: Lorriane Shire, MD ASSISTANT:  none  INDICATIONS: 68 y.o. No obstetric history on file. with vulvar pruritus.  Risks of surgery were discussed with the patient including but not limited to: bleeding which may require transfusion; infection which may require antibiotics; injury to surrounding organs; need for additional procedures including laparotomy;  and other postoperative/anesthesia complications. Written informed consent was obtained.    FINDINGS:  Erythema in groin folds and along inner labia majora, atrophic vaginal introitus with atrophic urethral meatus, normal appearing cervix, thickened and friable clitoral hood, 1.5cm ulcerated lesion on upper inner right labia  ANESTHESIA: General, local INTRAVENOUS FLUIDS:  475 ml of LR ESTIMATED BLOOD LOSS:  15 ml SPECIMENS: left labial biopsy, right labial biopsy, clitoral hood biopsy, cervical pap smear COMPLICATIONS:  None immediate.  PROCEDURE: The patient was taken to the operating room and placed under general anesthesia. SCDs were in place.  Time out was performed. Patient was placed in dorsolithotomy in [place] stirrups. She was prepped externally and draped in the usual sterile fashion. A speculum was placeed in the vagina. The cervix was visualized. Pap smear was collected. The inner labia and vaginal canal were then prepped with betadine. 0.5% bupivicaine was injected into the ulcerated lesion of the right inner upper labia, the left upper inner labia and the clitoral hood. A 3mm punch biopsy was done and the biopsy tissue was picked upw with sterile forces and excised sharply on the right and then left. The tissue of the clitoral hood was removed sharply and bluntly. The punch biopsy sites were closed with 4-0 vicryl with single interrupted suture. The general area  was irrigated with additional anesthetic.  All instrument, needle and lap counts were correct x2. The patient was awakened and is recovering in stable condition.  Lorriane Shire, MD Minimally Invasive Gynecologic Surgery  Obstetrics and Gynecology, [facility] for [facility], [facility] 04/13/2023

## 2023-04-13 NOTE — Anesthesia Preprocedure Evaluation (Addendum)
Anesthesia Evaluation  Patient identified by MRN, date of birth, ID band Patient awake    Reviewed: Allergy & Precautions, NPO status , Patient's Chart, lab work & pertinent test results  History of Anesthesia Complications Negative for: history of anesthetic complications  Airway Mallampati: II  TM Distance: >3 FB Neck ROM: Full    Dental no notable dental hx.    Pulmonary former smoker   Pulmonary exam normal        Cardiovascular negative cardio ROS Normal cardiovascular exam     Neuro/Psych   Anxiety Depression       GI/Hepatic Neg liver ROS,,,S/P gastric bypass   Endo/Other  Hypothyroidism  Class 3 obesity  Renal/GU negative Renal ROS     Musculoskeletal negative musculoskeletal ROS (+)    Abdominal   Peds  Hematology negative hematology ROS (+)   Anesthesia Other Findings Day of surgery medications reviewed with patient.  Reproductive/Obstetrics Vulvar pruritus                             Anesthesia Physical Anesthesia Plan  ASA: 3  Anesthesia Plan: General   Post-op Pain Management: Tylenol PO (pre-op)*   Induction: Intravenous  PONV Risk Score and Plan: 3 and Treatment may vary due to age or medical condition, Ondansetron, Dexamethasone and Midazolam  Airway Management Planned: LMA  Additional Equipment: None  Intra-op Plan:   Post-operative Plan: Extubation in OR  Informed Consent: I have reviewed the patients History and Physical, chart, labs and discussed the procedure including the risks, benefits and alternatives for the proposed anesthesia with the patient or authorized representative who has indicated his/her understanding and acceptance.     Dental advisory given  Plan Discussed with: CRNA  Anesthesia Plan Comments:        Anesthesia Quick Evaluation

## 2023-04-13 NOTE — Anesthesia Procedure Notes (Signed)
Procedure Name: LMA Insertion Date/Time: 04/13/2023 7:43 AM  Performed by: Jessica Priest, CRNAPre-anesthesia Checklist: Patient identified, Emergency Drugs available, Suction available, Patient being monitored and Timeout performed Patient Re-evaluated:Patient Re-evaluated prior to induction Oxygen Delivery Method: Circle system utilized Preoxygenation: Pre-oxygenation with 100% oxygen Induction Type: IV induction Ventilation: Mask ventilation without difficulty LMA: LMA inserted LMA Size: 4.0 Number of attempts: 1 Airway Equipment and Method: Bite block Placement Confirmation: positive ETCO2, breath sounds checked- equal and bilateral and CO2 detector Tube secured with: Tape Dental Injury: Teeth and Oropharynx as per pre-operative assessment

## 2023-04-13 NOTE — H&P (Signed)
OB/GYN Pre-Op History and Physical  Leslie [name] is a 68 y.o. No obstetric history on file. presenting for vulvar lesion biopsy.       Past Medical History:  Diagnosis Date   Anal squamous cell carcinoma (HCC)    Anxiety    Depression    History of adenomatous polyp of colon 2015   Hypothyroidism    IDA (iron deficiency anemia)    hematolody-- dr a. 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 Johna Sheriff   LAPAROSCOPIC ROUX-EN-Y GASTRIC BYPASS WITH UPPER ENDOSCOPY AND REMOVAL OF LAP BAND  02/26/2019   @ [facility] by dr j. dasher   LESION REMOVAL N/A 12/30/2022   Procedure: EXCISION BIOPSY PERIANAL LESION;  Surgeon: Romie Levee, MD;  Location: [facility] [place];  Service: General;  Laterality: N/A;   WISDOM TOOTH EXTRACTION      OB History  No obstetric history on file.    Social History   Socioeconomic History   Marital status: Single    Spouse name: Not on file   Number of children: Not on file   Years of education: Not on file   Highest education level: Not on file  Occupational History   Not on file  Tobacco Use   Smoking status: Former    Types: Cigarettes   Smokeless tobacco: Never   Tobacco comments:    04-12-2023  quit smoking 2000,  started age 87  Vaping Use   Vaping status: Never Used  Substance and Sexual Activity   Alcohol use: Not Currently    Comment: occasional   Drug use: Never   Sexual activity: Not on file  Other Topics Concern   Not on file  Social History Narrative   Not on file   Social Drivers of Health   Financial Resource  Strain: Low Risk  (04/07/2023)   Received from [facility]   Overall Financial Resource Strain (CARDIA)    Difficulty of Paying Living Expenses: Not hard at all  Food Insecurity: No Food Insecurity (04/07/2023)   [facility] from [facility]   Hunger Vital Sign    Worried About Running Out of Food in the Last Year: Never true    Ran Out of Food in the Last Year: Never true  Transportation Needs: No Transportation Needs (04/07/2023)   Received from [facility] - Transportation    Lack of Transportation (Medical): No    Lack of Transportation (Non-Medical): No  Physical Activity: Insufficiently Active (05/18/2022)   Received from [facility], [facility]   Exercise Vital Sign    Days of Exercise per Week: 7 days    Minutes of Exercise per Session: 10 min  Stress: No Stress Concern Present (05/18/2022)   Received from [facility], [facility] - Occupational Stress Questionnaire    Feeling of Stress : Not at all  Social Connections: Moderately Integrated (05/18/2022)   Received from [facility]   Social Network    How would you rate your social network (  family, work, friends)?: Adequate participation with social networks    Family History  Problem Relation Age of Onset   Heart attack Mother    Heart attack Father    Diabetes Neg Hx    Hyperlipidemia Neg Hx    Hypertension Neg Hx    Sudden death Neg Hx    Colon cancer Maternal Grandmother     Medications Prior to Admission  Medication Sig Dispense Refill Last Dose/Taking   acetaminophen (TYLENOL) 325 MG tablet Take 650 mg by mouth every 6 (six) hours as needed.   Past Week   clobetasol ointment (TEMOVATE) 0.05 % Apply to affected area every night for 4 weeks, then every other day for 4 weeks and then twice a week for 4 weeks or until resolution. (Patient taking differently: Apply topically as directed. Apply to affected area every night for 4 weeks, then  every other day for 4 weeks and then twice a week for 4 weeks or until resolution.) 30 g 5 Past Week   FLUoxetine (PROZAC) 40 MG capsule Take 40 mg by mouth at bedtime.   Past Week   fluticasone (FLONASE) 50 MCG/ACT nasal spray Place 2 sprays into both nostrils daily. (Patient taking differently: Place 2 sprays into both nostrils at bedtime.) 16 g 11 Past Month   hydrOXYzine (ATARAX) 25 MG tablet TAKE 1 TABLET(25 MG) BY MOUTH AT BEDTIME AS NEEDED FOR ITCHING (Patient taking differently: Take 25 mg by mouth at bedtime as needed.) 30 tablet 2 Past Week   levothyroxine (SYNTHROID) 137 MCG tablet Take 137 mcg by mouth daily before breakfast.   04/12/2023   mineral oil-hydrophilic petrolatum (AQUAPHOR) ointment Apply topically as needed for dry skin. 420 g 0 04/12/2023   lidocaine (XYLOCAINE) 5 % ointment Apply 1 Application topically as needed. 35.44 g 0 More than a month   traMADol (ULTRAM) 50 MG tablet Take 1 tablet (50 mg total) by mouth every 6 (six) hours as needed. 20 tablet 0 Unknown    No Known Allergies  Review of Systems: Negative except for what is mentioned in HPI.     Physical Exam: BP (!) 156/80   Pulse 64   Temp 97.6 F (36.4 C) (Oral)   Resp 18   Ht 5\' 2"  (1.575 m)   Wt 110.4 kg   SpO2 96%   BMI 44.52 kg/m  CONSTITUTIONAL: Well-developed, well-nourished and in no acute distress.  HENT:  Normocephalic, atraumatic, External right and left ear normal. Oropharynx is clear and moist EYES: Conjunctivae and EOM are normal. Pupils are equal, round, and reactive to light. No scleral icterus.  NECK: Normal range of motion, supple, no masses SKIN: Skin is warm and dry. No rash noted. Not diaphoretic. No erythema. No pallor. NEUROLGIC: Alert and oriented to person, place, and time. Normal reflexes, muscle tone coordination. No cranial nerve deficit noted. PSYCHIATRIC: Normal mood and affect. Normal behavior. Normal judgment and thought content. RESPIRATORY: Normal effort PELVIC:  Deferred   Pertinent Labs/Studies:   No results found for this or any previous visit (from the past 72 hours).     Assessment and Plan :Leslie [name] is a 68 y.o. No obstetric history on file. here for vulvar biopsy for vulvar lesion.   Patient desires surgical management with vulvar biopsy.  The risks of surgery were discussed in detail with the patient including but not limited to: bleeding which may require transfusion or reoperation; infection which may require prolonged hospitalization or re-hospitalization and antibiotic therapy; injury to bowel, bladder, ureters  and major vessels or other surrounding organs which may lead to other procedures; formation of adhesions; need for additional procedures including laparotomy or subsequent procedures secondary to intraoperative injury or abnormal pathology; thromboembolic phenomenon; incisional problems and other postoperative or anesthesia complications.  The postoperative expectations were also discussed in detail. The patient also understands the alternative treatment options which were discussed in full. All questions were answered.   Printed patient education handouts about the procedure were given to the patient to review at home.    Lorriane Shire, M.D. Minimally Invasive Gynecologic Surgery and Pelvic Pain Specialist Attending Obstetrician & Gynecologist, [facility] for [employer], [facility]

## 2023-04-14 ENCOUNTER — Encounter (HOSPITAL_BASED_OUTPATIENT_CLINIC_OR_DEPARTMENT_OTHER): Payer: Self-pay | Admitting: Obstetrics and Gynecology

## 2023-04-14 ENCOUNTER — Telehealth: Payer: Self-pay | Admitting: Obstetrics and Gynecology

## 2023-04-14 DIAGNOSIS — C519 Malignant neoplasm of vulva, unspecified: Secondary | ICD-10-CM

## 2023-04-14 NOTE — Telephone Encounter (Signed)
Called and confirmed ID x2. Patient reports doing well since biopsy, mild tenderness and wondering if ok to put aquaphor on biopsy sites. Reviewed that pathology confirms squamous cell carcinoma. Will place referral to GYN ONC for further management.   FINAL MICROSCOPIC DIAGNOSIS:   A. LABIA, RIGHT INNER, BIOPSY:  - Invasive moderately differentiated squamous cell carcinoma, see  comment   B. CLITORAL, BIOPSY:  - Invasive moderately differentiated squamous cell carcinoma, see  comment   C. 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  Dr. Kenyon Ana reviewed the case and concurs with the above  diagnosis.  Dr. Briscoe Deutscher was paged on 04/14/2023.

## 2023-04-15 ENCOUNTER — Telehealth: Payer: Self-pay | Admitting: *Deleted

## 2023-04-15 NOTE — Telephone Encounter (Signed)
LMOM for the patient to call the office back. Patient needs to be scheduled for a new patient appt.

## 2023-04-18 LAB — CYTOLOGY - PAP
Adequacy: ABSENT
Diagnosis: NEGATIVE

## 2023-04-19 ENCOUNTER — Encounter: Payer: Self-pay | Admitting: Obstetrics and Gynecology

## 2023-04-19 NOTE — Telephone Encounter (Signed)
Spoke with the patient regarding the referral to GYN oncology. Patient scheduled as new patient with Dr Pricilla Holm on 2/14 at 11:15 am. Patient given an arrival time of 10:45 am.  Explained to the patient the the doctor will perform a pelvic exam at this visit. Patient given the policy that only one visitor allowed and that visitor must be over 16 yrs are allowed in the Cancer Center. Patient given the address/phone number for the clinic and that the center offers free valet service. Patient aware that masks required.

## 2023-04-26 ENCOUNTER — Other Ambulatory Visit: Payer: Self-pay | Admitting: Gynecologic Oncology

## 2023-04-26 ENCOUNTER — Telehealth: Payer: Self-pay | Admitting: Oncology

## 2023-04-26 DIAGNOSIS — C519 Malignant neoplasm of vulva, unspecified: Secondary | ICD-10-CM

## 2023-04-26 DIAGNOSIS — C210 Malignant neoplasm of anus, unspecified: Secondary | ICD-10-CM

## 2023-04-26 NOTE — Progress Notes (Signed)
Based on new diagnosis of carcinoma on the vulva, hx of recent diagnosis of perianal carcinoma, plan for PET scan to evaluate for signs of metastatic disease and to assist with treatment planning.

## 2023-04-26 NOTE — Telephone Encounter (Signed)
Called Leslie [name] and advised her that Dr. Pricilla Holm is recommending that she have a PET scan and would like to work on getting it scheduled.  Joane verbalized agreement and asked if it could be scheduled for late morning, early afternoon.

## 2023-04-26 NOTE — Telephone Encounter (Signed)
Called Leslie [name] back with PET appointment for 05/05/23 at 1:00 at [facility].  Advised her to arrive at 12:30 to check in and NPO except water 6 hours prior.

## 2023-04-28 ENCOUNTER — Telehealth: Payer: Self-pay | Admitting: Oncology

## 2023-04-28 NOTE — Telephone Encounter (Signed)
 Left a message advising we are tentatively holding surgery time with Dr. Orvil Bland on 05/09/23.  Also that [facility] Preadmission testing will be calling to schedule a pre op appointment and to schedule it after seeing Dr. Orvil Bland on 05/06/23.  Requested a return call.

## 2023-04-29 ENCOUNTER — Encounter: Payer: Self-pay | Admitting: Gynecologic Oncology

## 2023-04-29 ENCOUNTER — Telehealth: Payer: Self-pay | Admitting: Oncology

## 2023-04-29 NOTE — Telephone Encounter (Signed)
 Leslie [name] called back and was advised that we are tentatively holding 05/09/23 as a surgery date with Dr. Orvil Bland.  Let her know that preadmission testing at [facility] will be calling her to schedule an appointment.  Leslie [name] verbalized understanding and agreement.

## 2023-05-04 ENCOUNTER — Encounter: Payer: Self-pay | Admitting: *Deleted

## 2023-05-04 ENCOUNTER — Telehealth: Payer: Self-pay | Admitting: *Deleted

## 2023-05-04 DIAGNOSIS — C519 Malignant neoplasm of vulva, unspecified: Secondary | ICD-10-CM | POA: Insufficient documentation

## 2023-05-04 NOTE — Progress Notes (Unsigned)
GYNECOLOGIC ONCOLOGY NEW PATIENT CONSULTATION   Patient Name: Leslie [name]  Patient Age: 68 y.o. Date of Service: 05/06/23 Referring Provider: Dr. 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  Based on my exam today, there is a mass replacing her clitoris.  Given her discomfort with the exam, I was unable to definitively appreciate where the second biopsy was taken.  This makes it difficult to know if this is a separate lesion.  She has vulvar skin changes that are consistent with a diagnosis of lichen sclerosus although none of the biopsies that she had during her recent procedure showed this.  We discussed the pathogenesis of vulvar cancer, specifically the HPV driven pathway and that related to lichen sclerosus.  I am suspicious that she has this chronic skin condition although biopsies have not confirmed this.  Her most recent Pap of the cervix was normal but did not include an HPV test.  I have recommended that we get HPV testing although her preference was to do this at the time of her surgery on Monday given her poor tolerance of pelvic exams.  To help expedite her care, PET scan was ordered prior to her visit today.  This shows marked hypermetabolism along the vulva as well as some uptake within the anus.  There is no definitive metastatic disease.  I reached out to Dr. Maisie Fus, who performed her recent perianal excision to see if she is available on Monday to perform an exam at the time of surgery.  The patient may ultimately need to see her for follow-up after she heals from this upcoming surgery.  Given my inability to examine her well today and given biopsy show a depth of invasion of 1 mm although are not  able to comment on more invasive disease, I am recommending a staged procedure.  This would involve excision of her anterior vulvar lesion which will require at least resection of a portion of her clitoris given the location.  This will also allow me to fully assess her vulvar tissue and take multiple vulvar biopsies or perform smaller resections to rule out multifocal invasive disease.  We discussed that if resection of the larger lesion proves that there is deeper invasion, then she would benefit from surgical lymph node evaluation.  Additional vulvar biopsies or resection of concerning areas will help identify if there is multifocal disease that invades more than 1 mm.  Given her body habitus, we discussed that inguinal lymph node dissection would be challenging and morbid and that a sentinel lymph node biopsy procedure, if possible, would help to decrease the morbidity of this procedure.  Her upcoming procedure on Monday will help clarify whether she is a candidate for sentinel lymph node biopsy.  We reviewed the plan for a local excision versus modified partial radical anterior vulvectomy with removal of the clitoris, possible additional vulvar wide local excisions and/or vulvar biopsies, any other indicated procedures. The risks of surgery were discussed in detail and she understands these to include infection; wound separation; hernia; injury to adjacent organs; bleeding which may require blood transfusion; anesthesia risk; thromboembolic events; possible death; unforeseen complications; possible need for re-exploration; medical complications such as heart attack, stroke, pleural effusion and pneumonia. The patient will receive DVT and  antibiotic prophylaxis as indicated. She voiced a clear understanding. She had the opportunity to ask questions. Perioperative instructions were reviewed with her. Prescriptions for post-op medications were sent to her pharmacy of choice.  A copy of this note was sent to  the patient's referring provider.   70 minutes of total time was spent for this patient encounter, including preparation, face-to-face counseling with the patient and coordination of care, and documentation of the encounter.  Eugene Garnet, MD  Division of Gynecologic Oncology  Department of Obstetrics and Gynecology  [facility]  ___________________________________________  Chief Complaint: Chief Complaint  Patient presents with   Vulvar Cancer    History of Present Illness:  Leslie [name] is a 68 y.o. y.o. female who is seen in consultation at the request of Dr. Briscoe Deutscher for an evaluation of vulvar cancer.  Patient presented in the fall of 2024 with peri-anal itching for several years. Given concern for lichen sclerosus, she was started on clobetasol. 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 The patient underwent peri-anal skin excision on 12/30/22 with Dr. Maisie Fus. Pathology from this showed SCC, grade 1, measuring 1.1 cm with depth of invasion of 0.3 cm. All resection margins negative for dysplasia or carcinoma, no LVI. CT A/P on 01/20/23 revealed no evidence of metastatic disease.  Plan was made follow-up is visit/exam every 3 months for 1 year and then every 6 months for a total of 5 years.   The patient presents today noting that she has had vulvar pruritus for years which she thought was related to chronic yeast infections.  About 2 years ago, she was started on topical estrogen cream.  She began having sores and was ultimately transitioned from this to oral hormones (reports she was on a birth control pill).  She had some vaginal bleeding and ultimately stopped birth control pills.  She was then referred to GYN and was diagnosed with lichen sclerosis.  She was started on clobetasol and much of her symptoms improved significantly.  For at least 2 years, she has had a lesion on her  clitoris that is quite painful, bleeds intermittently.  Symptoms have worsened significantly in the recent months.  She has another area that causes pain along her right mid labia.  She has some dysuria as well as urgency, and urge urinary incontinence.  She endorses baseline bowel function, denies any bleeding with bowel movements.  Denies any vaginal bleeding since stopped birth control pills.  She endorses a good appetite.  Will occasionally have some nausea and emesis, which has happened infrequently since her bypass surgery.  Treatment History: Oncology History Overview Note  Presented with vulvar pruritus   Vulvar cancer (HCC)  04/13/2023 Surgery   EUA, vulvar biopsy, pap test FINDINGS:  Erythema in groin folds and along inner labia majora, atrophic vaginal introitus with atrophic urethral meatus, normal appearing cervix, thickened and friable clitoral hood, 1.5cm ulcerated lesion on upper inner right labia  SPECIMENS: left labial biopsy, right labial biopsy, clitoral hood biopsy, cervical pap smear    04/13/2023 Initial Biopsy   A. LABIA, RIGHT INNER, BIOPSY:  - Invasive moderately differentiated squamous cell carcinoma, see  comment  B. CLITORAL, BIOPSY:  - Invasive moderately differentiated squamous cell carcinoma, see  comment  C. LABIA, LEFT, BIOPSY:  - High-grade squamous intraepithelial lesion (CIN2-3, high grade  dysplasia), see comment    05/04/2023 Initial Diagnosis   Vulvar cancer (HCC)  05/05/2023 Imaging   PET: Marked hypermetabolism associated with the vulvar region with an SUV max of nearly 28.  Moderate hypermetabolism in the anal region, SUV max 7.7.  No pelvic adenopathy.  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  Comment that there are swirling vessels in the root of the small bowel mesentery likely due to loose mesenteric attachment    PAST MEDICAL HISTORY:  Past Medical History:  Diagnosis Date   Anal squamous cell carcinoma (HCC)    Anxiety     Depression    History of adenomatous polyp of colon 2015   Hypothyroidism    IDA (iron deficiency anemia)    hematolody-- dr a. 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   Vulva cancer [facility])      PAST SURGICAL HISTORY:  Past Surgical History:  Procedure Laterality Date   COLONOSCOPY WITH PROPOFOL  06/04/2013   dr Leone Payor   DILATION AND CURETTAGE OF UTERUS     yrs ago   LAPAROSCOPIC GASTRIC BANDING WITH HIATAL HERNIA REPAIR  03/28/2006   @ [facility] by dr b. Johna Sheriff   LAPAROSCOPIC ROUX-EN-Y GASTRIC BYPASS WITH UPPER ENDOSCOPY AND REMOVAL OF LAP BAND  02/26/2019   @ [facility] by dr j. dasher   LESION REMOVAL N/A 12/30/2022   Procedure: EXCISION BIOPSY PERIANAL LESION;  Surgeon: Romie Levee, MD;  Location: [facility];  Service: General;  Laterality: N/A;   VULVA Ples Specter BIOPSY N/A 04/13/2023   Procedure: VULVAR BIOPSY WITH PAPSMERE;  Surgeon: Lorriane Shire, MD;  Location: [facility];  Service: Gynecology;  Laterality: N/A;   WISDOM TOOTH EXTRACTION      OB/GYN HISTORY:  OB History  Gravida Para Term Preterm AB Living  0 0 0 0 0 0  SAB IAB Ectopic Multiple Live Births  0 0 0 0 0  Obstetric Comments  2 adopted children    No LMP recorded. Patient is postmenopausal.  Age at menarche: 76  Age at menopause: 35 Hx of HRT: yes, see HPI Hx of STDs: denies Last pap: 03/2023 - [facility], HPV not performed History of abnormal pap smears: yes  SCREENING STUDIES:  Last mammogram: 2025  Last colonoscopy: 2015  MEDICATIONS: Outpatient Encounter Medications as of 05/06/2023  Medication Sig   clobetasol ointment (TEMOVATE) 0.05 % Apply to affected area every night for 4 weeks, then every other day for 4 weeks and then twice a week for 4 weeks or until resolution. (Patient taking differently: Apply 1  Application topically daily.)   FLUoxetine (PROZAC) 40 MG capsule Take 40 mg by mouth at bedtime.   hydrOXYzine (ATARAX) 25 MG tablet TAKE 1 TABLET(25 MG) BY MOUTH AT BEDTIME AS NEEDED FOR ITCHING (Patient taking differently: Take 25 mg by mouth at bedtime.)   levothyroxine (SYNTHROID) 137 MCG tablet Take 137 mcg by mouth daily before breakfast.   mineral oil-hydrophilic petrolatum (AQUAPHOR) ointment Apply topically as needed for dry skin.   [DISCONTINUED] acetaminophen (TYLENOL) 325 MG tablet Take 650 mg by mouth every 6 (six) hours as needed.   [DISCONTINUED] fluticasone (FLONASE) 50 MCG/ACT nasal spray Place 2 sprays into both nostrils daily. (Patient taking differently: Place 2 sprays into both nostrils at bedtime.)   BIOTIN PO Take 6,000 mcg by mouth daily.   Cholecalciferol 50 MCG (2000 UT) TABS Take 4,000 Units by mouth daily.  loratadine (CLARITIN) 10 MG tablet Take 10 mg by mouth daily as needed for allergies.   Multiple Vitamin (MULTIVITAMIN) capsule Take 2 capsules by mouth daily.   [DISCONTINUED] lidocaine (XYLOCAINE) 5 % ointment Apply 1 Application topically as needed. (Patient not taking: Reported on 04/29/2023)   [DISCONTINUED] traMADol (ULTRAM) 50 MG tablet Take 1 tablet (50 mg total) by mouth every 6 (six) hours as needed for severe pain (pain score 7-10). (Patient not taking: Reported on 04/29/2023)   No facility-administered encounter medications on file as of 05/06/2023.    ALLERGIES:  No Known Allergies   FAMILY HISTORY:  Family History  Problem Relation Age of Onset   Heart attack Mother    Breast cancer Mother    Heart attack Father    Colon cancer Maternal Grandmother    Diabetes Neg Hx    Hyperlipidemia Neg Hx    Hypertension Neg Hx    Sudden death Neg Hx    Prostate cancer Neg Hx    Pancreatic cancer Neg Hx    Ovarian cancer Neg Hx    Endometrial cancer Neg Hx      SOCIAL HISTORY:  Social Connections: Moderately Integrated (05/18/2022)   Received from  [facility]   Social Network    How would you rate your social network (family, work, friends)?: Adequate participation with social networks    REVIEW OF SYSTEMS:  Denies appetite changes, fevers, chills, fatigue, unexplained weight changes. Denies hearing loss, neck lumps or masses, mouth sores, ringing in ears or voice changes. Denies cough or wheezing.  Denies shortness of breath. Denies chest pain or palpitations. Denies leg swelling. Denies abdominal distention, pain, blood in stools, constipation, diarrhea, nausea, vomiting, or early satiety. Denies pain with intercourse, dysuria, frequency, hematuria or incontinence. Denies hot flashes, pelvic pain, vaginal bleeding or vaginal discharge.   Denies joint pain, back pain or muscle pain/cramps. Denies itching, rash, or wounds. Denies dizziness, headaches, numbness or seizures. Denies swollen lymph nodes or glands, denies easy bruising or bleeding. Denies anxiety, depression, confusion, or decreased concentration.  Physical Exam:  Vital Signs for this encounter:  Blood pressure 131/60, pulse 62, temperature 97.9 F (36.6 C), temperature source Oral, resp. rate 20, height 5\' 2"  (1.575 m), weight 246 lb 3.2 oz (111.7 kg), SpO2 100%. Body mass index is 45.03 kg/m. General: Alert, oriented, no acute distress.  HEENT: Normocephalic, atraumatic. Sclera anicteric.  Chest: Clear to auscultation bilaterally. No wheezes, rhonchi, or rales. Cardiovascular: Regular rate and rhythm, no murmurs, rubs, or gallops.  Abdomen: Obese. Normoactive bowel sounds. Soft, nondistended, nontender to palpation. No masses or hepatosplenomegaly appreciated. No palpable fluid wave.  Extremities: Grossly normal range of motion. Warm, well perfused. No edema bilaterally.  Skin: No rashes or lesions.  Lymphatics: No cervical, supraclavicular, or inguinal adenopathy.  GU: External exam is quite limited secondary to her discomfort.  There is what  appears to be an approximately 3 x 3 cm central fungating lesion replacing the clitoris that is at least a centimeter from the urethra.  There is significant loss of architecture along the inner labia with several areas of very shallow ulceration.  I am unable to appreciate either recent biopsy site.  LABORATORY AND RADIOLOGIC DATA:  Outside medical records were reviewed to synthesize the above history, along with the history and physical obtained during the visit.   Lab Results  Component Value Date   WBC 6.0 02/01/2013   HGB 13.1 02/01/2013   HCT 37.6 02/01/2013   PLT  278 02/01/2013   GLUCOSE 88 02/01/2013   CHOL 297 (H) 03/26/2013   TRIG 87 03/26/2013   HDL 54 03/26/2013   LDLCALC 226 (H) 03/26/2013   ALT 10 02/01/2013   AST 17 02/01/2013   NA 135 02/01/2013   K 4.2 02/01/2013   CL 97 02/01/2013   CREATININE 1.08 02/01/2013   BUN 15 02/01/2013   CO2 28 02/01/2013   TSH 0.758 03/11/2014   HGBA1C 5.3 02/01/2013

## 2023-05-04 NOTE — Telephone Encounter (Signed)
Attempted to reach patient. Left a message requesting call back in regards to pre-admission testing.

## 2023-05-04 NOTE — H&P (View-Only) (Signed)
 GYNECOLOGIC ONCOLOGY NEW PATIENT CONSULTATION   Patient Name: Leslie [name]  Patient Age: 68 y.o. Date of Service: 05/06/23 Referring Provider: Dr. 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  Based on my exam today, there is a mass replacing her clitoris.  Given her discomfort with the exam, I was unable to definitively appreciate where the second biopsy was taken.  This makes it difficult to know if this is a separate lesion.  She has vulvar skin changes that are consistent with a diagnosis of lichen sclerosus although none of the biopsies that she had during her recent procedure showed this.  We discussed the pathogenesis of vulvar cancer, specifically the HPV driven pathway and that related to lichen sclerosus.  I am suspicious that she has this chronic skin condition although biopsies have not confirmed this.  Her most recent Pap of the cervix was normal but did not include an HPV test.  I have recommended that we get HPV testing although her preference was to do this at the time of her surgery on Monday given her poor tolerance of pelvic exams.  To help expedite her care, PET scan was ordered prior to her visit today.  This shows marked hypermetabolism along the vulva as well as some uptake within the anus.  There is no definitive metastatic disease.  I reached out to Dr. Maisie Fus, who performed her recent perianal excision to see if she is available on Monday to perform an exam at the time of surgery.  The patient may ultimately need to see her for follow-up after she heals from this upcoming surgery.  Given my inability to examine her well today and given biopsy show a depth of invasion of 1 mm although are not  able to comment on more invasive disease, I am recommending a staged procedure.  This would involve excision of her anterior vulvar lesion which will require at least resection of a portion of her clitoris given the location.  This will also allow me to fully assess her vulvar tissue and take multiple vulvar biopsies or perform smaller resections to rule out multifocal invasive disease.  We discussed that if resection of the larger lesion proves that there is deeper invasion, then she would benefit from surgical lymph node evaluation.  Additional vulvar biopsies or resection of concerning areas will help identify if there is multifocal disease that invades more than 1 mm.  Given her body habitus, we discussed that inguinal lymph node dissection would be challenging and morbid and that a sentinel lymph node biopsy procedure, if possible, would help to decrease the morbidity of this procedure.  Her upcoming procedure on Monday will help clarify whether she is a candidate for sentinel lymph node biopsy.  We reviewed the plan for a local excision versus modified partial radical anterior vulvectomy with removal of the clitoris, possible additional vulvar wide local excisions and/or vulvar biopsies, any other indicated procedures. The risks of surgery were discussed in detail and she understands these to include infection; wound separation; hernia; injury to adjacent organs; bleeding which may require blood transfusion; anesthesia risk; thromboembolic events; possible death; unforeseen complications; possible need for re-exploration; medical complications such as heart attack, stroke, pleural effusion and pneumonia. The patient will receive DVT and  antibiotic prophylaxis as indicated. She voiced a clear understanding. She had the opportunity to ask questions. Perioperative instructions were reviewed with her. Prescriptions for post-op medications were sent to her pharmacy of choice.  A copy of this note was sent to  the patient's referring provider.   70 minutes of total time was spent for this patient encounter, including preparation, face-to-face counseling with the patient and coordination of care, and documentation of the encounter.  Eugene Garnet, MD  Division of Gynecologic Oncology  Department of Obstetrics and Gynecology  [facility]  ___________________________________________  Chief Complaint: Chief Complaint  Patient presents with   Vulvar Cancer    History of Present Illness:  Leslie [name] is a 68 y.o. y.o. female who is seen in consultation at the request of Dr. Briscoe Deutscher for an evaluation of vulvar cancer.  Patient presented in the fall of 2024 with peri-anal itching for several years. Given concern for lichen sclerosus, she was started on clobetasol. 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 The patient underwent peri-anal skin excision on 12/30/22 with Dr. Maisie Fus. Pathology from this showed SCC, grade 1, measuring 1.1 cm with depth of invasion of 0.3 cm. All resection margins negative for dysplasia or carcinoma, no LVI. CT A/P on 01/20/23 revealed no evidence of metastatic disease.  Plan was made follow-up is visit/exam every 3 months for 1 year and then every 6 months for a total of 5 years.   The patient presents today noting that she has had vulvar pruritus for years which she thought was related to chronic yeast infections.  About 2 years ago, she was started on topical estrogen cream.  She began having sores and was ultimately transitioned from this to oral hormones (reports she was on a birth control pill).  She had some vaginal bleeding and ultimately stopped birth control pills.  She was then referred to GYN and was diagnosed with lichen sclerosis.  She was started on clobetasol and much of her symptoms improved significantly.  For at least 2 years, she has had a lesion on her  clitoris that is quite painful, bleeds intermittently.  Symptoms have worsened significantly in the recent months.  She has another area that causes pain along her right mid labia.  She has some dysuria as well as urgency, and urge urinary incontinence.  She endorses baseline bowel function, denies any bleeding with bowel movements.  Denies any vaginal bleeding since stopped birth control pills.  She endorses a good appetite.  Will occasionally have some nausea and emesis, which has happened infrequently since her bypass surgery.  Treatment History: Oncology History Overview Note  Presented with vulvar pruritus   Vulvar cancer (HCC)  04/13/2023 Surgery   EUA, vulvar biopsy, pap test FINDINGS:  Erythema in groin folds and along inner labia majora, atrophic vaginal introitus with atrophic urethral meatus, normal appearing cervix, thickened and friable clitoral hood, 1.5cm ulcerated lesion on upper inner right labia  SPECIMENS: left labial biopsy, right labial biopsy, clitoral hood biopsy, cervical pap smear    04/13/2023 Initial Biopsy   A. LABIA, RIGHT INNER, BIOPSY:  - Invasive moderately differentiated squamous cell carcinoma, see  comment  B. CLITORAL, BIOPSY:  - Invasive moderately differentiated squamous cell carcinoma, see  comment  C. LABIA, LEFT, BIOPSY:  - High-grade squamous intraepithelial lesion (CIN2-3, high grade  dysplasia), see comment    05/04/2023 Initial Diagnosis   Vulvar cancer (HCC)  05/05/2023 Imaging   PET: Marked hypermetabolism associated with the vulvar region with an SUV max of nearly 28.  Moderate hypermetabolism in the anal region, SUV max 7.7.  No pelvic adenopathy.  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  Comment that there are swirling vessels in the root of the small bowel mesentery likely due to loose mesenteric attachment    PAST MEDICAL HISTORY:  Past Medical History:  Diagnosis Date   Anal squamous cell carcinoma (HCC)    Anxiety     Depression    History of adenomatous polyp of colon 2015   Hypothyroidism    IDA (iron deficiency anemia)    hematolody-- dr a. 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   Vulva cancer [facility])      PAST SURGICAL HISTORY:  Past Surgical History:  Procedure Laterality Date   COLONOSCOPY WITH PROPOFOL  06/04/2013   dr Leone Payor   DILATION AND CURETTAGE OF UTERUS     yrs ago   LAPAROSCOPIC GASTRIC BANDING WITH HIATAL HERNIA REPAIR  03/28/2006   @ [facility] by dr b. Johna Sheriff   LAPAROSCOPIC ROUX-EN-Y GASTRIC BYPASS WITH UPPER ENDOSCOPY AND REMOVAL OF LAP BAND  02/26/2019   @ [facility] by dr j. dasher   LESION REMOVAL N/A 12/30/2022   Procedure: EXCISION BIOPSY PERIANAL LESION;  Surgeon: Romie Levee, MD;  Location[facility];  Service: General;  Laterality: N/A;   VULVA Ples Specter BIOPSY N/A 04/13/2023   Procedure: VULVAR BIOPSY WITH PAPSMERE;  Surgeon: Lorriane Shire, MD;  Location: [facility];  Service: Gynecology;  Laterality: N/A;   WISDOM TOOTH EXTRACTION      OB/GYN HISTORY:  OB History  Gravida Para Term Preterm AB Living  0 0 0 0 0 0  SAB IAB Ectopic Multiple Live Births  0 0 0 0 0  Obstetric Comments  2 adopted children    No LMP recorded. Patient is postmenopausal.  Age at menarche: 76  Age at menopause: 35 Hx of HRT: yes, see HPI Hx of STDs: denies Last pap: 03/2023 - [facility], HPV not performed History of abnormal pap smears: yes  SCREENING STUDIES:  Last mammogram: 2025  Last colonoscopy: 2015  MEDICATIONS: Outpatient Encounter Medications as of 05/06/2023  Medication Sig   clobetasol ointment (TEMOVATE) 0.05 % Apply to affected area every night for 4 weeks, then every other day for 4 weeks and then twice a week for 4 weeks or until resolution. (Patient taking differently: Apply 1  Application topically daily.)   FLUoxetine (PROZAC) 40 MG capsule Take 40 mg by mouth at bedtime.   hydrOXYzine (ATARAX) 25 MG tablet TAKE 1 TABLET(25 MG) BY MOUTH AT BEDTIME AS NEEDED FOR ITCHING (Patient taking differently: Take 25 mg by mouth at bedtime.)   levothyroxine (SYNTHROID) 137 MCG tablet Take 137 mcg by mouth daily before breakfast.   mineral oil-hydrophilic petrolatum (AQUAPHOR) ointment Apply topically as needed for dry skin.   [DISCONTINUED] acetaminophen (TYLENOL) 325 MG tablet Take 650 mg by mouth every 6 (six) hours as needed.   [DISCONTINUED] fluticasone (FLONASE) 50 MCG/ACT nasal spray Place 2 sprays into both nostrils daily. (Patient taking differently: Place 2 sprays into both nostrils at bedtime.)   BIOTIN PO Take 6,000 mcg by mouth daily.   Cholecalciferol 50 MCG (2000 UT) TABS Take 4,000 Units by mouth daily.  loratadine (CLARITIN) 10 MG tablet Take 10 mg by mouth daily as needed for allergies.   Multiple Vitamin (MULTIVITAMIN) capsule Take 2 capsules by mouth daily.   [DISCONTINUED] lidocaine (XYLOCAINE) 5 % ointment Apply 1 Application topically as needed. (Patient not taking: Reported on 04/29/2023)   [DISCONTINUED] traMADol (ULTRAM) 50 MG tablet Take 1 tablet (50 mg total) by mouth every 6 (six) hours as needed for severe pain (pain score 7-10). (Patient not taking: Reported on 04/29/2023)   No facility-administered encounter medications on file as of 05/06/2023.    ALLERGIES:  No Known Allergies   FAMILY HISTORY:  Family History  Problem Relation Age of Onset   Heart attack Mother    Breast cancer Mother    Heart attack Father    Colon cancer Maternal Grandmother    Diabetes Neg Hx    Hyperlipidemia Neg Hx    Hypertension Neg Hx    Sudden death Neg Hx    Prostate cancer Neg Hx    Pancreatic cancer Neg Hx    Ovarian cancer Neg Hx    Endometrial cancer Neg Hx      SOCIAL HISTORY:  Social Connections: Moderately Integrated (05/18/2022)   Received from  [facility]   Social Network    How would you rate your social network (family, work, friends)?: Adequate participation with social networks    REVIEW OF SYSTEMS:  Denies appetite changes, fevers, chills, fatigue, unexplained weight changes. Denies hearing loss, neck lumps or masses, mouth sores, ringing in ears or voice changes. Denies cough or wheezing.  Denies shortness of breath. Denies chest pain or palpitations. Denies leg swelling. Denies abdominal distention, pain, blood in stools, constipation, diarrhea, nausea, vomiting, or early satiety. Denies pain with intercourse, dysuria, frequency, hematuria or incontinence. Denies hot flashes, pelvic pain, vaginal bleeding or vaginal discharge.   Denies joint pain, back pain or muscle pain/cramps. Denies itching, rash, or wounds. Denies dizziness, headaches, numbness or seizures. Denies swollen lymph nodes or glands, denies easy bruising or bleeding. Denies anxiety, depression, confusion, or decreased concentration.  Physical Exam:  Vital Signs for this encounter:  Blood pressure 131/60, pulse 62, temperature 97.9 F (36.6 C), temperature source Oral, resp. rate 20, height 5\' 2"  (1.575 m), weight 246 lb 3.2 oz (111.7 kg), SpO2 100%. Body mass index is 45.03 kg/m. General: Alert, oriented, no acute distress.  HEENT: Normocephalic, atraumatic. Sclera anicteric.  Chest: Clear to auscultation bilaterally. No wheezes, rhonchi, or rales. Cardiovascular: Regular rate and rhythm, no murmurs, rubs, or gallops.  Abdomen: Obese. Normoactive bowel sounds. Soft, nondistended, nontender to palpation. No masses or hepatosplenomegaly appreciated. No palpable fluid wave.  Extremities: Grossly normal range of motion. Warm, well perfused. No edema bilaterally.  Skin: No rashes or lesions.  Lymphatics: No cervical, supraclavicular, or inguinal adenopathy.  GU: External exam is quite limited secondary to her discomfort.  There is what  appears to be an approximately 3 x 3 cm central fungating lesion replacing the clitoris that is at least a centimeter from the urethra.  There is significant loss of architecture along the inner labia with several areas of very shallow ulceration.  I am unable to appreciate either recent biopsy site.  LABORATORY AND RADIOLOGIC DATA:  Outside medical records were reviewed to synthesize the above history, along with the history and physical obtained during the visit.   Lab Results  Component Value Date   WBC 6.0 02/01/2013   HGB 13.1 02/01/2013   HCT 37.6 02/01/2013   PLT  278 02/01/2013   GLUCOSE 88 02/01/2013   CHOL 297 (H) 03/26/2013   TRIG 87 03/26/2013   HDL 54 03/26/2013   LDLCALC 226 (H) 03/26/2013   ALT 10 02/01/2013   AST 17 02/01/2013   NA 135 02/01/2013   K 4.2 02/01/2013   CL 97 02/01/2013   CREATININE 1.08 02/01/2013   BUN 15 02/01/2013   CO2 28 02/01/2013   TSH 0.758 03/11/2014   HGBA1C 5.3 02/01/2013

## 2023-05-05 ENCOUNTER — Ambulatory Visit (HOSPITAL_COMMUNITY)
Admission: RE | Admit: 2023-05-05 | Discharge: 2023-05-05 | Disposition: A | Discharge status: Home / Self Care | Payer: Medicare Other | Source: Ambulatory Visit | Attending: Gynecologic Oncology | Admitting: Gynecologic Oncology

## 2023-05-05 ENCOUNTER — Encounter (HOSPITAL_COMMUNITY): Payer: Self-pay

## 2023-05-05 ENCOUNTER — Encounter (HOSPITAL_COMMUNITY): Payer: Self-pay | Admitting: Gynecologic Oncology

## 2023-05-05 DIAGNOSIS — C210 Malignant neoplasm of anus, unspecified: Secondary | ICD-10-CM | POA: Diagnosis present

## 2023-05-05 DIAGNOSIS — C519 Malignant neoplasm of vulva, unspecified: Secondary | ICD-10-CM | POA: Insufficient documentation

## 2023-05-05 LAB — GLUCOSE, CAPILLARY: Glucose-Capillary: 94 mg/dL (ref 70–99)

## 2023-05-05 MED ORDER — FLUDEOXYGLUCOSE F - 18 (FDG) INJECTION
12.1000 | Freq: Once | INTRAVENOUS | Status: AC | PRN
  Administered 2023-05-05: 12.1 via INTRAVENOUS

## 2023-05-05 NOTE — Patient Instructions (Addendum)
SURGICAL WAITING ROOM VISITATION  Patients having surgery or a procedure may have no more than 2 support people in the waiting area - these visitors may rotate.    Children under the age of 79 must have an adult with them who is not the patient.  Due to an increase in RSV and influenza rates and associated hospitalizations, children ages 71 and under may not visit patients in [facility] hospitals.  Visitors with respiratory illnesses are discouraged from visiting and should remain at home.  If the patient needs to stay at the hospital during part of their recovery, the visitor guidelines for inpatient rooms apply. Pre-op nurse will coordinate an appropriate time for 1 support person to accompany patient in pre-op.  This support person may not rotate.    Please refer to the [facility] website for the visitor guidelines for Inpatients (after your surgery is over and you are in a regular room).       Your procedure is scheduled on: Monday, May 09, 2023   Report to [facility] Main Entrance    Report to admitting at 1215 PM   Call this number if you have problems the morning of surgery [phone]   Do not eat food :After Midnight.   After Midnight you may have the following liquids until 11:30 AM DAY OF SURGERY  Water Non-Citrus Juices (without pulp, NO RED-Apple, White grape, White cranberry) Black Coffee (NO MILK/CREAM OR CREAMERS, sugar ok)  Clear Tea (NO MILK/CREAM OR CREAMERS, sugar ok) regular and decaf                             Plain Jell-O (NO RED)                                           Fruit ices (not with fruit pulp, NO RED)                                     Popsicles (NO RED)                                                               Sports drinks like Gatorade (NO RED)  FOLLOW BOWEL PREP AND ANY ADDITIONAL PRE OP INSTRUCTIONS YOU RECEIVED FROM YOUR SURGEON'S OFFICE!!!     Oral Hygiene is also important to reduce your risk of infection.                                     Remember - BRUSH YOUR TEETH THE MORNING OF SURGERY WITH YOUR REGULAR TOOTHPASTE  DENTURES WILL BE REMOVED PRIOR TO SURGERY PLEASE DO NOT APPLY "Poly grip" OR ADHESIVES!!!   Do NOT smoke after Midnight   Stop all vitamins and herbal supplements 7 days before surgery.   Take these medicines the morning of surgery with A SIP OF WATER: Levothyroxine  You may not have any metal on your body including hair pins, jewelry, and body piercing             Do not wear make-up, lotions, powders, perfumes/cologne, or deodorant  Do not wear nail polish including gel and S&S, artificial/acrylic nails, or any other type of covering on natural nails including finger and toenails. If you have artificial nails, gel coating, etc. that needs to be removed by a nail salon please have this removed prior to surgery or surgery may need to be canceled/ delayed if the surgeon/ anesthesia feels like they are unable to be safely monitored.   Do not shave  48 hours prior to surgery.    Do not bring valuables to the hospital. [facility] IS NOT             RESPONSIBLE   FOR VALUABLES.   Contacts, glasses, dentures or bridgework may not be worn into surgery.   Bring small overnight bag day of surgery.   DO NOT BRING YOUR HOME MEDICATIONS TO THE HOSPITAL. PHARMACY WILL DISPENSE MEDICATIONS LISTED ON YOUR MEDICATION LIST TO YOU DURING YOUR ADMISSION IN THE HOSPITAL!    Patients discharged on the day of surgery will not be allowed to drive home.  Someone NEEDS to stay with you for the first 24 hours after anesthesia.   Special Instructions: Bring a copy of your healthcare power of attorney and living will documents the day of surgery if you haven't scanned them before.              Please read over the following fact sheets you were given: IF YOU HAVE QUESTIONS ABOUT YOUR PRE-OP INSTRUCTIONS PLEASE CALL [phone]   If you received a COVID test during your pre-op  visit  it is requested that you wear a mask when out in public, stay away from anyone that may not be feeling well and notify your surgeon if you develop symptoms. If you test positive for Covid or have been in contact with anyone that has tested positive in the last 10 days please notify you surgeon.    [facility] - Preparing for Surgery Before surgery, you can play an important role.  Because skin is not sterile, your skin needs to be as free of germs as possible.  You can reduce the number of germs on your skin by washing with CHG (chlorahexidine gluconate) soap before surgery.  CHG is an antiseptic cleaner which kills germs and bonds with the skin to continue killing germs even after washing. Please DO NOT use if you have an allergy to CHG or antibacterial soaps.  If your skin becomes reddened/irritated stop using the CHG and inform your nurse when you arrive at Short Stay. Do not shave (including legs and underarms) for at least 48 hours prior to the first CHG shower.  You may shave your face/neck.  Please follow these instructions carefully:  1.  Shower with CHG Soap the night before surgery and the  morning of surgery.  2.  If you choose to wash your hair, wash your hair first as usual with your normal  shampoo.  3.  After you shampoo, rinse your hair and body thoroughly to remove the shampoo.                             4.  Use CHG as you would any other liquid soap.  You can apply chg directly to  the skin and wash.  Gently with a scrungie or clean washcloth.  5.  Apply the CHG Soap to your body ONLY FROM THE NECK DOWN.   Do   not use on face/ open                           Wound or open sores. Avoid contact with eyes, ears mouth and   genitals (private parts).                       Wash face,  Genitals (private parts) with your normal soap.             6.  Wash thoroughly, paying special attention to the area where your    surgery  will be performed.  7.  Thoroughly rinse your body with  warm water from the neck down.  8.  DO NOT shower/wash with your normal soap after using and rinsing off the CHG Soap.                9.  Pat yourself dry with a clean towel.            10.  Wear clean pajamas.            11.  Place clean sheets on your bed the night of your first shower and do not  sleep with pets. Day of Surgery : Do not apply any lotions/deodorants the morning of surgery.  Please wear clean clothes to the hospital/surgery center.  FAILURE TO FOLLOW THESE INSTRUCTIONS MAY RESULT IN THE CANCELLATION OF YOUR SURGERY  PATIENT SIGNATURE_________________________________  NURSE SIGNATURE__________________________________  ________________________________________________________________________ WHAT IS A BLOOD TRANSFUSION? Blood Transfusion Information  A transfusion is the replacement of blood or some of its parts. Blood is made up of multiple cells which provide different functions. Red blood cells carry oxygen and are used for blood loss replacement. White blood cells fight against infection. Platelets control bleeding. Plasma helps clot blood. Other blood products are available for specialized needs, such as hemophilia or other clotting disorders. BEFORE THE TRANSFUSION  Who gives blood for transfusions?  Healthy volunteers who are fully evaluated to make sure their blood is safe. This is blood bank blood. Transfusion therapy is the safest it has ever been in the practice of medicine. Before blood is taken from a donor, a complete history is taken to make sure that person has no history of diseases nor engages in risky social behavior (examples are intravenous drug use or sexual activity with multiple partners). The donor's travel history is screened to minimize risk of transmitting infections, such as malaria. The donated blood is tested for signs of infectious diseases, such as HIV and hepatitis. The blood is then tested to be sure it is compatible with you in order to  minimize the chance of a transfusion reaction. If you or a relative donates blood, this is often done in anticipation of surgery and is not appropriate for emergency situations. It takes many days to process the donated blood. RISKS AND COMPLICATIONS Although transfusion therapy is very safe and saves many lives, the main dangers of transfusion include:  Getting an infectious disease. Developing a transfusion reaction. This is an allergic reaction to something in the blood you were given. Every precaution is taken to prevent this. The decision to have a blood transfusion has been considered carefully by your caregiver before blood is given. Blood is not given  unless the benefits outweigh the risks. AFTER THE TRANSFUSION Right after receiving a blood transfusion, you will usually feel much better and more energetic. This is especially true if your red blood cells have gotten low (anemic). The transfusion raises the level of the red blood cells which carry oxygen, and this usually causes an energy increase. The nurse administering the transfusion will monitor you carefully for complications. HOME CARE INSTRUCTIONS  No special instructions are needed after a transfusion. You may find your energy is better. Speak with your caregiver about any limitations on activity for underlying diseases you may have. SEEK MEDICAL CARE IF:  Your condition is not improving after your transfusion. You develop redness or irritation at the intravenous (IV) site. SEEK IMMEDIATE MEDICAL CARE IF:  Any of the following symptoms occur over the next 12 hours: Shaking chills. You have a temperature by mouth above 102 F (38.9 C), not controlled by medicine. Chest, back, or muscle pain. People around you feel you are not acting correctly or are confused. Shortness of breath or difficulty breathing. Dizziness and fainting. You get a rash or develop hives. You have a decrease in urine output. Your urine turns a dark color  or changes to pink, red, or brown. Any of the following symptoms occur over the next 10 days: You have a temperature by mouth above 102 F (38.9 C), not controlled by medicine. Shortness of breath. Weakness after normal activity. The white part of the eye turns yellow (jaundice). You have a decrease in the amount of urine or are urinating less often. Your urine turns a dark color or changes to pink, red, or brown. Document Released: 03/05/2000 Document Revised: 05/31/2011 Document Reviewed: 10/23/2007 [facility] Patient Information 2014 [place], [place].  _______________________________________________________________________

## 2023-05-05 NOTE — Progress Notes (Signed)
For Anesthesia: PCP - Porfirio Oar, PA  Cardiologist -  Endocrinologist- Reather Littler, MD   Bowel Prep reminder:  Chest x-ray -  EKG -  Stress Test -  ECHO -  Cardiac Cath -  Pacemaker/ICD device last checked: Pacemaker orders received: Device Rep notified:  Spinal Cord Stimulator:  Sleep Study -  CPAP -   Fasting Blood Sugar -  Checks Blood Sugar _____ times a day Date and result of last Hgb A1c-  Last dose of GLP1 agonist-  GLP1 instructions:   Last dose of SGLT-2 inhibitors-  SGLT-2 instructions:   Blood Thinner Instructions: Aspirin Instructions: Last Dose:  Activity level: Can go up a flight of stairs and activities of daily living without stopping and without chest pain and/or shortness of breath   Able to exercise without chest pain and/or shortness of breath   Unable to go up a flight of stairs without chest pain and/or shortness of breath     Anesthesia review:   Patient denies shortness of breath, fever, cough and chest pain at PAT appointment   Patient verbalized understanding of instructions that were given to them at the PAT appointment. Patient was also instructed that they will need to review over the PAT instructions again at home before surgery.

## 2023-05-06 ENCOUNTER — Encounter (HOSPITAL_COMMUNITY)
Admission: RE | Admit: 2023-05-06 | Discharge: 2023-05-06 | Disposition: A | Discharge status: Home / Self Care | Payer: Medicare Other | Source: Ambulatory Visit | Attending: Gynecologic Oncology | Admitting: Gynecologic Oncology

## 2023-05-06 ENCOUNTER — Other Ambulatory Visit: Payer: Self-pay

## 2023-05-06 ENCOUNTER — Inpatient Hospital Stay (HOSPITAL_BASED_OUTPATIENT_CLINIC_OR_DEPARTMENT_OTHER): Payer: Medicare Other | Admitting: Gynecologic Oncology

## 2023-05-06 ENCOUNTER — Encounter (HOSPITAL_COMMUNITY): Payer: Self-pay

## 2023-05-06 ENCOUNTER — Encounter: Payer: Self-pay | Admitting: Gynecologic Oncology

## 2023-05-06 ENCOUNTER — Encounter: Payer: Self-pay | Admitting: Obstetrics and Gynecology

## 2023-05-06 VITALS — BP 131/60 | HR 62 | Temp 97.9°F | Resp 20 | Ht 62.0 in | Wt 246.2 lb

## 2023-05-06 DIAGNOSIS — Z803 Family history of malignant neoplasm of breast: Secondary | ICD-10-CM | POA: Insufficient documentation

## 2023-05-06 DIAGNOSIS — F419 Anxiety disorder, unspecified: Secondary | ICD-10-CM | POA: Insufficient documentation

## 2023-05-06 DIAGNOSIS — Z6841 Body Mass Index (BMI) 40.0 and over, adult: Secondary | ICD-10-CM

## 2023-05-06 DIAGNOSIS — Z79899 Other long term (current) drug therapy: Secondary | ICD-10-CM | POA: Insufficient documentation

## 2023-05-06 DIAGNOSIS — Z9884 Bariatric surgery status: Secondary | ICD-10-CM | POA: Insufficient documentation

## 2023-05-06 DIAGNOSIS — C519 Malignant neoplasm of vulva, unspecified: Secondary | ICD-10-CM | POA: Insufficient documentation

## 2023-05-06 DIAGNOSIS — Z01812 Encounter for preprocedural laboratory examination: Secondary | ICD-10-CM | POA: Insufficient documentation

## 2023-05-06 DIAGNOSIS — Z860101 Personal history of adenomatous and serrated colon polyps: Secondary | ICD-10-CM | POA: Insufficient documentation

## 2023-05-06 DIAGNOSIS — N904 Leukoplakia of vulva: Secondary | ICD-10-CM | POA: Insufficient documentation

## 2023-05-06 DIAGNOSIS — N3941 Urge incontinence: Secondary | ICD-10-CM | POA: Insufficient documentation

## 2023-05-06 DIAGNOSIS — F32A Depression, unspecified: Secondary | ICD-10-CM | POA: Insufficient documentation

## 2023-05-06 DIAGNOSIS — L292 Pruritus vulvae: Secondary | ICD-10-CM

## 2023-05-06 DIAGNOSIS — Z79890 Hormone replacement therapy (postmenopausal): Secondary | ICD-10-CM | POA: Insufficient documentation

## 2023-05-06 DIAGNOSIS — E039 Hypothyroidism, unspecified: Secondary | ICD-10-CM | POA: Insufficient documentation

## 2023-05-06 DIAGNOSIS — Z800 Family history of malignant neoplasm of digestive organs: Secondary | ICD-10-CM | POA: Insufficient documentation

## 2023-05-06 DIAGNOSIS — R300 Dysuria: Secondary | ICD-10-CM | POA: Insufficient documentation

## 2023-05-06 HISTORY — DX: Personal history of other diseases of the digestive system: Z87.19

## 2023-05-06 LAB — CBC
HCT: 44.7 % (ref 36.0–46.0)
Hemoglobin: 13.6 g/dL (ref 12.0–15.0)
MCH: 27 pg (ref 26.0–34.0)
MCHC: 30.4 g/dL (ref 30.0–36.0)
MCV: 88.7 fL (ref 80.0–100.0)
Platelets: 316 10*3/uL (ref 150–400)
RBC: 5.04 MIL/uL (ref 3.87–5.11)
RDW: 13.6 % (ref 11.5–15.5)
WBC: 9.9 10*3/uL (ref 4.0–10.5)
nRBC: 0 % (ref 0.0–0.2)

## 2023-05-06 LAB — COMPREHENSIVE METABOLIC PANEL
ALT: 20 U/L (ref 0–44)
AST: 20 U/L (ref 15–41)
Albumin: 3.4 g/dL — ABNORMAL LOW (ref 3.5–5.0)
Alkaline Phosphatase: 91 U/L (ref 38–126)
Anion gap: 11 (ref 5–15)
BUN: 25 mg/dL — ABNORMAL HIGH (ref 8–23)
CO2: 21 mmol/L — ABNORMAL LOW (ref 22–32)
Calcium: 8.9 mg/dL (ref 8.9–10.3)
Chloride: 104 mmol/L (ref 98–111)
Creatinine, Ser: 0.6 mg/dL (ref 0.44–1.00)
GFR, Estimated: >60 mL/min (ref >60)
Glucose, Bld: 92 mg/dL (ref 70–99)
Potassium: 4.5 mmol/L (ref 3.5–5.1)
Sodium: 136 mmol/L (ref 135–145)
Total Bilirubin: 0.5 mg/dL (ref 0.0–1.2)
Total Protein: 7.3 g/dL (ref 6.5–8.1)

## 2023-05-06 LAB — SURGICAL PATHOLOGY

## 2023-05-06 MED ORDER — TRAMADOL HCL 50 MG PO TABS: 50.0000 mg | ORAL_TABLET | Freq: Four times a day (QID) | ORAL | 0 refills | Status: DC | PRN

## 2023-05-06 MED ORDER — SENNOSIDES-DOCUSATE SODIUM 8.6-50 MG PO TABS
2.0000 | ORAL_TABLET | Freq: Every day | ORAL | 0 refills | Status: DC
Start: 2023-05-06 — End: 2023-05-24

## 2023-05-06 NOTE — Progress Notes (Signed)
For Anesthesia: PCP - Porfirio Oar, PA  Cardiologist -  Endocrinologist- Reather Littler, MD    Bowel Prep reminder:N/A   Chest x-ray -  EKG -  Stress Test - N/A ECHO - N/A Cardiac Cath -  N/A Pacemaker/ICD device last checked: N/A Pacemaker orders received: N/A Device Rep notified: N/A   Spinal Cord Stimulator:N/A   Sleep Study - Yes CPAP - N/A   Fasting Blood Sugar - N/A Checks Blood Sugar _____ times a day Date and result of last Hgb A1c-   Last dose of GLP1 agonist-  GLP1 instructions:    Last dose of SGLT-2 inhibitors-  SGLT-2 instructions:    Blood Thinner Instructions: N/A Aspirin Instructions: Last Dose:   Activity level:  Able to exercise without chest pain and/or shortness of breath                              Anesthesia review:    Patient denies shortness of breath, fever, cough and chest pain at PAT appointment     Patient verbalized understanding of instructions that were given to them at the PAT appointment. Patient was also instructed that they will need to review over the PAT instructions again at home before surgery.

## 2023-05-06 NOTE — Patient Instructions (Addendum)
SURGICAL WAITING ROOM VISITATION  Patients having surgery or a procedure may have no more than 2 support people in the waiting area - these visitors may rotate.    Children under the age of 79 must have an adult with them who is not the patient.  Due to an increase in RSV and influenza rates and associated hospitalizations, children ages 71 and under may not visit patients in [facility] hospitals.  Visitors with respiratory illnesses are discouraged from visiting and should remain at home.  If the patient needs to stay at the hospital during part of their recovery, the visitor guidelines for inpatient rooms apply. Pre-op nurse will coordinate an appropriate time for 1 support person to accompany patient in pre-op.  This support person may not rotate.    Please refer to the [facility] website for the visitor guidelines for Inpatients (after your surgery is over and you are in a regular room).       Your procedure is scheduled on: Monday, May 09, 2023   Report to [facility] Main Entrance    Report to admitting at 1215 PM   Call this number if you have problems the morning of surgery [phone]   Do not eat food :After Midnight.   After Midnight you may have the following liquids until 11:30 AM DAY OF SURGERY  Water Non-Citrus Juices (without pulp, NO RED-Apple, White grape, White cranberry) Black Coffee (NO MILK/CREAM OR CREAMERS, sugar ok)  Clear Tea (NO MILK/CREAM OR CREAMERS, sugar ok) regular and decaf                             Plain Jell-O (NO RED)                                           Fruit ices (not with fruit pulp, NO RED)                                     Popsicles (NO RED)                                                               Sports drinks like Gatorade (NO RED)  FOLLOW BOWEL PREP AND ANY ADDITIONAL PRE OP INSTRUCTIONS YOU RECEIVED FROM YOUR SURGEON'S OFFICE!!!     Oral Hygiene is also important to reduce your risk of infection.                                     Remember - BRUSH YOUR TEETH THE MORNING OF SURGERY WITH YOUR REGULAR TOOTHPASTE  DENTURES WILL BE REMOVED PRIOR TO SURGERY PLEASE DO NOT APPLY "Poly grip" OR ADHESIVES!!!   Do NOT smoke after Midnight   Stop all vitamins and herbal supplements 7 days before surgery.   Take these medicines the morning of surgery with A SIP OF WATER: Levothyroxine  You may not have any metal on your body including hair pins, jewelry, and body piercing             Do not wear make-up, lotions, powders, perfumes/cologne, or deodorant  Do not wear nail polish including gel and S&S, artificial/acrylic nails, or any other type of covering on natural nails including finger and toenails. If you have artificial nails, gel coating, etc. that needs to be removed by a nail salon please have this removed prior to surgery or surgery may need to be canceled/ delayed if the surgeon/ anesthesia feels like they are unable to be safely monitored.   Do not shave  48 hours prior to surgery.    Do not bring valuables to the hospital. [facility] IS NOT             RESPONSIBLE   FOR VALUABLES.   Contacts, glasses, dentures or bridgework may not be worn into surgery.   Bring small overnight bag day of surgery.   DO NOT BRING YOUR HOME MEDICATIONS TO THE HOSPITAL. PHARMACY WILL DISPENSE MEDICATIONS LISTED ON YOUR MEDICATION LIST TO YOU DURING YOUR ADMISSION IN THE HOSPITAL!    Patients discharged on the day of surgery will not be allowed to drive home.  Someone NEEDS to stay with you for the first 24 hours after anesthesia.   Special Instructions: Bring a copy of your healthcare power of attorney and living will documents the day of surgery if you haven't scanned them before.              Please read over the following fact sheets you were given: IF YOU HAVE QUESTIONS ABOUT YOUR PRE-OP INSTRUCTIONS PLEASE CALL [phone]   If you received a COVID test during your pre-op  visit  it is requested that you wear a mask when out in public, stay away from anyone that may not be feeling well and notify your surgeon if you develop symptoms. If you test positive for Covid or have been in contact with anyone that has tested positive in the last 10 days please notify you surgeon.    [facility] - Preparing for Surgery Before surgery, you can play an important role.  Because skin is not sterile, your skin needs to be as free of germs as possible.  You can reduce the number of germs on your skin by washing with CHG (chlorahexidine gluconate) soap before surgery.  CHG is an antiseptic cleaner which kills germs and bonds with the skin to continue killing germs even after washing. Please DO NOT use if you have an allergy to CHG or antibacterial soaps.  If your skin becomes reddened/irritated stop using the CHG and inform your nurse when you arrive at Short Stay. Do not shave (including legs and underarms) for at least 48 hours prior to the first CHG shower.  You may shave your face/neck.  Please follow these instructions carefully:  1.  Shower with CHG Soap the night before surgery and the  morning of surgery.  2.  If you choose to wash your hair, wash your hair first as usual with your normal  shampoo.  3.  After you shampoo, rinse your hair and body thoroughly to remove the shampoo.                             4.  Use CHG as you would any other liquid soap.  You can apply chg directly to  the skin and wash.  Gently with a scrungie or clean washcloth.  5.  Apply the CHG Soap to your body ONLY FROM THE NECK DOWN.   Do   not use on face/ open                           Wound or open sores. Avoid contact with eyes, ears mouth and   genitals (private parts).                       Wash face,  Genitals (private parts) with your normal soap.             6.  Wash thoroughly, paying special attention to the area where your    surgery  will be performed.  7.  Thoroughly rinse your body with  warm water from the neck down.  8.  DO NOT shower/wash with your normal soap after using and rinsing off the CHG Soap.                9.  Pat yourself dry with a clean towel.            10.  Wear clean pajamas.            11.  Place clean sheets on your bed the night of your first shower and do not  sleep with pets. Day of Surgery : Do not apply any lotions/deodorants the morning of surgery.  Please wear clean clothes to the hospital/surgery center.  FAILURE TO FOLLOW THESE INSTRUCTIONS MAY RESULT IN THE CANCELLATION OF YOUR SURGERY  PATIENT SIGNATURE_________________________________  NURSE SIGNATURE__________________________________  ________________________________________________________________________ WHAT IS A BLOOD TRANSFUSION? Blood Transfusion Information  A transfusion is the replacement of blood or some of its parts. Blood is made up of multiple cells which provide different functions. Red blood cells carry oxygen and are used for blood loss replacement. White blood cells fight against infection. Platelets control bleeding. Plasma helps clot blood. Other blood products are available for specialized needs, such as hemophilia or other clotting disorders. BEFORE THE TRANSFUSION  Who gives blood for transfusions?  Healthy volunteers who are fully evaluated to make sure their blood is safe. This is blood bank blood. Transfusion therapy is the safest it has ever been in the practice of medicine. Before blood is taken from a donor, a complete history is taken to make sure that person has no history of diseases nor engages in risky social behavior (examples are intravenous drug use or sexual activity with multiple partners). The donor's travel history is screened to minimize risk of transmitting infections, such as malaria. The donated blood is tested for signs of infectious diseases, such as HIV and hepatitis. The blood is then tested to be sure it is compatible with you in order to  minimize the chance of a transfusion reaction. If you or a relative donates blood, this is often done in anticipation of surgery and is not appropriate for emergency situations. It takes many days to process the donated blood. RISKS AND COMPLICATIONS Although transfusion therapy is very safe and saves many lives, the main dangers of transfusion include:  Getting an infectious disease. Developing a transfusion reaction. This is an allergic reaction to something in the blood you were given. Every precaution is taken to prevent this. The decision to have a blood transfusion has been considered carefully by your caregiver before blood is given. Blood is not given  unless the benefits outweigh the risks. AFTER THE TRANSFUSION Right after receiving a blood transfusion, you will usually feel much better and more energetic. This is especially true if your red blood cells have gotten low (anemic). The transfusion raises the level of the red blood cells which carry oxygen, and this usually causes an energy increase. The nurse administering the transfusion will monitor you carefully for complications. HOME CARE INSTRUCTIONS  No special instructions are needed after a transfusion. You may find your energy is better. Speak with your caregiver about any limitations on activity for underlying diseases you may have. SEEK MEDICAL CARE IF:  Your condition is not improving after your transfusion. You develop redness or irritation at the intravenous (IV) site. SEEK IMMEDIATE MEDICAL CARE IF:  Any of the following symptoms occur over the next 12 hours: Shaking chills. You have a temperature by mouth above 102 F (38.9 C), not controlled by medicine. Chest, back, or muscle pain. People around you feel you are not acting correctly or are confused. Shortness of breath or difficulty breathing. Dizziness and fainting. You get a rash or develop hives. You have a decrease in urine output. Your urine turns a dark color  or changes to pink, red, or brown. Any of the following symptoms occur over the next 10 days: You have a temperature by mouth above 102 F (38.9 C), not controlled by medicine. Shortness of breath. Weakness after normal activity. The white part of the eye turns yellow (jaundice). You have a decrease in the amount of urine or are urinating less often. Your urine turns a dark color or changes to pink, red, or brown. Document Released: 03/05/2000 Document Revised: 05/31/2011 Document Reviewed: 10/23/2007 [facility] Patient Information 2014 [place], [place].  _______________________________________________________________________

## 2023-05-06 NOTE — Patient Instructions (Signed)
Preparing for your Surgery  Plan for surgery on May 09, 2023 with Dr. Eugene Garnet at [facility]. You will be scheduled for examination under anesthesia, wide local excision of the vulva with removal of the clitoris, vulvar biopsies, possible anal biopsies, and any other indicated procedures.  Pre-operative Testing -You will receive a phone call from presurgical testing at [facility] to discuss surgery instructions and arrange for lab work if needed.  -Bring your insurance card, copy of an advanced directive if applicable, medication list.  -You should not be taking blood thinners or aspirin at least ten days prior to surgery unless instructed by your surgeon.  -Do not take supplements such as fish oil (omega 3), red yeast rice, turmeric before your surgery. You want to avoid medications with aspirin in them including headache powders such as BC or Goody's), Excedrin migraine.  Day Before Surgery at Home -You will be advised you can have clear liquids up until 3 hours before your surgery.    Your role in recovery Your role is to become active as soon as directed by your doctor, while still giving yourself time to heal.  Rest when you feel tired. You will be asked to do the following in order to speed your recovery:  - Cough and breathe deeply. This helps to clear and expand your lungs and can prevent pneumonia after surgery.  - STAY ACTIVE WHEN YOU GET HOME. Do mild physical activity. Walking or moving your legs help your circulation and body functions return to normal. Do not try to get up or walk alone the first time after surgery.   -If you develop swelling on one leg or the other, pain in the back of your leg, redness/warmth in one of your legs, please call the office or go to the Emergency Room to have a doppler to rule out a blood clot. For shortness of breath, chest pain-seek care in the Emergency Room as soon as possible. - Actively manage your pain.  Managing your pain lets you move in comfort. We will ask you to rate your pain on a scale of zero to 10. It is your responsibility to tell your doctor or nurse where and how much you hurt so your pain can be treated.  Special Considerations -Your final pathology results from surgery should be available around one week after surgery and the results will be relayed to you when available.  -FMLA forms can be faxed to [phone] and please allow 5-7 business days for completion.  Pain Management After Surgery -You will be prescribed your pain medication and bowel regimen medications before surgery so that you can have these available when you are discharged from the hospital. The pain medication is for use ONLY AFTER surgery and a new prescription will not be given.   -Make sure that you have Tylenol and Ibuprofen at home IF YOU ARE ABLE TO TAKE THESE MEDICATIONS to use on a regular basis after surgery for pain control. We recommend alternating the medications every hour to six hours since they work differently and are processed in the body differently for pain relief.  -Review the attached handout on narcotic use and their risks and side effects.   Bowel Regimen -You will be prescribed Sennakot-S to take nightly to prevent constipation especially if you are taking the narcotic pain medication intermittently.  It is important to prevent constipation and drink adequate amounts of liquids. You can stop taking this medication when you are not taking pain  medication and you are back on your normal bowel routine.  Risks of Surgery Risks of surgery are low but include bleeding, infection, damage to surrounding structures, re-operation, blood clots, and very rarely death.  AFTER SURGERY INSTRUCTIONS  Return to work: variable based on occupation  We recommend purchasing several bags of frozen green peas and dividing them into ziploc bags. You will want to keep these in the freezer and have them ready  to use as ice packs to the vulvar incision. Once the ice pack is no longer cold, you can get another from the freezer. The frozen peas mold to your body better than a regular ice pack.   3 days after surgery when the long acting local pain medication has worn off, you can begin use of the topical lidocaine to the area on the vulva as needed for discomfort.  Activity: 1. Be up and out of the bed during the day.  Take a nap if needed.  You may walk up steps but be careful and use the hand rail.  Stair climbing will tire you more than you think, you may need to stop part way and rest.   2. No lifting or straining for 4 weeks over 10 pounds. No pushing, pulling, straining for 4 weeks.  3. No driving for minimum 24 hours after anesthesia after surgery but this is usually longer, up to 5 days since you need to be able to move fast enough to brake and be off pain meds.  Do not drive if you are taking narcotic pain medicine and make sure that your reaction time has returned.   4. You can shower as soon as the next day after surgery. Shower daily. No tub baths or submerging your body in water until cleared by your surgeon. If you have the soap that was given to you by pre-surgical testing that was used before surgery, you do not need to use it afterwards because this can irritate your incisions.   5. No sexual activity and nothing in the vagina for 4-6 weeks.  6. You may experience vulvar spotting and discharge after surgery.  The spotting is normal but if you experience heavy bleeding, call our office.  7. Take Tylenol or ibuprofen first for pain if you are able to take these medications and only use narcotic pain medication for severe pain not relieved by the Tylenol or Ibuprofen.  Monitor your Tylenol intake to a max of 4,000 mg in a 24 hour period. You can alternate these medications after surgery.  Diet: 1. Low sodium Heart Healthy Diet is recommended but you are cleared to resume your normal (before  surgery) diet after your procedure.  2. It is safe to use a laxative, such as Miralax or Colace, if you have difficulty moving your bowels. You have been prescribed Sennakot at bedtime every evening to keep bowel movements regular and to prevent constipation.    Wound Care: 1. Keep clean and dry.  Shower daily.  Reasons to call the Doctor: Fever - Oral temperature greater than 100.4 degrees Fahrenheit Foul-smelling vaginal discharge Difficulty urinating Nausea and vomiting Increased pain at the site of the incision that is unrelieved with pain medicine. Difficulty breathing with or without chest pain New calf pain especially if only on one side Sudden, continuing increased vaginal bleeding with or without clots.   Contacts: For questions or concerns you should contact:  Dr. Eugene Garnet at [phone]  Warner Mccreedy, NP at [phone]  After Hours: call [phone]  and have the GYN Oncologist paged/contacted (after 5 pm or on the weekends).  Messages sent via mychart are for non-urgent matters and are not responded to after hours so for urgent needs, please call the after hours number.

## 2023-05-09 ENCOUNTER — Inpatient Hospital Stay (HOSPITAL_COMMUNITY)
Admission: RE | Admit: 2023-05-09 | Discharge: 2023-05-10 | DRG: 747 | Disposition: A | Discharge status: Home / Self Care | Payer: Medicare Other | Attending: Gynecologic Oncology | Admitting: Gynecologic Oncology

## 2023-05-09 ENCOUNTER — Encounter (HOSPITAL_COMMUNITY): Payer: Self-pay | Admitting: Gynecologic Oncology

## 2023-05-09 ENCOUNTER — Inpatient Hospital Stay (HOSPITAL_COMMUNITY): Payer: Medicare Other | Admitting: Anesthesiology

## 2023-05-09 ENCOUNTER — Encounter (HOSPITAL_COMMUNITY)
Admission: RE | Disposition: A | Discharge status: Home / Self Care | Payer: Self-pay | Source: Ambulatory Visit | Attending: Gynecologic Oncology

## 2023-05-09 ENCOUNTER — Other Ambulatory Visit: Payer: Self-pay

## 2023-05-09 DIAGNOSIS — Z79890 Hormone replacement therapy (postmenopausal): Secondary | ICD-10-CM

## 2023-05-09 DIAGNOSIS — Z9884 Bariatric surgery status: Secondary | ICD-10-CM

## 2023-05-09 DIAGNOSIS — C512 Malignant neoplasm of clitoris: Secondary | ICD-10-CM | POA: Diagnosis present

## 2023-05-09 DIAGNOSIS — Z800 Family history of malignant neoplasm of digestive organs: Secondary | ICD-10-CM

## 2023-05-09 DIAGNOSIS — Z860101 Personal history of adenomatous and serrated colon polyps: Secondary | ICD-10-CM | POA: Diagnosis not present

## 2023-05-09 DIAGNOSIS — C519 Malignant neoplasm of vulva, unspecified: Principal | ICD-10-CM

## 2023-05-09 DIAGNOSIS — Z8249 Family history of ischemic heart disease and other diseases of the circulatory system: Secondary | ICD-10-CM | POA: Diagnosis not present

## 2023-05-09 DIAGNOSIS — Z01411 Encounter for gynecological examination (general) (routine) with abnormal findings: Secondary | ICD-10-CM | POA: Diagnosis present

## 2023-05-09 DIAGNOSIS — L900 Lichen sclerosus et atrophicus: Secondary | ICD-10-CM | POA: Diagnosis present

## 2023-05-09 DIAGNOSIS — Z85048 Personal history of other malignant neoplasm of rectum, rectosigmoid junction, and anus: Secondary | ICD-10-CM

## 2023-05-09 DIAGNOSIS — Z87891 Personal history of nicotine dependence: Secondary | ICD-10-CM

## 2023-05-09 DIAGNOSIS — N3941 Urge incontinence: Secondary | ICD-10-CM | POA: Diagnosis present

## 2023-05-09 DIAGNOSIS — E039 Hypothyroidism, unspecified: Secondary | ICD-10-CM | POA: Diagnosis present

## 2023-05-09 DIAGNOSIS — Z1151 Encounter for screening for human papillomavirus (HPV): Secondary | ICD-10-CM | POA: Diagnosis not present

## 2023-05-09 DIAGNOSIS — F418 Other specified anxiety disorders: Secondary | ICD-10-CM | POA: Diagnosis not present

## 2023-05-09 DIAGNOSIS — Z803 Family history of malignant neoplasm of breast: Secondary | ICD-10-CM

## 2023-05-09 HISTORY — PX: VULVECTOMY: SHX1086

## 2023-05-09 HISTORY — DX: Malignant neoplasm of vulva, unspecified: C51.9

## 2023-05-09 LAB — TYPE AND SCREEN
ABO/RH(D): A POS
Antibody Screen: NEGATIVE

## 2023-05-09 LAB — ABO/RH: ABO/RH(D): A POS

## 2023-05-09 SURGERY — EXAM UNDER ANESTHESIA
Anesthesia: General | Site: Vagina

## 2023-05-09 MED ORDER — OXYCODONE HCL 5 MG PO TABS
5.0000 mg | ORAL_TABLET | Freq: Once | ORAL | Status: AC | PRN
  Administered 2023-05-09: 5 mg via ORAL

## 2023-05-09 MED ORDER — ACETAMINOPHEN 10 MG/ML IV SOLN: 1000.0000 mg | Freq: Once | INTRAVENOUS | Status: DC | PRN

## 2023-05-09 MED ORDER — BUPIVACAINE HCL 0.25 % IJ SOLN
INTRAMUSCULAR | Status: AC
  Filled 2023-05-09: qty 1

## 2023-05-09 MED ORDER — CHLORHEXIDINE GLUCONATE 0.12 % MT SOLN
15.0000 mL | Freq: Once | OROMUCOSAL | Status: AC
  Administered 2023-05-09: 15 mL via OROMUCOSAL

## 2023-05-09 MED ORDER — DIPHENHYDRAMINE HCL 50 MG/ML IJ SOLN
12.5000 mg | Freq: Once | INTRAMUSCULAR | Status: AC | PRN
  Administered 2023-05-09: 12.5 mg via INTRAVENOUS

## 2023-05-09 MED ORDER — DROPERIDOL 2.5 MG/ML IJ SOLN: 0.6250 mg | Freq: Once | INTRAMUSCULAR | Status: DC | PRN

## 2023-05-09 MED ORDER — FENTANYL CITRATE PF 50 MCG/ML IJ SOSY
PREFILLED_SYRINGE | INTRAMUSCULAR | Status: AC
  Filled 2023-05-09: qty 1

## 2023-05-09 MED ORDER — IPRATROPIUM-ALBUTEROL 0.5-2.5 (3) MG/3ML IN SOLN: 3.0000 mL | RESPIRATORY_TRACT | Status: DC

## 2023-05-09 MED ORDER — DEXAMETHASONE SODIUM PHOSPHATE 10 MG/ML IJ SOLN
INTRAMUSCULAR | Status: AC
  Filled 2023-05-09: qty 1

## 2023-05-09 MED ORDER — HYDROMORPHONE HCL 1 MG/ML IJ SOLN
INTRAMUSCULAR | Status: DC | PRN
  Administered 2023-05-09 (×4): .5 mg via INTRAVENOUS

## 2023-05-09 MED ORDER — HEPARIN SODIUM (PORCINE) 5000 UNIT/ML IJ SOLN
5000.0000 [IU] | INTRAMUSCULAR | Status: AC
  Administered 2023-05-09: 5000 [IU] via SUBCUTANEOUS
  Filled 2023-05-09: qty 1

## 2023-05-09 MED ORDER — HYDROMORPHONE HCL 2 MG/ML IJ SOLN
INTRAMUSCULAR | Status: AC
  Filled 2023-05-09: qty 1

## 2023-05-09 MED ORDER — BUPIVACAINE LIPOSOME 1.3 % IJ SUSP
INTRAMUSCULAR | Status: DC | PRN
  Administered 2023-05-09: 20 mL

## 2023-05-09 MED ORDER — PROPOFOL 10 MG/ML IV BOLUS
INTRAVENOUS | Status: AC
  Filled 2023-05-09: qty 20

## 2023-05-09 MED ORDER — FENTANYL CITRATE (PF) 100 MCG/2ML IJ SOLN
INTRAMUSCULAR | Status: AC
Start: 2023-05-09 — End: ?
  Filled 2023-05-09: qty 2

## 2023-05-09 MED ORDER — ONDANSETRON HCL 4 MG/2ML IJ SOLN: 4.0000 mg | Freq: Four times a day (QID) | INTRAMUSCULAR | Status: DC | PRN

## 2023-05-09 MED ORDER — ACETAMINOPHEN 500 MG PO TABS: 1000.0000 mg | ORAL_TABLET | Freq: Once | ORAL | Status: DC

## 2023-05-09 MED ORDER — OXYCODONE HCL 5 MG PO TABS
ORAL_TABLET | ORAL | Status: AC
  Filled 2023-05-09: qty 1

## 2023-05-09 MED ORDER — ONDANSETRON HCL 4 MG/2ML IJ SOLN
INTRAMUSCULAR | Status: AC
  Filled 2023-05-09: qty 2

## 2023-05-09 MED ORDER — ONDANSETRON HCL 4 MG/2ML IJ SOLN
INTRAMUSCULAR | Status: DC | PRN
Start: 2023-05-09 — End: 2023-05-09
  Administered 2023-05-09: 4 mg via INTRAVENOUS

## 2023-05-09 MED ORDER — OXYCODONE HCL 5 MG/5ML PO SOLN: 5.0000 mg | Freq: Once | ORAL | Status: AC | PRN

## 2023-05-09 MED ORDER — ONDANSETRON HCL 4 MG PO TABS: 4.0000 mg | ORAL_TABLET | Freq: Four times a day (QID) | ORAL | Status: DC | PRN

## 2023-05-09 MED ORDER — FENTANYL CITRATE PF 50 MCG/ML IJ SOSY
25.0000 ug | PREFILLED_SYRINGE | INTRAMUSCULAR | Status: DC | PRN
  Administered 2023-05-09 (×2): 50 ug via INTRAVENOUS

## 2023-05-09 MED ORDER — LIDOCAINE HCL (PF) 1 % IJ SOLN
INTRAMUSCULAR | Status: AC
  Filled 2023-05-09: qty 30

## 2023-05-09 MED ORDER — ENOXAPARIN SODIUM 40 MG/0.4ML IJ SOSY: 40.0000 mg | PREFILLED_SYRINGE | INTRAMUSCULAR | Status: DC

## 2023-05-09 MED ORDER — HYDROMORPHONE HCL 1 MG/ML IJ SOLN
0.5000 mg | INTRAMUSCULAR | Status: DC | PRN
Start: 2023-05-09 — End: 2023-05-10

## 2023-05-09 MED ORDER — TRAMADOL HCL 50 MG PO TABS
50.0000 mg | ORAL_TABLET | Freq: Four times a day (QID) | ORAL | Status: DC | PRN
  Administered 2023-05-09 – 2023-05-10 (×2): 50 mg via ORAL
  Filled 2023-05-09 (×2): qty 1

## 2023-05-09 MED ORDER — OXYCODONE HCL 5 MG PO TABS
5.0000 mg | ORAL_TABLET | ORAL | Status: DC | PRN
Start: 2023-05-09 — End: 2023-05-10

## 2023-05-09 MED ORDER — DEXAMETHASONE SODIUM PHOSPHATE 4 MG/ML IJ SOLN: 4.0000 mg | INTRAMUSCULAR | Status: DC

## 2023-05-09 MED ORDER — PROPOFOL 10 MG/ML IV BOLUS
INTRAVENOUS | Status: DC | PRN
  Administered 2023-05-09 (×3): 50 mg via INTRAVENOUS
  Administered 2023-05-09: 150 mg via INTRAVENOUS

## 2023-05-09 MED ORDER — CEFAZOLIN SODIUM-DEXTROSE 2-4 GM/100ML-% IV SOLN
2.0000 g | INTRAVENOUS | Status: AC
  Administered 2023-05-09: 2 g via INTRAVENOUS
  Filled 2023-05-09: qty 100

## 2023-05-09 MED ORDER — DIPHENHYDRAMINE HCL 50 MG/ML IJ SOLN
INTRAMUSCULAR | Status: AC
  Filled 2023-05-09: qty 1

## 2023-05-09 MED ORDER — LEVOTHYROXINE SODIUM 137 MCG PO TABS
137.0000 ug | ORAL_TABLET | Freq: Every day | ORAL | Status: DC
  Administered 2023-05-10: 137 ug via ORAL
  Filled 2023-05-09: qty 1

## 2023-05-09 MED ORDER — LACTATED RINGERS IV SOLN: INTRAVENOUS | Status: DC

## 2023-05-09 MED ORDER — DEXAMETHASONE SODIUM PHOSPHATE 10 MG/ML IJ SOLN
INTRAMUSCULAR | Status: DC | PRN
  Administered 2023-05-09: 10 mg via INTRAVENOUS

## 2023-05-09 MED ORDER — IPRATROPIUM-ALBUTEROL 0.5-2.5 (3) MG/3ML IN SOLN
3.0000 mL | Freq: Once | RESPIRATORY_TRACT | Status: AC | PRN
  Administered 2023-05-09: 3 mL via RESPIRATORY_TRACT
  Filled 2023-05-09: qty 3

## 2023-05-09 MED ORDER — FENTANYL CITRATE (PF) 100 MCG/2ML IJ SOLN
INTRAMUSCULAR | Status: DC | PRN
  Administered 2023-05-09 (×2): 50 ug via INTRAVENOUS

## 2023-05-09 MED ORDER — ACETAMINOPHEN 500 MG PO TABS
1000.0000 mg | ORAL_TABLET | ORAL | Status: AC
  Administered 2023-05-09: 1000 mg via ORAL
  Filled 2023-05-09: qty 2

## 2023-05-09 MED ORDER — ACETAMINOPHEN 500 MG PO TABS
1000.0000 mg | ORAL_TABLET | Freq: Four times a day (QID) | ORAL | Status: DC
Start: 2023-05-09 — End: 2023-05-10
  Administered 2023-05-09 – 2023-05-10 (×2): 1000 mg via ORAL
  Filled 2023-05-09 (×2): qty 2

## 2023-05-09 MED ORDER — ORAL CARE MOUTH RINSE: 15.0000 mL | Freq: Once | OROMUCOSAL | Status: AC

## 2023-05-09 MED ORDER — FLUOXETINE HCL 20 MG PO CAPS
40.0000 mg | ORAL_CAPSULE | Freq: Every day | ORAL | Status: DC
  Administered 2023-05-09: 40 mg via ORAL
  Filled 2023-05-09: qty 2

## 2023-05-09 MED ORDER — BUPIVACAINE HCL (PF) 0.25 % IJ SOLN
INTRAMUSCULAR | Status: DC | PRN
  Administered 2023-05-09: 30 mL

## 2023-05-09 MED ORDER — SENNOSIDES-DOCUSATE SODIUM 8.6-50 MG PO TABS
2.0000 | ORAL_TABLET | Freq: Every day | ORAL | Status: DC
  Administered 2023-05-09: 2 via ORAL
  Filled 2023-05-09: qty 2

## 2023-05-09 MED ORDER — BUPIVACAINE LIPOSOME 1.3 % IJ SUSP
INTRAMUSCULAR | Status: AC
  Filled 2023-05-09: qty 20

## 2023-05-09 MED ORDER — FENTANYL CITRATE PF 50 MCG/ML IJ SOSY
PREFILLED_SYRINGE | INTRAMUSCULAR | Status: AC
  Administered 2023-05-09: 50 ug via INTRAVENOUS
  Filled 2023-05-09: qty 3

## 2023-05-09 SURGICAL SUPPLY — 39 items
BAG COUNTER SPONGE SURGICOUNT (BAG) ×1 IMPLANT
BLADE SURG 15 STRL LF DISP TIS (BLADE) ×1 IMPLANT
BLADE SURG SZ11 CARB STEEL (BLADE) IMPLANT
CATH FOLEY 2WAY SLVR 5CC 12FR (CATHETERS) ×1 IMPLANT
CATH ROBINSON RED A/P 16FR (CATHETERS) IMPLANT
DRAPE POUCH INSTRU U-SHP 10X18 (DRAPES) ×1 IMPLANT
DRAPE SHEET LG 3/4 BI-LAMINATE (DRAPES) ×3 IMPLANT
DRAPE SURG IRRIG POUCH 19X23 (DRAPES) ×3 IMPLANT
DRSG TELFA 3X8 NADH STRL (GAUZE/BANDAGES/DRESSINGS) ×3 IMPLANT
GAUZE 4X4 16PLY ~~LOC~~+RFID DBL (SPONGE) ×3 IMPLANT
GAUZE SPONGE 4X4 12PLY STRL (GAUZE/BANDAGES/DRESSINGS) ×3 IMPLANT
GLOVE BIO SURGEON STRL SZ 6 (GLOVE) ×6 IMPLANT
GOWN STRL REUS W/ TWL LRG LVL3 (GOWN DISPOSABLE) ×3 IMPLANT
KIT TURNOVER KIT A (KITS) ×1 IMPLANT
NDL HYPO 21X1.5 SAFETY (NEEDLE) ×2 IMPLANT
NDL HYPO 25X1 1.5 SAFETY (NEEDLE) IMPLANT
NDL SPNL 22GX7 QUINCKE BK (NEEDLE) IMPLANT
NEEDLE HYPO 21X1.5 SAFETY (NEEDLE) ×2 IMPLANT
NEEDLE HYPO 25X1 1.5 SAFETY (NEEDLE) ×2 IMPLANT
NEEDLE SPNL 22GX7 QUINCKE BK (NEEDLE) IMPLANT
NS IRRIG 1000ML POUR BTL (IV SOLUTION) ×3 IMPLANT
PACK PERINEAL COLD (PAD) ×3 IMPLANT
PACK VAGINAL WOMENS (CUSTOM PROCEDURE TRAY) ×3 IMPLANT
PAD PREP 24X48 CUFFED NSTRL (MISCELLANEOUS) ×3 IMPLANT
SCOPETTES 8 STERILE (MISCELLANEOUS) IMPLANT
SOL PREP POV-IOD 4OZ 10% (MISCELLANEOUS) ×3 IMPLANT
SPONGE T-LAP 18X18 ~~LOC~~+RFID (SPONGE) ×1 IMPLANT
SUT VIC AB 0 CT1 27XBRD ANBCTR (SUTURE) ×1 IMPLANT
SUT VIC AB 0 CT1 27XBRD ANTBC (SUTURE) ×3 IMPLANT
SUT VIC AB 2-0 SH 27X BRD (SUTURE) ×9 IMPLANT
SUT VIC AB 3-0 SH 27XBRD (SUTURE) ×6 IMPLANT
SUT VIC AB 4-0 PS2 27 (SUTURE) ×5 IMPLANT
SYR BULB IRRIG 60ML STRL (SYRINGE) ×3 IMPLANT
SYR CONTROL 10ML LL (SYRINGE) ×1 IMPLANT
TOWEL OR 17X26 10 PK STRL BLUE (TOWEL DISPOSABLE) ×6 IMPLANT
TRAY FOLEY MTR SLVR 16FR STAT (SET/KITS/TRAYS/PACK) ×1 IMPLANT
UNDERPAD 30X36 HEAVY ABSORB (UNDERPADS AND DIAPERS) ×3 IMPLANT
WATER STERILE IRR 500ML POUR (IV SOLUTION) ×6 IMPLANT
YANKAUER SUCT BULB TIP NO VENT (SUCTIONS) ×3 IMPLANT

## 2023-05-09 NOTE — Discharge Instructions (Signed)
 AFTER SURGERY INSTRUCTIONS   Return to work: variable based on occupation   We recommend purchasing several bags of frozen green peas and dividing them into ziploc bags. You will want to keep these in the freezer and have them ready to use as ice packs to the vulvar incision. Once the ice pack is no longer cold, you can get another from the freezer. The frozen peas mold to your body better than a regular ice pack.    3 days after surgery when the long acting local pain medication has worn off, you can begin use of the topical lidocaine to the area on the vulva as needed for discomfort.  You can obtain sennakot-S over the counter at the drug store and take 2 tablets at bedtime every evening unless having diarrhea. It is better to have your stools on the looser side than hard. You can also use miralax as well.   We would recommend limited use of ibuprofen since this plus prozac can increase your risk of gastrointestinal bleeding. Also prozac use with tramadol for pain can have a cumulative effect and increase risk of seizures so would only use tramadol as needed for moderate pain. You can use tylenol as well.   Activity: 1. Be up and out of the bed during the day.  Take a nap if needed.  You may walk up steps but be careful and use the hand rail.  Stair climbing will tire you more than you think, you may need to stop part way and rest.    2. No lifting or straining for 4 weeks over 10 pounds. No pushing, pulling, straining for 4 weeks.   3. No driving for minimum 24 hours after anesthesia after surgery but this is usually longer, up to 5 days since you need to be able to move fast enough to brake and be off pain meds.  Do not drive if you are taking narcotic pain medicine and make sure that your reaction time has returned.    4. You can shower as soon as the next day after surgery. Shower daily. No tub baths or submerging your body in water until cleared by your surgeon. If you have the soap that  was given to you by pre-surgical testing that was used before surgery, you do not need to use it afterwards because this can irritate your incisions.    5. No sexual activity and nothing in the vagina for 4-6 weeks.   6. You may experience vulvar spotting and discharge after surgery.  The spotting is normal but if you experience heavy bleeding, call our office.   7. Take Tylenol or ibuprofen first for pain if you are able to take these medications and only use narcotic pain medication for severe pain not relieved by the Tylenol or Ibuprofen.  Monitor your Tylenol intake to a max of 4,000 mg in a 24 hour period. You can alternate these medications after surgery.   Diet: 1. Low sodium Heart Healthy Diet is recommended but you are cleared to resume your normal (before surgery) diet after your procedure.   2. It is safe to use a laxative, such as Miralax or Colace, if you have difficulty moving your bowels. You have been prescribed Sennakot at bedtime every evening to keep bowel movements regular and to prevent constipation.     Wound Care: 1. Keep clean and dry.  Shower daily.   Reasons to call the Doctor: Fever - Oral temperature greater than 100.4 degrees Fahrenheit  Foul-smelling vaginal discharge Difficulty urinating Nausea and vomiting Increased pain at the site of the incision that is unrelieved with pain medicine. Difficulty breathing with or without chest pain New calf pain especially if only on one side Sudden, continuing increased vaginal bleeding with or without clots.   Contacts: For questions or concerns you should contact:   Dr. Eugene Garnet at [phone]   Warner Mccreedy, NP at [phone]   After Hours: call [phone] and have the GYN Oncologist paged/contacted (after 5 pm or on the weekends).   Messages sent via mychart are for non-urgent matters and are not responded to after hours so for urgent needs, please call the after hours number.

## 2023-05-09 NOTE — Anesthesia Procedure Notes (Deleted)
 Procedure Name: LMA Insertion Date/Time: 05/09/2023 2:22 PM  Performed by: Laurita Quint, CRNAPre-anesthesia Checklist: Patient identified, Emergency Drugs available, Suction available and Patient being monitored Patient Re-evaluated:Patient Re-evaluated prior to induction Oxygen Delivery Method: Circle System Utilized Preoxygenation: Pre-oxygenation with 100% oxygen Induction Type: IV induction Ventilation: Mask ventilation without difficulty LMA: LMA inserted LMA Size: 4.0 Number of attempts: 1 Airway Equipment and Method: Bite block Placement Confirmation: positive ETCO2 Tube secured with: Tape Dental Injury: Teeth and Oropharynx as per pre-operative assessment

## 2023-05-09 NOTE — Transfer of Care (Signed)
 Immediate Anesthesia Transfer of Care Note  Patient: Leslie [name]  Procedure(s) Performed: EXAM UNDER ANESTHESIA,HPV TESTING (Vagina ) PARTIAL MODIFIED RADICAL VULVECTOMY (Vagina )  Patient Location: PACU  Anesthesia Type:General  Level of Consciousness: drowsy  Airway & Oxygen Therapy: Patient Spontanous Breathing and Patient connected to face mask oxygen  Post-op Assessment: Post -op Vital signs reviewed and stable  Post vital signs: Reviewed and stable  Last Vitals:  Vitals Value Taken Time  BP 137/90 05/09/23 1615  Temp    Pulse 73 05/09/23 1616  Resp 8 05/09/23 1616  SpO2 95 % 05/09/23 1616  Vitals shown include unfiled device data.  Last Pain:  Vitals:   05/09/23 1159  TempSrc:   PainSc: 0-No pain         Complications: No notable events documented.

## 2023-05-09 NOTE — Progress Notes (Unsigned)
 Patient here for new patient consultation with Dr. Pricilla Holm and for a pre-operative appointment prior to her scheduled surgery on 05/09/2023. She is scheduled for examination under anesthesia, wide local excision of the vulva with removal of the clitoris, vulvar biopsies, possible anal biopsies, and any other indicated procedures.  The surgery was discussed in detail.  See after visit summary for additional details.    Discussed post-op pain management in detail including the aspects of the enhanced recovery pathway.  Advised her that a new prescription would be sent in for tramadol and it is only to be used for after her upcoming surgery.  We discussed the use of tylenol post-op and to monitor for a maximum of 4,000 mg in a 24 hour period.  Also prescribed sennakot to be used after surgery and to hold if having loose stools.  Discussed bowel regimen in detail.     Discussed measures to take at home to prevent DVT including frequent mobility.  Reportable signs and symptoms of DVT discussed. Post-operative instructions discussed and expectations for after surgery. Incisional care discussed as well including reportable signs and symptoms including erythema, drainage, wound separation.     10 minutes spent preparing information and with the patient.  Verbalizing understanding of material discussed. No needs or concerns voiced at the end of the visit.   Advised patient to call for any needs.  Advised that her post-operative medications had been prescribed and could be picked up at any time.    This appointment is included in the global surgical bundle as pre-operative teaching and has no charge.

## 2023-05-09 NOTE — Brief Op Note (Signed)
 05/09/2023  4:11 PM  PATIENT:  Leslie [name]  68 y.o. female  PRE-OPERATIVE DIAGNOSIS:  VULVAR CANCER  POST-OPERATIVE DIAGNOSIS:  VULVAR CANCER  PROCEDURE:  Procedure(s): EXAM UNDER ANESTHESIA,HPV TESTING (N/A) PARTIAL MODIFIED RADICAL VULVECTOMY (N/A)  SURGEON:  Surgeons and Role:    Carver Fila, MD - Primary  ASSISTANTS: Warner Mccreedy, NP   ANESTHESIA:   general and local with Exparel/marcaine  EBL:  225 mL   BLOOD ADMINISTERED:none  DRAINS: none   LOCAL MEDICATIONS USED:  MARCAINE     SPECIMEN:  see op note  DISPOSITION OF SPECIMEN:  PATHOLOGY  COUNTS:  YES  TOURNIQUET:  * No tourniquets in log *  DICTATION: .Note written in EPIC  PLAN OF CARE: Admit for overnight observation  PATIENT DISPOSITION:  PACU - hemodynamically stable.   Delay start of Pharmacological VTE agent (>24hrs) due to surgical blood loss or risk of bleeding: not applicable

## 2023-05-09 NOTE — Anesthesia Postprocedure Evaluation (Signed)
 Anesthesia Post Note  Patient: Leslie [name]  Procedure(s) Performed: EXAM UNDER ANESTHESIA,HPV TESTING (Vagina ) PARTIAL MODIFIED RADICAL VULVECTOMY (Vagina )     Patient location during evaluation: PACU Anesthesia Type: General Level of consciousness: awake and alert Pain management: pain level controlled Vital Signs Assessment: post-procedure vital signs reviewed and stable Respiratory status: spontaneous breathing, nonlabored ventilation, respiratory function stable and patient connected to nasal cannula oxygen Cardiovascular status: blood pressure returned to baseline and stable Postop Assessment: no apparent nausea or vomiting Anesthetic complications: no   No notable events documented.  Last Vitals:  Vitals:   05/09/23 1745 05/09/23 1816  BP: 131/80 (!) 144/83  Pulse:  89  Resp: (!) 22 18  Temp: 36.6 C 36.7 C  SpO2: 96% 98%    Last Pain:  Vitals:   05/09/23 1816  TempSrc: Oral  PainSc: 3                  [place]

## 2023-05-09 NOTE — Anesthesia Preprocedure Evaluation (Signed)
 Anesthesia Evaluation  Patient identified by MRN, date of birth, ID band Patient awake    Reviewed: Allergy & Precautions, NPO status , Patient's Chart, lab work & pertinent test results  History of Anesthesia Complications Negative for: history of anesthetic complications  Airway Mallampati: II  TM Distance: >3 FB Neck ROM: Full    Dental no notable dental hx.    Pulmonary former smoker   Pulmonary exam normal        Cardiovascular negative cardio ROS Normal cardiovascular exam     Neuro/Psych   Anxiety Depression       GI/Hepatic Neg liver ROS,,,S/P gastric bypass   Endo/Other  Hypothyroidism  Class 3 obesity  Renal/GU negative Renal ROS   Vulvar cancer    Musculoskeletal negative musculoskeletal ROS (+)    Abdominal   Peds  Hematology  (+) Blood dyscrasia, anemia   Anesthesia Other Findings   Reproductive/Obstetrics Vulvar pruritus                              Anesthesia Physical Anesthesia Plan  ASA: 3  Anesthesia Plan: General   Post-op Pain Management: Tylenol PO (pre-op)*   Induction: Intravenous  PONV Risk Score and Plan: 3 and Treatment may vary due to age or medical condition, Ondansetron, Dexamethasone and Midazolam  Airway Management Planned: LMA  Additional Equipment: None  Intra-op Plan:   Post-operative Plan: Extubation in OR  Informed Consent: I have reviewed the patients History and Physical, chart, labs and discussed the procedure including the risks, benefits and alternatives for the proposed anesthesia with the patient or authorized representative who has indicated his/her understanding and acceptance.     Dental advisory given  Plan Discussed with: CRNA  Anesthesia Plan Comments:         Anesthesia Quick Evaluation

## 2023-05-09 NOTE — Anesthesia Procedure Notes (Addendum)
 Procedure Name: LMA Insertion Date/Time: 05/09/2023 2:12 PM  Performed by: Laurita Quint, CRNAPre-anesthesia Checklist: Patient identified, Emergency Drugs available, Suction available and Patient being monitored Patient Re-evaluated:Patient Re-evaluated prior to induction Oxygen Delivery Method: Circle System Utilized Preoxygenation: Pre-oxygenation with 100% oxygen Induction Type: IV induction Ventilation: Mask ventilation without difficulty LMA: LMA inserted LMA Size: 4.0 Number of attempts: 1 Airway Equipment and Method: Bite block Placement Confirmation: positive ETCO2 Tube secured with: Tape Dental Injury: Teeth and Oropharynx as per pre-operative assessment

## 2023-05-09 NOTE — Interval H&P Note (Signed)
 History and Physical Interval Note:  05/09/2023 1:29 PM  Leslie [name]  has presented today for surgery, with the diagnosis of VULVAR CANCER.  The various methods of treatment have been discussed with the patient and family. After consideration of risks, benefits and other options for treatment, the patient has consented to  Procedure(s): EXAM UNDER ANESTHESIA (N/A) WIDE EXCISION VULVECTOMY,REMOVAL OF THE CLITORIS (N/A) VULVAR BIOPSY,ANAL BIOPSY (N/A) as a surgical intervention.  The patient's history has been reviewed, patient examined, no change in status, stable for surgery.  I have reviewed the patient's chart and labs.  Questions were answered to the patient's satisfaction.     Carver Fila

## 2023-05-09 NOTE — Op Note (Signed)
 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  Postoperative Diagnosis:  same as above  Surgery: Partial modified radical anterior vulvectomy with partial removal of the clitoris, multiple vulvar and peri-anal biopsies  Surgeons:  Eugene Garnet, MD Assistant: Warner Mccreedy, NP  Anesthesia: LMA  Estimated blood loss: 250 ml  IVF:  see I&O flowsheet   Urine output: unmeasured   Complications: None apparent  Pathology: Anterior vulva with marking stitch at 12 o'clock, right inner vulva at 9 o'c, right lateral vulva at 9 o'c, left vulva at 1 o'c, left vulva at 5 o'c, peri-anal biopsy at 4 o'c HPV testing  Operative findings: Anterior fungating mass replacing the clitoris measured 4 x 4 cm. Significant thinning of the vulvar tissue with tissue paper appearance and loss of architecture. No areas of ulceration, some slightly raised/bullous areas (biopsied on the vulva). Similar appearance of peri-anal tissue. No distinct masses. On vaginal exam, moderate atrophy noted. Cervix difficult to visualize given no small speculum. On bimanual, no masses or nodularity. On anal exam, no masses or nodularity appreciated.  Pre-procedure photo:   Post-procedure photo:   Procedure: The patient was identified in the preoperative holding area. Informed consent was signed on the chart. Patient was seen history was reviewed and exam was performed.   The patient was then taken to the operating room and placed in the supine position with SCD hose on. Anesthesia was then induced without difficulty. She was then placed in the dorsolithotomy position. HPV collected over my hand (given no small speculum to allow this to be done under direct visualization). The perineum was prepped with Betadine. The vagina was prepped with Betadine. The patient was then draped after the prep was dried. A  Foley catheter (56F) was inserted into the bladder under sterile conditions.  The vulvar and peri-anal tissue was inspected with findings noted above.  The anterior lesion was identified and the marking pen was used to circumscribe the area with appropriate surgical margins. The 15 blade scalpel was used to make an incision through the skin circumferentially as marked. The skin was then grasped and combination of monopolar electrocautery and the handheld bipolar Ligasure was used to separate the tissue from deeper subcutaneous tissue. Dissection was followed down toward but not to the endopelvic fascia. The clitoral stalk was identified, isolated, suture ligated with 0 Vicryl and transected. After the specimen had been completely resected, it was oriented and marked at 12 o'clock with a 0-vicryl suture. The bovie was used to obtain hemostasis at the surgical bed. Some bleeding near the clitoral stalk was made hemostatic with figure of eight using 0 Vicryl. The subcutaneous tissues were irrigated and made hemostatic.   The subcutaneous and deep dermal layers were approximated with 2-0 and 3-0 vicryl mattress sutures to bring the skin edges into approximation and off tension. The wound was closed following langher's lines. The cutaneous layer was closed with interrupted 4-0 vicryl stitches using mattress sutures to ensure a tension free and hemostatic closure. Ultimately, the incision was closed completely with ability to visualize the urethra and foley catheter with some retraction of the upper vulva.   Biopsies were taken along the vulva as noted above. Given increased bleeding from right inner vulvar biopsy, multiple figure of eight stitches with 2-0 Vicryl were used along the biopsy site to achieve hemostasis.  Exparel mixed with 0.25% marcaine was then injected for analgesia.  The perineum and vagina were again irrigated. The foley was left in situ.  All instrument, suture, laparotomy, Ray-Tec, and  needle counts were correct x2. The patient tolerated the procedure well and was taken recovery room in stable condition.   Eugene Garnet MD Gynecologic Oncology

## 2023-05-10 ENCOUNTER — Other Ambulatory Visit (HOSPITAL_COMMUNITY)
Admission: RE | Admit: 2023-05-10 | Discharge: 2023-05-10 | Disposition: A | Discharge status: Home / Self Care | Payer: Medicare Other | Source: Ambulatory Visit | Attending: Gynecologic Oncology | Admitting: Gynecologic Oncology

## 2023-05-10 ENCOUNTER — Encounter (HOSPITAL_COMMUNITY): Payer: Self-pay | Admitting: Gynecologic Oncology

## 2023-05-10 ENCOUNTER — Other Ambulatory Visit: Payer: Self-pay | Admitting: Gynecologic Oncology

## 2023-05-10 DIAGNOSIS — C519 Malignant neoplasm of vulva, unspecified: Secondary | ICD-10-CM

## 2023-05-10 DIAGNOSIS — Z01411 Encounter for gynecological examination (general) (routine) with abnormal findings: Secondary | ICD-10-CM | POA: Insufficient documentation

## 2023-05-10 DIAGNOSIS — Z1151 Encounter for screening for human papillomavirus (HPV): Secondary | ICD-10-CM | POA: Insufficient documentation

## 2023-05-10 LAB — CBC
HCT: 38.8 % (ref 36.0–46.0)
Hemoglobin: 12.1 g/dL (ref 12.0–15.0)
MCH: 27.6 pg (ref 26.0–34.0)
MCHC: 31.2 g/dL (ref 30.0–36.0)
MCV: 88.4 fL (ref 80.0–100.0)
Platelets: 282 10*3/uL (ref 150–400)
RBC: 4.39 MIL/uL (ref 3.87–5.11)
RDW: 13.5 % (ref 11.5–15.5)
WBC: 9.7 10*3/uL (ref 4.0–10.5)
nRBC: 0 % (ref 0.0–0.2)

## 2023-05-10 LAB — BASIC METABOLIC PANEL
Anion gap: 8 (ref 5–15)
BUN: 16 mg/dL (ref 8–23)
CO2: 23 mmol/L (ref 22–32)
Calcium: 8.2 mg/dL — ABNORMAL LOW (ref 8.9–10.3)
Chloride: 104 mmol/L (ref 98–111)
Creatinine, Ser: 0.67 mg/dL (ref 0.44–1.00)
GFR, Estimated: >60 mL/min (ref >60)
Glucose, Bld: 117 mg/dL — ABNORMAL HIGH (ref 70–99)
Potassium: 4.6 mmol/L (ref 3.5–5.1)
Sodium: 135 mmol/L (ref 135–145)

## 2023-05-10 NOTE — Progress Notes (Addendum)
 Patient was given discharge instructions, and all questions were answered. Patient was stable for discharge and was taken to the main exit by wheelchair.  Foley bag was exchanged, and the patient and daughter were education on changing to a leg bag if needed.

## 2023-05-10 NOTE — Plan of Care (Signed)
   Problem: Education: Goal: Knowledge of General Education information will improve Description Including pain rating scale, medication(s)/side effects and non-pharmacologic comfort measures Outcome: Progressing   Problem: Health Behavior/Discharge Planning: Goal: Ability to manage health-related needs will improve Outcome: Progressing

## 2023-05-10 NOTE — Discharge Summary (Signed)
 Physician Discharge Summary  Patient ID: Leslie [name] MRN: [id number] DOB/AGE: May 26, 1955 68 y.o.  Admit date: 05/09/2023 Discharge date: 05/10/2023  Admission Diagnoses: Vulvar cancer [facility])  Discharge Diagnoses:  Principal Problem:   Vulvar cancer [facility])   Discharged Condition:  The patient is in good condition and stable for discharge.    Hospital Course: On 05/09/2023, the patient underwent the following: Procedure(s): EXAM UNDER ANESTHESIA,HPV TESTING, PARTIAL MODIFIED RADICAL VULVECTOMY. The postoperative course was uneventful.  She was discharged to home on postoperative day 1 tolerating a regular diet, ambulating, foley in place due to radical dissection close to the urethra, pain controlled with oral meds, instructed on foley care per RN.   Consults: None  Significant Diagnostic Studies: Am labs  Treatments: surgery: see above  Discharge Exam: Blood pressure 127/61, pulse 71, temperature 98.6 F (37 C), temperature source Oral, resp. rate 17, height 5\' 2"  (1.575 m), weight 246 lb 3.2 oz (111.7 kg), SpO2 93%. General appearance: alert, cooperative, and no distress Resp: clear to auscultation bilaterally Cardio: regular rate and rhythm, S1, S2 normal, no murmur, click, rub or gallop GI: soft, non-tender; bowel sounds normal; no masses,  no organomegaly Extremities: extremities normal, atraumatic, no cyanosis or edema Incision/Wound: Vulvar incision is intact. Peri pad assessed with minimal blood on pad. Foley in place with clear, yellow urine  Disposition: Discharge disposition: 01-Home or Self Care       Discharge Instructions     Call MD for:  difficulty breathing, headache or visual disturbances   Complete by: As directed    Call MD for:  extreme fatigue   Complete by: As directed    Call MD for:  hives   Complete by: As directed    Call MD for:  persistant dizziness or light-headedness   Complete by: As directed    Call MD for:  persistant nausea and  vomiting   Complete by: As directed    Call MD for:  redness, tenderness, or signs of infection (pain, swelling, redness, odor or green/yellow discharge around incision site)   Complete by: As directed    Call MD for:  severe uncontrolled pain   Complete by: As directed    Call MD for:  temperature >100.4   Complete by: As directed    Diet - low sodium heart healthy   Complete by: As directed    Driving Restrictions   Complete by: As directed    No driving for minimum of 24 hours after anesthesia but this is usually longer since you need to be able to brake safely and be off of pain medication so our recommendation would be no driving for at least 5 days or longer based on how you are doing.  Do not take narcotics and drive. You need to make sure your reaction time has returned.   Increase activity slowly   Complete by: As directed    Lifting restrictions   Complete by: As directed    No lifting greater than 10 lbs, pushing, pulling, straining for 6 weeks.   Sexual Activity Restrictions   Complete by: As directed    No sexual activity, nothing in the vagina, for 6 weeks.      Allergies as of 05/10/2023   No Known Allergies      Medication List     TAKE these medications    acetaminophen 500 MG tablet Commonly known as: TYLENOL Take 500 mg by mouth every 6 (six) hours as needed for moderate pain (pain score  4-6).   BIOTIN PO Take 6,000 mcg by mouth daily.   CALCIUM 500 + D PO Take 2 tablets by mouth daily.   Cholecalciferol 50 MCG (2000 UT) Tabs Take 4,000 Units by mouth daily.   clobetasol ointment 0.05 % Commonly known as: TEMOVATE Apply to affected area every night for 4 weeks, then every other day for 4 weeks and then twice a week for 4 weeks or until resolution. What changed:  how much to take how to take this when to take this additional instructions   FLUoxetine 40 MG capsule Commonly known as: PROZAC Take 40 mg by mouth at bedtime.   hydrOXYzine 25 MG  tablet Commonly known as: ATARAX TAKE 1 TABLET(25 MG) BY MOUTH AT BEDTIME AS NEEDED FOR ITCHING What changed: See the new instructions.   ibuprofen 200 MG tablet Commonly known as: ADVIL Take 200 mg by mouth every 6 (six) hours as needed for moderate pain (pain score 4-6).   IRON PO Take 26 mg by mouth daily.   levothyroxine 137 MCG tablet Commonly known as: SYNTHROID Take 137 mcg by mouth daily before breakfast.   loratadine 10 MG tablet Commonly known as: CLARITIN Take 10 mg by mouth daily as needed for allergies.   mineral oil-hydrophilic petrolatum ointment Apply topically as needed for dry skin.   multivitamin capsule Take 2 capsules by mouth daily.   senna-docusate 8.6-50 MG tablet Commonly known as: Senokot-S Take 2 tablets by mouth at bedtime. For AFTER surgery, do not take if having diarrhea   traMADol 50 MG tablet Commonly known as: ULTRAM Take 1 tablet (50 mg total) by mouth every 6 (six) hours as needed for severe pain (pain score 7-10). For AFTER surgery only, do not take and drive   triamcinolone ointment 0.5 % Commonly known as: KENALOG Apply 1 Application topically 2 (two) times daily as needed (rash).        Follow-up Information     Carver Fila, MD Follow up on 05/17/2023.   Specialty: Gynecologic Oncology Why: around 6 pm will be a PHONE visit to check in and discuss path. IN PERSON visit will be on 06/24/2023 at 4:15pm at the [facility] information: [address] [place] [address] [phone]         Warner Mccreedy D, NP Follow up on 05/24/2023.   Specialty: Gynecologic Oncology Why: at 11 am at the [facility] for incision check Contact information: [address] [place] [address] [phone]                 Greater than thirty minutes were spend for face to face discharge instructions and discharge orders/summary in EPIC.   Signed: Doylene Bode 05/10/2023, 9:47 AM

## 2023-05-10 NOTE — Progress Notes (Signed)
   05/10/23 1106  TOC Brief Assessment  Insurance and Status Reviewed  Patient has primary care physician Yes  Home environment has been reviewed resides in private residence  Prior level of function: Independent  Prior/Current Home Services No current home services  Social Drivers of Health Review SDOH reviewed no interventions necessary  Readmission risk has been reviewed Yes  Transition of care needs no transition of care needs at this time

## 2023-05-11 ENCOUNTER — Telehealth: Payer: Self-pay | Admitting: *Deleted

## 2023-05-11 ENCOUNTER — Ambulatory Visit: Payer: Medicare Other | Admitting: Obstetrics and Gynecology

## 2023-05-11 LAB — SURGICAL PATHOLOGY

## 2023-05-11 NOTE — Telephone Encounter (Signed)
 Spoke with Leslie [name] this morning. She states she is eating, drinking and her foley catheter is draining well. She has had a BM and is passing gas. She is taking senokot as prescribed and encouraged her to drink plenty of water. She denies fever or chills. Incisions are dry and intact. She rates her pain 4/10. Her pain is controlled with tramadol.     Instructed to call office with any fever, chills, purulent drainage, uncontrolled pain or any other questions or concerns. Patient verbalizes understanding.   Pt aware of post op appointments as well as the office number [phone] and after hours number [phone] to call if she has any questions or concerns

## 2023-05-13 ENCOUNTER — Encounter: Payer: Self-pay | Admitting: Gynecologic Oncology

## 2023-05-13 NOTE — Patient Instructions (Signed)
 Preparing for your Surgery  Plan for surgery on May 09, 2023 with Dr. Eugene Garnet at [facility]. You will be scheduled for examination under anesthesia, wide local excision of the vulva with removal of the clitoris, vulvar biopsies, possible anal biopsies, and any other indicated procedures.  Pre-operative Testing -You will receive a phone call from presurgical testing at [facility] to discuss surgery instructions and arrange for lab work if needed.  -Bring your insurance card, copy of an advanced directive if applicable, medication list.  -You should not be taking blood thinners or aspirin at least ten days prior to surgery unless instructed by your surgeon.  -Do not take supplements such as fish oil (omega 3), red yeast rice, turmeric before your surgery. You want to avoid medications with aspirin in them including headache powders such as BC or Goody's), Excedrin migraine.  Day Before Surgery at Home -You will be advised you can have clear liquids up until 3 hours before your surgery.    Your role in recovery Your role is to become active as soon as directed by your doctor, while still giving yourself time to heal.  Rest when you feel tired. You will be asked to do the following in order to speed your recovery:  - Cough and breathe deeply. This helps to clear and expand your lungs and can prevent pneumonia after surgery.  - STAY ACTIVE WHEN YOU GET HOME. Do mild physical activity. Walking or moving your legs help your circulation and body functions return to normal. Do not try to get up or walk alone the first time after surgery.   -If you develop swelling on one leg or the other, pain in the back of your leg, redness/warmth in one of your legs, please call the office or go to the Emergency Room to have a doppler to rule out a blood clot. For shortness of breath, chest pain-seek care in the Emergency Room as soon as possible. - Actively manage your pain.  Managing your pain lets you move in comfort. We will ask you to rate your pain on a scale of zero to 10. It is your responsibility to tell your doctor or nurse where and how much you hurt so your pain can be treated.  Special Considerations -Your final pathology results from surgery should be available around one week after surgery and the results will be relayed to you when available.  -FMLA forms can be faxed to [phone] and please allow 5-7 business days for completion.  Pain Management After Surgery -You will be prescribed your pain medication and bowel regimen medications before surgery so that you can have these available when you are discharged from the hospital. The pain medication is for use ONLY AFTER surgery and a new prescription will not be given.   -Make sure that you have Tylenol and Ibuprofen at home IF YOU ARE ABLE TO TAKE THESE MEDICATIONS to use on a regular basis after surgery for pain control. We recommend alternating the medications every hour to six hours since they work differently and are processed in the body differently for pain relief.  -Review the attached handout on narcotic use and their risks and side effects.   Bowel Regimen -You will be prescribed Sennakot-S to take nightly to prevent constipation especially if you are taking the narcotic pain medication intermittently.  It is important to prevent constipation and drink adequate amounts of liquids. You can stop taking this medication when you are not taking pain  medication and you are back on your normal bowel routine.  Risks of Surgery Risks of surgery are low but include bleeding, infection, damage to surrounding structures, re-operation, blood clots, and very rarely death.  AFTER SURGERY INSTRUCTIONS  Return to work: variable based on occupation  We recommend purchasing several bags of frozen green peas and dividing them into ziploc bags. You will want to keep these in the freezer and have them ready  to use as ice packs to the vulvar incision. Once the ice pack is no longer cold, you can get another from the freezer. The frozen peas mold to your body better than a regular ice pack.   3 days after surgery when the long acting local pain medication has worn off, you can begin use of the topical lidocaine to the area on the vulva as needed for discomfort.  Activity: 1. Be up and out of the bed during the day.  Take a nap if needed.  You may walk up steps but be careful and use the hand rail.  Stair climbing will tire you more than you think, you may need to stop part way and rest.   2. No lifting or straining for 4 weeks over 10 pounds. No pushing, pulling, straining for 4 weeks.  3. No driving for minimum 24 hours after anesthesia after surgery but this is usually longer, up to 5 days since you need to be able to move fast enough to brake and be off pain meds.  Do not drive if you are taking narcotic pain medicine and make sure that your reaction time has returned.   4. You can shower as soon as the next day after surgery. Shower daily. No tub baths or submerging your body in water until cleared by your surgeon. If you have the soap that was given to you by pre-surgical testing that was used before surgery, you do not need to use it afterwards because this can irritate your incisions.   5. No sexual activity and nothing in the vagina for 4-6 weeks.  6. You may experience vulvar spotting and discharge after surgery.  The spotting is normal but if you experience heavy bleeding, call our office.  7. Take Tylenol or ibuprofen first for pain if you are able to take these medications and only use narcotic pain medication for severe pain not relieved by the Tylenol or Ibuprofen.  Monitor your Tylenol intake to a max of 4,000 mg in a 24 hour period. You can alternate these medications after surgery.  Diet: 1. Low sodium Heart Healthy Diet is recommended but you are cleared to resume your normal (before  surgery) diet after your procedure.  2. It is safe to use a laxative, such as Miralax or Colace, if you have difficulty moving your bowels. You have been prescribed Sennakot at bedtime every evening to keep bowel movements regular and to prevent constipation.    Wound Care: 1. Keep clean and dry.  Shower daily.  Reasons to call the Doctor: Fever - Oral temperature greater than 100.4 degrees Fahrenheit Foul-smelling vaginal discharge Difficulty urinating Nausea and vomiting Increased pain at the site of the incision that is unrelieved with pain medicine. Difficulty breathing with or without chest pain New calf pain especially if only on one side Sudden, continuing increased vaginal bleeding with or without clots.   Contacts: For questions or concerns you should contact:  Dr. Eugene Garnet at [phone]  Warner Mccreedy, NP at [phone]  After Hours: call [phone]  and have the GYN Oncologist paged/contacted (after 5 pm or on the weekends).  Messages sent via mychart are for non-urgent matters and are not responded to after hours so for urgent needs, please call the after hours number.

## 2023-05-16 ENCOUNTER — Other Ambulatory Visit: Payer: Self-pay | Admitting: Oncology

## 2023-05-16 ENCOUNTER — Encounter: Payer: Self-pay | Admitting: Gynecologic Oncology

## 2023-05-16 NOTE — Progress Notes (Signed)
 Gynecologic Oncology Multi-Disciplinary Disposition Conference Note  Date of the Conference: 05/16/2023  Patient Name: Leslie [name]  Referring Provider: Dr. Briscoe Deutscher Primary GYN Oncologist: Dr. 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 Disposition is for a lymph node biopsy and resection at [facility].   This Multidisciplinary conference took place involving physicians from Gynecologic Oncology, Medical Oncology, Radiation Oncology, Pathology, Radiology along with the Gynecologic Oncology Nurse Practitioner and Gynecologic Oncology Nurse Navigator.  Comprehensive assessment of the patient's malignancy, staging, need for surgery, chemotherapy, radiation therapy, and need for further testing were reviewed. Supportive measures, both inpatient and following discharge were also discussed. The recommended plan of care is documented. Greater than 35 minutes were spent correlating and coordinating this patient's care.

## 2023-05-17 ENCOUNTER — Inpatient Hospital Stay: Payer: Medicare Other | Admitting: Gynecologic Oncology

## 2023-05-17 DIAGNOSIS — C519 Malignant neoplasm of vulva, unspecified: Secondary | ICD-10-CM

## 2023-05-17 DIAGNOSIS — Z7189 Other specified counseling: Secondary | ICD-10-CM

## 2023-05-17 LAB — CERVICOVAGINAL ANCILLARY ONLY
Comment: NEGATIVE
High risk HPV: NEGATIVE

## 2023-05-18 ENCOUNTER — Encounter: Payer: Self-pay | Admitting: Gynecologic Oncology

## 2023-05-18 ENCOUNTER — Telehealth: Payer: Self-pay | Admitting: *Deleted

## 2023-05-18 ENCOUNTER — Telehealth: Payer: Self-pay

## 2023-05-18 NOTE — Telephone Encounter (Signed)
 Per Dr Pricilla Holm records fax to [facility]

## 2023-05-18 NOTE — Progress Notes (Signed)
 Gynecologic Oncology Telehealth Note: Gyn-Onc  I connected with Leslie [name] on 05/18/23 at  6:00 PM EST by telephone and verified that I am speaking with the correct person using two identifiers.  I discussed the limitations, risks, security and privacy concerns of performing an evaluation and management service by telemedicine and the availability of in-person appointments. I also discussed with the patient that there may be a patient responsible charge related to this service. The patient expressed understanding and agreed to proceed.  Other persons participating in the visit and their role in the encounter: none.  Patient's location: home Provider's location: [place]  Reason for Visit: follow-up  Treatment History: Oncology History Overview Note  Presented with vulvar pruritus   Vulvar cancer (HCC)  04/13/2023 Surgery   EUA, vulvar biopsy, pap test FINDINGS:  Erythema in groin folds and along inner labia majora, atrophic vaginal introitus with atrophic urethral meatus, normal appearing cervix, thickened and friable clitoral hood, 1.5cm ulcerated lesion on upper inner right labia  SPECIMENS: left labial biopsy, right labial biopsy, clitoral hood biopsy, cervical pap smear    04/13/2023 Initial Biopsy   A. LABIA, RIGHT INNER, BIOPSY:  - Invasive moderately differentiated squamous cell carcinoma, see  comment  B. CLITORAL, BIOPSY:  - Invasive moderately differentiated squamous cell carcinoma, see  comment  C. 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  Moderate hypermetabolism in the anal region, SUV max 7.7.  No pelvic adenopathy.  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  Comment that there are swirling vessels in the root of the small  bowel mesentery likely due to loose mesenteric attachment     Interval History: Doing well. Looking forward to having foley catheter removed. Denies significant bleeding.   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   Vulva cancer [facility])     Past Surgical History:  Procedure Laterality Date   BREAST REDUCTION SURGERY  2001   COLONOSCOPY WITH PROPOFOL  06/04/2013   dr Leone Payor   DILATION AND CURETTAGE OF UTERUS     yrs ago   LAPAROSCOPIC GASTRIC BANDING WITH HIATAL HERNIA REPAIR  03/28/2006   @ [facility] by dr b. Johna Sheriff   LAPAROSCOPIC ROUX-EN-Y GASTRIC BYPASS WITH UPPER ENDOSCOPY AND REMOVAL OF LAP BAND  02/26/2019   @ [facility] by dr j. dasher   LESION REMOVAL N/A 12/30/2022   Procedure: EXCISION BIOPSY PERIANAL LESION;  Surgeon: Romie Levee, MD;  Location: [facility];  Service: General;  Laterality: N/A;   VULVA Ples Specter BIOPSY N/A 04/13/2023   Procedure: VULVAR BIOPSY WITH PAPSMERE;  Surgeon: Lorriane Shire, MD;  Location:  SURGERY CENTER;  Service: Gynecology;  Laterality: N/A;   VULVECTOMY N/A 05/09/2023   Procedure: PARTIAL MODIFIED RADICAL VULVECTOMY;  Surgeon: Carver Fila, MD;  Location: [facility];  Service: Gynecology;  Laterality: N/A;   WISDOM TOOTH EXTRACTION      Family History  Problem Relation Age of Onset   Heart attack Mother    Breast cancer Mother    Heart attack Father    Colon cancer Maternal Grandmother    Diabetes Neg Hx    Hyperlipidemia Neg Hx    Hypertension Neg Hx    Sudden death Neg  Hx    Prostate cancer Neg Hx    Pancreatic cancer Neg Hx    Ovarian cancer Neg Hx    Endometrial cancer Neg Hx     Social History   Socioeconomic History   Marital status: Single    Spouse name: Not on file   Number of children: Not on file   Years of education: Not on file   Highest education level: Not on file  Occupational History   Not on file  Tobacco Use   Smoking status: Former    Types: Cigarettes   Smokeless tobacco: Never   Tobacco comments:    04-12-2023  quit smoking 2000,  started age 63  Vaping Use   Vaping status: Never Used  Substance and Sexual Activity   Alcohol use: Not Currently    Comment: rare   Drug use: Never   Sexual activity: Not on file  Other Topics Concern   Not on file  Social History Narrative   Not on file   Social Drivers of Health   Financial Resource Strain: Low Risk  (04/07/2023)   Received from [facility]   Overall Financial Resource Strain (CARDIA)    Difficulty of Paying Living Expenses: Not hard at all  Food Insecurity: No Food Insecurity (05/09/2023)   Hunger Vital Sign    Worried About Running Out of Food in the Last Year: Never true    Ran Out of Food in the Last Year: Never true  Transportation Needs: No Transportation Needs (05/09/2023)   PRAPARE - Administrator, Civil Service (Medical): No    Lack of Transportation (Non-Medical): No  Physical Activity: Insufficiently Active (05/18/2022)   [facility] from [facility], [facility]   Exercise Vital Sign    Days of Exercise per Week: 7 days    Minutes of Exercise per Session: 10 min  Stress: No Stress Concern Present (05/18/2022)   Received from [facility], [facility] of Occupational Health - Occupational Stress Questionnaire    Feeling of Stress : Not at all  Social Connections: Patient Declined (05/09/2023)   Social Connection and Isolation Panel [NHANES]    Frequency of Communication with Friends and Family: Patient declined     Frequency of Social Gatherings with Friends and Family: Patient declined    Attends Religious Services: Patient declined    Database administrator or Organizations: Patient declined    Attends Banker Meetings: Patient declined    Marital Status: Patient declined    Current Medications:  Current Outpatient Medications:    acetaminophen (TYLENOL) 500 MG tablet, Take 500 mg by mouth every 6 (six) hours as needed for moderate pain (pain score 4-6)., Disp: , Rfl:    BIOTIN PO, Take 6,000 mcg by mouth daily., Disp: , Rfl:    Calcium Carb-Cholecalciferol (CALCIUM 500 + D PO), Take 2 tablets by mouth daily., Disp: , Rfl:    Cholecalciferol 50 MCG (2000 UT) TABS, Take 4,000 Units by mouth daily., Disp: , Rfl:    clobetasol ointment (TEMOVATE)  0.05 %, Apply to affected area every night for 4 weeks, then every other day for 4 weeks and then twice a week for 4 weeks or until resolution. (Patient taking differently: Apply 1 Application topically daily.), Disp: 30 g, Rfl: 5   Ferrous Sulfate (IRON PO), Take 26 mg by mouth daily., Disp: , Rfl:    FLUoxetine (PROZAC) 40 MG capsule, Take 40 mg by mouth at bedtime., Disp: , Rfl:    hydrOXYzine (ATARAX) 25 MG tablet, TAKE 1 TABLET(25 MG) BY MOUTH AT BEDTIME AS NEEDED FOR ITCHING (Patient taking differently: Take 25 mg by mouth at bedtime.), Disp: 30 tablet, Rfl: 2   ibuprofen (ADVIL) 200 MG tablet, Take 200 mg by mouth every 6 (six) hours as needed for moderate pain (pain score 4-6)., Disp: , Rfl:    levothyroxine (SYNTHROID) 137 MCG tablet, Take 137 mcg by mouth daily before breakfast., Disp: , Rfl:    loratadine (CLARITIN) 10 MG tablet, Take 10 mg by mouth daily as needed for allergies., Disp: , Rfl:    mineral oil-hydrophilic petrolatum (AQUAPHOR) ointment, Apply topically as needed for dry skin., Disp: 420 g, Rfl: 0   Multiple Vitamin (MULTIVITAMIN) capsule, Take 2 capsules by mouth daily., Disp: , Rfl:    senna-docusate (SENOKOT-S) 8.6-50  MG tablet, Take 2 tablets by mouth at bedtime. For AFTER surgery, do not take if having diarrhea, Disp: 30 tablet, Rfl: 0   traMADol (ULTRAM) 50 MG tablet, Take 1 tablet (50 mg total) by mouth every 6 (six) hours as needed for severe pain (pain score 7-10). For AFTER surgery only, do not take and drive, Disp: 15 tablet, Rfl: 0   triamcinolone ointment (KENALOG) 0.5 %, Apply 1 Application topically 2 (two) times daily as needed (rash)., Disp: , Rfl:   Review of Symptoms: Pertinent positives as per HPI.  Physical Exam: Deferred given limitations of phone visit.  Laboratory & Radiologic Studies: A. ANTERIOR VULVA, EXCISION: Invasive and in situ well to moderately differentiated squamous cell carcinoma Tumor measures 2.0 x 1.6 x 0.65 cm and invades to a depth of 6.5 mm (pT1b) Margins free (2.4 mm from deep stromal margin; greater than 10 mm from lateral/side margins) Negative for angiolymphatic invasion One incidental benign lymph node (0/1)  B. RIGHT INNER VULVA, BIOPSY: Severe keratinizing squamous dysplasia (HSIL, VIN 3) Negative for invasive carcinoma  C. LATERAL VULVA, BIOPSY: Mild keratinizing squamous dysplasia (LSIL, VIN 1)  D. PERINEUM, BIOPSY: Mild squamous dysplasia (LSIL, VIN 1) Acute inflammation with ulceration  E. LEFT VULVA, 1:00, BIOPSY: Benign squamous mucosa with chronic inflammation Negative for dysplasia and carcinoma  F. LEFT VULVA, 5:00, BIOPSY: Benign squamous mucosa Negative for dysplasia and carcinoma  G. PERIANAL, 4:00, BIOPSY: Benign squamous mucosa Negative for dysplasia and carcinoma   Assessment & Plan: Leslie [name] is a 68 y.o. woman with Stage IB grade 1 SCC of the vulva who presents for phone follow-up.  Patient doing well post-op. Discussed continued expectations.  Discussed pathology, which shows a small anterior SCC, one biopsy with VIN3, rest with VIN1 or normal. Based on size of lesion and DOI, she would benefit from LN  evaluation via SLN bx. She is certainly at higher risk of full inguinal LND. Would also recommend consideration for repeat excision given close but negative deep margin and treatment of VIN3 along right inner labia. Will work to get procedure scheduled at [facility] 6-8 weeks post-op.  I discussed the assessment and treatment plan with the patient. The patient was provided with an  opportunity to ask questions and all were answered. The patient agreed with the plan and demonstrated an understanding of the instructions.   The patient was advised to call back or see an in-person evaluation if the symptoms worsen or if the condition fails to improve as anticipated.   8 minutes of total time was spent for this patient encounter, including preparation, phone counseling with the patient and coordination of care, and documentation of the encounter.   Eugene Garnet, MD  Division of Gynecologic Oncology  Department of Obstetrics and Gynecology  [facility]

## 2023-05-18 NOTE — Telephone Encounter (Signed)
Pt notified about pap results: negative.  No questions or concerns voiced. 

## 2023-05-18 NOTE — Telephone Encounter (Signed)
-----   Message from Doylene Bode sent at 05/18/2023 10:42 AM EST ----- Please let her know her high risk HPV testing came back negative. THank you ----- Message ----- From: Interface, Lab In Three Zero One Sent: 05/17/2023   1:35 PM EST To: Doylene Bode, NP

## 2023-05-19 ENCOUNTER — Ambulatory Visit: Payer: Medicare Other | Admitting: Obstetrics and Gynecology

## 2023-05-24 ENCOUNTER — Inpatient Hospital Stay: Payer: Medicare Other | Attending: Gynecologic Oncology | Admitting: Gynecologic Oncology

## 2023-05-24 VITALS — BP 121/55 | HR 72 | Temp 98.2°F | Resp 16 | Ht 62.0 in | Wt 245.0 lb

## 2023-05-24 DIAGNOSIS — C519 Malignant neoplasm of vulva, unspecified: Secondary | ICD-10-CM

## 2023-05-24 DIAGNOSIS — Z9079 Acquired absence of other genital organ(s): Secondary | ICD-10-CM

## 2023-05-24 NOTE — Patient Instructions (Addendum)
 Continue with your peri care and foley care.   We will plan on seeing you in the office on March 13 for another incision check and assessment or sooner if needed.

## 2023-05-24 NOTE — Progress Notes (Signed)
 Gynecologic Oncology Post-Operative Follow Up  05/24/2023  Reason for Visit: post-operative follow-up, incision check  Treatment History: Oncology History Overview Note  Presented with vulvar pruritus   Vulvar cancer (HCC)  04/13/2023 Surgery   EUA, vulvar biopsy, pap test FINDINGS:  Erythema in groin folds and along inner labia majora, atrophic vaginal introitus with atrophic urethral meatus, normal appearing cervix, thickened and friable clitoral hood, 1.5cm ulcerated lesion on upper inner right labia  SPECIMENS: left labial biopsy, right labial biopsy, clitoral hood biopsy, cervical pap smear    04/13/2023 Initial Biopsy   A. LABIA, RIGHT INNER, BIOPSY:  - Invasive moderately differentiated squamous cell carcinoma, see  comment  B. CLITORAL, BIOPSY:  - Invasive moderately differentiated squamous cell carcinoma, see  comment  C. 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  Moderate hypermetabolism in the anal region, SUV max 7.7.  No pelvic adenopathy.  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  Comment that there are swirling vessels in the root of the small bowel mesentery likely due to loose mesenteric attachment   On 05/09/2023, she underwent partial modified radical anterior vulvectomy with partial removal of the clitoris, multiple vulvar and peri-anal biopsies with Dr. Eugene Garnet. She had an overnight stay in the hospital and her post-operative course was uneventful. Final pathology returned with: Invasive and in situ well to moderately differentiated squamous cell carcinoma Tumor measures 2.0 x 1.6 x 0.65 cm and invades to a depth of 6.5 mm (pT1b) Margins free (2.4 mm from deep stromal margin; greater than 10 mm from lateral/side  margins) Negative for angiolymphatic invasion One incidental benign lymph node (0/1)  B. RIGHT INNER VULVA, BIOPSY: Severe keratinizing squamous dysplasia (HSIL, VIN 3) Negative for invasive carcinoma  C. LATERAL VULVA, BIOPSY: Mild keratinizing squamous dysplasia (LSIL, VIN 1)  D. PERINEUM, BIOPSY: Mild squamous dysplasia (LSIL, VIN 1) Acute inflammation with ulceration  E. LEFT VULVA, 1:00, BIOPSY: Benign squamous mucosa with chronic inflammation Negative for dysplasia and carcinoma  F. LEFT VULVA, 5:00, BIOPSY: Benign squamous mucosa Negative for dysplasia and carcinoma  G. PERIANAL, 4:00, BIOPSY: Benign squamous mucosa Negative for dysplasia and carcinoma   Interval History: Patient reports overall doing well since surgery. Tolerating diet with no nausea or emesis. Reports minimal vaginal spotting and discharge. No fever, chills. Bowels functioning without difficulty. Minimal vulvar discomfort reported, improving. Foley catheter has been draining clear, yellow urine without issues. No concerns voiced. Questions answered about recommended sentinel lymph node procedure discussed at recent phone visit with Dr. Pricilla Holm.   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   Vulva cancer [facility])     Past Surgical History:  Procedure Laterality Date   BREAST REDUCTION SURGERY  2001   COLONOSCOPY WITH PROPOFOL  06/04/2013   dr Leone Payor  DILATION AND CURETTAGE OF UTERUS     yrs ago   LAPAROSCOPIC GASTRIC BANDING WITH HIATAL HERNIA REPAIR  03/28/2006    @ [facility] by dr b. Johna Sheriff   LAPAROSCOPIC ROUX-EN-Y GASTRIC BYPASS WITH UPPER ENDOSCOPY AND REMOVAL OF LAP BAND  02/26/2019   @ [facility] by dr j. dasher   LESION REMOVAL N/A 12/30/2022   Procedure: EXCISION BIOPSY PERIANAL LESION;  Surgeon: Romie Levee, MD;  Location: [facility];  Service: General;  Laterality: N/A;   VULVA Ples Specter BIOPSY N/A 04/13/2023   Procedure: VULVAR BIOPSY WITH PAPSMERE;  Surgeon: Lorriane Shire, MD;  Location: [facility];  Service: Gynecology;  Laterality: N/A;   VULVECTOMY N/A 05/09/2023   Procedure: PARTIAL MODIFIED RADICAL VULVECTOMY;  Surgeon: Carver Fila, MD;  Location: [facility];  Service: Gynecology;  Laterality: N/A;   WISDOM TOOTH EXTRACTION      Family History  Problem Relation Age of Onset   Heart attack Mother    Breast cancer Mother    Heart attack Father    Colon cancer Maternal Grandmother    Diabetes Neg Hx    Hyperlipidemia Neg Hx    Hypertension Neg Hx    Sudden death Neg Hx    Prostate cancer Neg Hx    Pancreatic cancer Neg Hx    Ovarian cancer Neg Hx    Endometrial cancer Neg Hx     Social History   Socioeconomic History   Marital status: Single    Spouse name: Not on file   Number of children: Not on file   Years of education: Not on file   Highest education level: Not on file  Occupational History   Not on file  Tobacco Use   Smoking status: Former    Types: Cigarettes   Smokeless tobacco: Never   Tobacco comments:    04-12-2023  quit smoking 2000,  started age 75  Vaping Use   Vaping status: Never Used  Substance and Sexual Activity   Alcohol use: Not Currently    Comment: rare   Drug use: Never   Sexual activity: Not on file  Other Topics Concern   Not on file  Social History Narrative   Not on file   Social Drivers of Health   Financial Resource Strain: Low Risk  (04/07/2023)   Received from [facility]   Overall Financial Resource Strain (CARDIA)    Difficulty of  Paying Living Expenses: Not hard at all  Food Insecurity: No Food Insecurity (05/09/2023)   Hunger Vital Sign    Worried About Running Out of Food in the Last Year: Never true    Ran Out of Food in the Last Year: Never true  Transportation Needs: No Transportation Needs (05/09/2023)   PRAPARE - Administrator, Civil Service (Medical): No    Lack of Transportation (Non-Medical): No  Physical Activity: Insufficiently Active (05/18/2022)   [facility]   Exercise Vital Sign    Days of Exercise per Week: 7 days    Minutes of Exercise per Session: 10 min  Stress: No Stress Concern Present (05/18/2022)   Received from [facility] of Occupational Health - Occupational Stress Questionnaire    Feeling of Stress : Not at all  Social Connections: Patient Declined (05/09/2023)   Social Connection and Isolation Panel [NHANES]    Frequency of Communication with Friends and Family: Patient declined    Frequency of Social Gatherings  with Friends and Family: Patient declined    Attends Religious Services: Patient declined    Active Member of Clubs or Organizations: Patient declined    Attends Banker Meetings: Patient declined    Marital Status: Patient declined    Current Medications:  Current Outpatient Medications:    acetaminophen (TYLENOL) 500 MG tablet, Take 500 mg by mouth every 6 (six) hours as needed for moderate pain (pain score 4-6)., Disp: , Rfl:    BIOTIN PO, Take 6,000 mcg by mouth daily., Disp: , Rfl:    Calcium Carb-Cholecalciferol (CALCIUM 500 + D PO), Take 2 tablets by mouth daily., Disp: , Rfl:    Cholecalciferol 50 MCG (2000 UT) TABS, Take 4,000 Units by mouth daily., Disp: , Rfl:    clobetasol ointment (TEMOVATE) 0.05 %, Apply to affected area every night for 4 weeks, then every other day for 4 weeks and then twice a week for 4 weeks or until resolution. (Patient taking differently: Apply 1  Application topically daily.), Disp: 30 g, Rfl: 5   Ferrous Sulfate (IRON PO), Take 26 mg by mouth daily., Disp: , Rfl:    FLUoxetine (PROZAC) 40 MG capsule, Take 40 mg by mouth at bedtime., Disp: , Rfl:    hydrOXYzine (ATARAX) 25 MG tablet, TAKE 1 TABLET(25 MG) BY MOUTH AT BEDTIME AS NEEDED FOR ITCHING (Patient taking differently: Take 25 mg by mouth at bedtime.), Disp: 30 tablet, Rfl: 2   ibuprofen (ADVIL) 200 MG tablet, Take 200 mg by mouth every 6 (six) hours as needed for moderate pain (pain score 4-6)., Disp: , Rfl:    levothyroxine (SYNTHROID) 137 MCG tablet, Take 137 mcg by mouth daily before breakfast., Disp: , Rfl:    loratadine (CLARITIN) 10 MG tablet, Take 10 mg by mouth daily as needed for allergies., Disp: , Rfl:    mineral oil-hydrophilic petrolatum (AQUAPHOR) ointment, Apply topically as needed for dry skin., Disp: 420 g, Rfl: 0   Multiple Vitamin (MULTIVITAMIN) capsule, Take 2 capsules by mouth daily., Disp: , Rfl:   Review of Symptoms: ROS intake form + for vaginal bleeding/discharge  Physical Exam: General: Well developed, well nourished female in no acute distress. Alert and oriented x 3.  Breathing unlabored. Genitourinary:    Vulva/vagina: Anterior vulvar incision is intact with sutures present. No surrounding erythema. No significant discharge. No fluctuance. Foley catheter is in place connected to leg bag with clear, yellow urine. No significant edema. Biopsy sites are healing.     Urethra: No lesions or masses. Foley in place.   Extremities: No bilateral cyanosis, edema, or clubbing.    Laboratory & Radiologic Studies: A. ANTERIOR VULVA, EXCISION: Invasive and in situ well to moderately differentiated squamous cell carcinoma Tumor measures 2.0 x 1.6 x 0.65 cm and invades to a depth of 6.5 mm (pT1b) Margins free (2.4 mm from deep stromal margin; greater than 10 mm from lateral/side margins) Negative for angiolymphatic invasion One incidental benign lymph node  (0/1)  B. RIGHT INNER VULVA, BIOPSY: Severe keratinizing squamous dysplasia (HSIL, VIN 3) Negative for invasive carcinoma  C. LATERAL VULVA, BIOPSY: Mild keratinizing squamous dysplasia (LSIL, VIN 1)  D. PERINEUM, BIOPSY: Mild squamous dysplasia (LSIL, VIN 1) Acute inflammation with ulceration  E. LEFT VULVA, 1:00, BIOPSY: Benign squamous mucosa with chronic inflammation Negative for dysplasia and carcinoma  F. LEFT VULVA, 5:00, BIOPSY: Benign squamous mucosa Negative for dysplasia and carcinoma  G. PERIANAL, 4:00, BIOPSY: Benign squamous mucosa Negative for dysplasia and carcinoma  Assessment & Plan:  Leslie [name] is a 68 y.o. 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  Patient doing well post-op. Discussed continued expectations. Pathology discussed at recent phone visit with Dr. Pricilla Holm, which shows a small anterior SCC, one biopsy with VIN3, rest with VIN1 or normal.  Per Dr. Winferd Humphrey discussion: Based on size of lesion and DOI, she would benefit from LN evaluation via SLN bx. She is certainly at higher risk of full inguinal LND. Would also recommend consideration for repeat excision given close but negative deep margin and treatment of VIN3 along right inner labia. Procedure has been scheduled at [facility] with Dr. Pricilla Holm.  Questions answered in regards to sentinel lymph node procedure. Plan for follow up on June 02, 2023 in the office for continued post-op follow up and possible consideration of foley removal at that time. Reportable signs and symptoms reviewed.      10 minutes of total time was spent for this patient encounter, including preparation, counseling with the patient and coordination of care, and documentation of the encounter.  Warner Mccreedy, NP [facility] G[facility] Oncology

## 2023-05-25 ENCOUNTER — Telehealth: Payer: Self-pay | Admitting: *Deleted

## 2023-05-25 NOTE — Telephone Encounter (Signed)
 Per request fax records to [facility] [phone]

## 2023-05-31 ENCOUNTER — Encounter: Payer: Self-pay | Admitting: Gynecologic Oncology

## 2023-06-02 ENCOUNTER — Inpatient Hospital Stay (HOSPITAL_BASED_OUTPATIENT_CLINIC_OR_DEPARTMENT_OTHER): Admitting: Gynecologic Oncology

## 2023-06-02 VITALS — BP 120/72 | HR 71 | Temp 97.7°F | Resp 20 | Wt 242.2 lb

## 2023-06-02 DIAGNOSIS — C519 Malignant neoplasm of vulva, unspecified: Secondary | ICD-10-CM

## 2023-06-02 DIAGNOSIS — Z978 Presence of other specified devices: Secondary | ICD-10-CM

## 2023-06-02 DIAGNOSIS — Z9079 Acquired absence of other genital organ(s): Secondary | ICD-10-CM

## 2023-06-02 NOTE — Patient Instructions (Signed)
 Today we removed your catheter. Plan to monitor your urine output and call the office for any issues emptying your bladder.   Your surgery at [facility] is on April 7. You will receive a phone call from [facility] with a preop appointment there at [facility].   Continue with your peri care and call for any issues.   If you start having any urinary symptoms, please call the office to have a urine sample checked.

## 2023-06-06 ENCOUNTER — Telehealth: Payer: Self-pay | Admitting: *Deleted

## 2023-06-06 NOTE — Progress Notes (Signed)
 Gynecologic Oncology Post-Operative Follow Up  06/02/2023  Reason for Visit: post-operative follow-up, vulvar incision check, possible foley removal  Treatment History: Oncology History Overview Note  Presented with vulvar pruritus   Vulvar cancer (HCC)  04/13/2023 Surgery   EUA, vulvar biopsy, pap test FINDINGS:  Erythema in groin folds and along inner labia majora, atrophic vaginal introitus with atrophic urethral meatus, normal appearing cervix, thickened and friable clitoral hood, 1.5cm ulcerated lesion on upper inner right labia  SPECIMENS: left labial biopsy, right labial biopsy, clitoral hood biopsy, cervical pap smear    04/13/2023 Initial Biopsy   A. LABIA, RIGHT INNER, BIOPSY:  - Invasive moderately differentiated squamous cell carcinoma, see  comment  B. CLITORAL, BIOPSY:  - Invasive moderately differentiated squamous cell carcinoma, see  comment  C. 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  Moderate hypermetabolism in the anal region, SUV max 7.7.  No pelvic adenopathy.  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  Comment that there are swirling vessels in the root of the small bowel mesentery likely due to loose mesenteric attachment   On 05/09/2023, she underwent partial modified radical anterior vulvectomy with partial removal of the clitoris, multiple vulvar and peri-anal biopsies with Dr. Eugene Garnet. She had an overnight stay in the hospital and her post-operative course was uneventful. Final pathology returned with: Invasive and in situ well to moderately differentiated squamous cell carcinoma Tumor measures 2.0 x 1.6 x 0.65 cm and invades to a depth of 6.5 mm (pT1b) Margins free (2.4 mm from deep stromal margin;  greater than 10 mm from lateral/side margins) Negative for angiolymphatic invasion One incidental benign lymph node (0/1)  B. RIGHT INNER VULVA, BIOPSY: Severe keratinizing squamous dysplasia (HSIL, VIN 3) Negative for invasive carcinoma  C. LATERAL VULVA, BIOPSY: Mild keratinizing squamous dysplasia (LSIL, VIN 1)  D. PERINEUM, BIOPSY: Mild squamous dysplasia (LSIL, VIN 1) Acute inflammation with ulceration  E. LEFT VULVA, 1:00, BIOPSY: Benign squamous mucosa with chronic inflammation Negative for dysplasia and carcinoma  F. LEFT VULVA, 5:00, BIOPSY: Benign squamous mucosa Negative for dysplasia and carcinoma  G. PERIANAL, 4:00, BIOPSY: Benign squamous mucosa Negative for dysplasia and carcinoma   Interval History: Patient reports continuing to doing well since surgery. Tolerating diet with no nausea or emesis. No concerns voiced from vulvar incision. No fever, chills. Bowels functioning without difficulty. Foley catheter has been draining clear, yellow urine without issues. No concerns voiced.   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   Vulva cancer [facility])     Past Surgical History:  Procedure Laterality Date   BREAST REDUCTION SURGERY  2001   COLONOSCOPY WITH PROPOFOL  06/04/2013   dr Leone Payor   DILATION AND CURETTAGE OF UTERUS     yrs ago   LAPAROSCOPIC GASTRIC BANDING  WITH HIATAL HERNIA REPAIR  03/28/2006   @ [facility] by dr b. Johna Sheriff   LAPAROSCOPIC ROUX-EN-Y GASTRIC BYPASS WITH UPPER ENDOSCOPY AND REMOVAL OF LAP BAND  02/26/2019   @  [facility] by dr j. dasher   LESION REMOVAL N/A 12/30/2022   Procedure: EXCISION BIOPSY PERIANAL LESION;  Surgeon: Romie Levee, MD;  Location: [facility];  Service: General;  Laterality: N/A;   VULVA Ples Specter BIOPSY N/A 04/13/2023   Procedure: VULVAR BIOPSY WITH PAPSMERE;  Surgeon: Lorriane Shire, MD;  Location: [facility];  Service: Gynecology;  Laterality: N/A;   VULVECTOMY N/A 05/09/2023   Procedure: PARTIAL MODIFIED RADICAL VULVECTOMY;  Surgeon: Carver Fila, MD;  Location: [facility];  Service: Gynecology;  Laterality: N/A;   WISDOM TOOTH EXTRACTION      Family History  Problem Relation Age of Onset   Heart attack Mother    Breast cancer Mother    Heart attack Father    Colon cancer Maternal Grandmother    Diabetes Neg Hx    Hyperlipidemia Neg Hx    Hypertension Neg Hx    Sudden death Neg Hx    Prostate cancer Neg Hx    Pancreatic cancer Neg Hx    Ovarian cancer Neg Hx    Endometrial cancer Neg Hx     Social History   Socioeconomic History   Marital status: Single    Spouse name: Not on file   Number of children: Not on file   Years of education: Not on file   Highest education level: Not on file  Occupational History   Not on file  Tobacco Use   Smoking status: Former    Types: Cigarettes   Smokeless tobacco: Never   Tobacco comments:    04-12-2023  quit smoking 2000,  started age 46  Vaping Use   Vaping status: Never Used  Substance and Sexual Activity   Alcohol use: Not Currently    Comment: rare   Drug use: Never   Sexual activity: Not on file  Other Topics Concern   Not on file  Social History Narrative   Not on file   Social Drivers of Health   Financial Resource Strain: Low Risk  (04/07/2023)   Received from [facility]   Overall Financial Resource Strain (CARDIA)    Difficulty of Paying Living Expenses: Not hard at all  Food Insecurity: No Food Insecurity (05/09/2023)   Hunger Vital Sign    Worried About  Running Out of Food in the Last Year: Never true    Ran Out of Food in the Last Year: Never true  Transportation Needs: No Transportation Needs (05/09/2023)   PRAPARE - Administrator, Civil Service (Medical): No    Lack of Transportation (Non-Medical): No  Physical Activity: Insufficiently Active (05/18/2022)   Received from [facility]   Exercise Vital Sign    Days of Exercise per Week: 7 days    Minutes of Exercise per Session: 10 min  Stress: No Stress Concern Present (05/18/2022)   Received from [facility] of Occupational Health - Occupational Stress Questionnaire    Feeling of Stress : Not at all  Social Connections: Patient Declined (05/09/2023)   Social Connection and Isolation Panel [NHANES]    Frequency of Communication with Friends and Family: Patient declined    Frequency of Social Gatherings with Friends and Family: Patient declined    Attends Religious Services: Patient declined  Active Member of Clubs or Organizations: Patient declined    Attends Banker Meetings: Patient declined    Marital Status: Patient declined    Current Medications:  Current Outpatient Medications:    acetaminophen (TYLENOL) 500 MG tablet, Take 500 mg by mouth every 6 (six) hours as needed for moderate pain (pain score 4-6)., Disp: , Rfl:    BIOTIN PO, Take 6,000 mcg by mouth daily., Disp: , Rfl:    Calcium Carb-Cholecalciferol (CALCIUM 500 + D PO), Take 2 tablets by mouth daily., Disp: , Rfl:    Cholecalciferol 50 MCG (2000 UT) TABS, Take 4,000 Units by mouth daily., Disp: , Rfl:    clobetasol ointment (TEMOVATE) 0.05 %, Apply to affected area every night for 4 weeks, then every other day for 4 weeks and then twice a week for 4 weeks or until resolution. (Patient taking differently: Apply 1 Application topically daily.), Disp: 30 g, Rfl: 5   Ferrous Sulfate (IRON PO), Take 26 mg by mouth daily., Disp: , Rfl:     FLUoxetine (PROZAC) 40 MG capsule, Take 40 mg by mouth at bedtime., Disp: , Rfl:    hydrOXYzine (ATARAX) 25 MG tablet, TAKE 1 TABLET(25 MG) BY MOUTH AT BEDTIME AS NEEDED FOR ITCHING (Patient taking differently: Take 25 mg by mouth at bedtime.), Disp: 30 tablet, Rfl: 2   ibuprofen (ADVIL) 200 MG tablet, Take 200 mg by mouth every 6 (six) hours as needed for moderate pain (pain score 4-6)., Disp: , Rfl:    levothyroxine (SYNTHROID) 137 MCG tablet, Take 137 mcg by mouth daily before breakfast., Disp: , Rfl:    loratadine (CLARITIN) 10 MG tablet, Take 10 mg by mouth daily as needed for allergies., Disp: , Rfl:    mineral oil-hydrophilic petrolatum (AQUAPHOR) ointment, Apply topically as needed for dry skin., Disp: 420 g, Rfl: 0   Multiple Vitamin (MULTIVITAMIN) capsule, Take 2 capsules by mouth daily., Disp: , Rfl:   Review of Symptoms: See interval. No new symptoms reported  Physical Exam: General: Well developed, well nourished female in no acute distress. Alert and oriented x 3.  Breathing unlabored. Genitourinary:    Vulva/vagina: Anterior vulvar incision remains intact with sutures present. No surrounding erythema. No significant discharge. No fluctuance. Foley catheter is in place connected to leg bag with darker urine (per pt older urine in the leg bag when connecting it this am that was not emptied). No significant edema. Biopsy sites are healing.     Urethra: No lesions or masses. Foley in place. Urethra is non-obstructed. Foley removed without difficulty and patient tolerated well.    Extremities: No bilateral cyanosis, edema, or clubbing.    Laboratory & Radiologic Studies: A. ANTERIOR VULVA, EXCISION: Invasive and in situ well to moderately differentiated squamous cell carcinoma Tumor measures 2.0 x 1.6 x 0.65 cm and invades to a depth of 6.5 mm (pT1b) Margins free (2.4 mm from deep stromal margin; greater than 10 mm from lateral/side margins) Negative for angiolymphatic  invasion One incidental benign lymph node (0/1)  B. RIGHT INNER VULVA, BIOPSY: Severe keratinizing squamous dysplasia (HSIL, VIN 3) Negative for invasive carcinoma  C. LATERAL VULVA, BIOPSY: Mild keratinizing squamous dysplasia (LSIL, VIN 1)  D. PERINEUM, BIOPSY: Mild squamous dysplasia (LSIL, VIN 1) Acute inflammation with ulceration  E. LEFT VULVA, 1:00, BIOPSY: Benign squamous mucosa with chronic inflammation Negative for dysplasia and carcinoma  F. LEFT VULVA, 5:00, BIOPSY: Benign squamous mucosa Negative for dysplasia and carcinoma  G. PERIANAL, 4:00, BIOPSY: Benign squamous  mucosa Negative for dysplasia and carcinoma  Assessment & Plan: Leslie [name] is a 68 y.o. 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  Patient doing well post-op. Foley catheter removed today without difficulty. Reportable signs and symptoms reviewed. She will be contacted with a preop appointment with Dr. Pricilla Holm at [facility].  Per Dr. Winferd Humphrey discussion: Based on size of lesion and DOI, she would benefit from LN evaluation via SLN bx. She is certainly at higher risk of full inguinal LND. Would also recommend consideration for repeat excision given close but negative deep margin and treatment of VIN3 along right inner labia. Procedure has been scheduled at [facility] with Dr. Pricilla Holm.  10 minutes of total time was spent for this patient encounter, including preparation, counseling with the patient and coordination of care, and documentation of the encounter.  Warner Mccreedy, NP [facility]

## 2023-06-06 NOTE — Telephone Encounter (Signed)
 Attempted to reach patient, left voicemail requesting call back.

## 2023-06-06 NOTE — Telephone Encounter (Signed)
 Spoke with Leslie [name] to check on her urinary status. Pt states she is urinating without difficulties, making full stream, and denies any feeling of obstruction. Glad to hear patient is doing well. Advised to call the office with any concerns or questions. Pt thanked the office for calling.

## 2023-06-24 ENCOUNTER — Encounter: Payer: Medicare Other | Admitting: Gynecologic Oncology

## 2023-07-04 ENCOUNTER — Telehealth: Payer: Self-pay

## 2023-07-04 NOTE — Telephone Encounter (Signed)
 S/P Partial modified radical anterior vulvectomy with partial removal of the clitoris, multiple vulvar and peri-anal biopsies on 2/17 with Dr.Tucker   Leslie [name] called office with concern of swelling on the incision on left side. Reports no oozing, no redness, no tenderness, no pain, no signs of infection. She does not have fever/chills, eating/drinking just fine. No other concerns reported. (Pt declined sending a picture through Mychart at this time)  With no other S&S she will monitor and call with any worsening concerns. Advised to apply ice, and continue to use peri bottle. She voiced an understanding and will call if any further concerns. Post op appointment is 5/25

## 2023-07-04 NOTE — Telephone Encounter (Signed)
 Pt is aware to call [facility](number given) for any questions or concerns regarding recent surgery. She voiced an understanding.

## 2023-07-12 ENCOUNTER — Telehealth: Payer: Self-pay | Admitting: *Deleted

## 2023-07-12 NOTE — Telephone Encounter (Signed)
 Spoke with Ms. Savino and relayed message from Vira Grieves, NP that we are scheduling pt's right inguinal lymph node dissection procedure with Dr. Orvil Bland here at [facility] on 08/03/2023. Maryan Smalling will be calling to further discuss instructions. Pt verbalized understanding and thanked the office for calling.

## 2023-07-12 NOTE — Telephone Encounter (Signed)
-----   Message from Suellyn Emory sent at 07/12/2023  1:01 PM EDT ----- Please let her know we are scheduling her right inguinal lymph node dissection procedure with Dr. Orvil Bland here at [facility] on 08/03/2023. She will get a call from the hospital to further discuss instructions.

## 2023-07-13 ENCOUNTER — Telehealth: Payer: Self-pay | Admitting: *Deleted

## 2023-07-13 NOTE — Telephone Encounter (Signed)
 LMOM for the patient to call the office back. Patient needs to be given the appts for 4/30 and 5/6

## 2023-07-18 ENCOUNTER — Telehealth: Payer: Self-pay

## 2023-07-18 NOTE — Telephone Encounter (Signed)
-----   Message from Suellyn Emory sent at 07/18/2023  9:42 AM EDT ----- Please call the patient and let her know we have a sooner opening for surgery on 5/7. Would she like to move to this date?

## 2023-07-18 NOTE — Telephone Encounter (Signed)
 Pt states she can not move her surgery date to 5/7, her daughter is graduating on 5/9. She was thankful for the call

## 2023-07-20 ENCOUNTER — Encounter: Payer: Self-pay | Admitting: Gynecologic Oncology

## 2023-07-20 ENCOUNTER — Inpatient Hospital Stay: Attending: Gynecologic Oncology | Admitting: Gynecologic Oncology

## 2023-07-20 DIAGNOSIS — Z9889 Other specified postprocedural states: Secondary | ICD-10-CM

## 2023-07-20 DIAGNOSIS — C519 Malignant neoplasm of vulva, unspecified: Secondary | ICD-10-CM

## 2023-07-20 DIAGNOSIS — Z7189 Other specified counseling: Secondary | ICD-10-CM

## 2023-07-20 NOTE — H&P (View-Only) (Signed)
 Gynecologic Oncology Telehealth Note: Gyn-Onc  I connected with Leslie [name] on 07/20/23 at  6:00 PM EDT by telephone and verified that I am speaking with the correct person using two identifiers.  I discussed the limitations, risks, security and privacy concerns of performing an evaluation and management service by telemedicine and the availability of in-person appointments. I also discussed with the patient that there may be a patient responsible charge related to this service. The patient expressed understanding and agreed to proceed.  Other persons participating in the visit and their role in the encounter: none.  Patient's location: home Provider's location: [facility]  Reason for Visit: follow-up, treatment planning  Treatment History: Oncology History Overview Note  Presented with vulvar pruritus   Vulvar cancer (HCC)  04/13/2023 Surgery   EUA, vulvar biopsy, pap test FINDINGS:  Erythema in groin folds and along inner labia majora, atrophic vaginal introitus with atrophic urethral meatus, normal appearing cervix, thickened and friable clitoral hood, 1.5cm ulcerated lesion on upper inner right labia  SPECIMENS: left labial biopsy, right labial biopsy, clitoral hood biopsy, cervical pap smear    04/13/2023 Initial Biopsy   A. LABIA, RIGHT INNER, BIOPSY:  - Invasive moderately differentiated squamous cell carcinoma, see  comment  B. CLITORAL, BIOPSY:  - Invasive moderately differentiated squamous cell carcinoma, see  comment  C. 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  Moderate hypermetabolism in the anal region, SUV max 7.7.  No pelvic adenopathy.  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  Comment that there are swirling vessels in  the root of the small bowel mesentery likely due to loose mesenteric attachment    - 05/09/23- Partial modified radical vulvectomy with final pathology showing moderately differentiated squamous cell carcinoma (2x1.6x0.65, depth 6.74mm) with negative margins (2.1mm from deep stromal margin), and VIN3 consistent with at least stage IB squamous cell carcinoma of vulva Invasive and in situ well to moderately differentiated squamous cell carcinoma Tumor measures 2.0 x 1.6 x 0.65 cm and invades to a depth of 6.5 mm (pT1b) Margins free (2.4 mm from deep stromal margin; greater than 10 mm from lateral/side margins) Negative for angiolymphatic invasion One incidental benign lymph node (0/1)  - 06/27/23 - Vulvar biopsies, laser ablation, bilateral sentinel lymph node mapping and dissection  Well healing vulvectomy bed with small area of granulation tissue superior and anterior to urethra. Intradermal injections of Tc-2m were made surrounding the patient's vulvar lesion bilaterally. Subcutaneous injections of indocyanine green were also made surrounding the patient's vulvar lesion. Successful sentinel lymph node mapping and biopsy of inguinal nodes bilaterally which were noted to be both hot, green and bright. Biopsies taken of granulation tissue and of prior area of vulvar displasia. CO2 laser ablation performed of entire area down to a second surgical layer. Silvadene cream applied. Good hemostasis at end of case.    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) - Size of largest tumor deposit: 6 mm - Extranodal extension: Not identified   C: Supra urethral, biopsy: - Differentiated vulvar intraepithelial neoplasia (dVIN) in a background of granulation  tissue with dense acute  inflammation - No invasive carcinoma identified - See comment   D: Interlabia 10:00, biopsy: - Superficial tangentially sectioned fragment of differentiated vular intraepithelial neoplasia (dVIN) - No definite invasive carcinoma identified - See comment   This electronic signature is attestation that the pathologist personally reviewed the submitted material(s) and the final diagnosis reflects that evaluation.  Electronically signed by Audree Leas, MD on 07/05/2023 at 1546 EDT  Diagnosis Comment    Immunohistochemical stains performed on parts C and D show that the dysplastic squamous epithelium in each specimen is negative for p16, supporting an HPV-independent process. P53 is diffusely negative in each specimen, consistent with a null phenotype and a diagnosis of dVIN. Ki-67 stains show increased basally oriented staining within the dysplastic epithelium, further supporting the diagnosis. This case was reviewed at [facility] conference for quality assurance purposes.    Interval History: Doing well.  Has noticed an area along her left thigh where the skin feels thicker.  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   Vulva cancer [facility])     Past Surgical History:  Procedure Laterality Date   BREAST REDUCTION SURGERY  2001   COLONOSCOPY WITH PROPOFOL   06/04/2013   dr Willy Harvest   DILATION AND CURETTAGE OF UTERUS     yrs ago   LAPAROSCOPIC GASTRIC BANDING WITH HIATAL HERNIA REPAIR  03/28/2006   @ [facility] by dr b. Alray Askew   LAPAROSCOPIC  ROUX-EN-Y GASTRIC BYPASS WITH UPPER ENDOSCOPY AND REMOVAL OF LAP BAND  02/26/2019   @ [facility] by dr j. dasher   LESION REMOVAL N/A 12/30/2022   Procedure: EXCISION BIOPSY PERIANAL LESION;  Surgeon: Joyce Nixon, MD;  Location: [facility];  Service: General;  Laterality: N/A;   VULVA Asher Blade BIOPSY N/A 04/13/2023   Procedure: VULVAR BIOPSY WITH PAPSMERE;  Surgeon: Kiki Pelton, MD;  Location: [facility];  Service: Gynecology;  Laterality: N/A;   VULVECTOMY N/A 05/09/2023   Procedure: PARTIAL MODIFIED RADICAL VULVECTOMY;  Surgeon: Suzi Essex, MD;  Location: [facility];  Service: Gynecology;  Laterality: N/A;   WISDOM TOOTH EXTRACTION      Family History  Problem Relation Age of Onset   Heart attack Mother    Breast cancer Mother    Heart attack Father    Colon cancer Maternal Grandmother    Diabetes Neg Hx    Hyperlipidemia Neg Hx    Hypertension Neg Hx    Sudden death Neg Hx    Prostate cancer Neg Hx    Pancreatic cancer Neg Hx    Ovarian cancer Neg Hx    Endometrial cancer Neg Hx     Social History   Socioeconomic History   Marital status: Single    Spouse name: Not on file   Number of children: Not on file   Years of education: Not on file   Highest education level: Not on file  Occupational History   Not on file  Tobacco Use   Smoking status:  Former    Types: Cigarettes   Smokeless tobacco: Never   Tobacco comments:    04-12-2023  quit smoking 2000,  started age 57  Vaping Use   Vaping status: Never Used  Substance and Sexual Activity   Alcohol use: Not Currently    Comment: rare   Drug use: Never   Sexual activity: Not on file  Other Topics Concern   Not on file  Social History Narrative   Not on file   Social Drivers of Health   Financial Resource Strain: Low Risk  (04/07/2023)   Received from [facility]   Overall Financial Resource Strain (CARDIA)    Difficulty of Paying Living Expenses: Not hard at all   Food Insecurity: No Food Insecurity (06/21/2023)   [facility]   Hunger Vital Sign    Worried About Running Out of Food in the Last Year: Never true    Ran Out of Food in the Last Year: Never true  Transportation Needs: No Transportation Needs (06/21/2023)   Received from [facility]   PRAPARE - Transportation    Lack of Transportation (Medical): No    Lack of Transportation (Non-Medical): No  Physical Activity: Insufficiently Active (05/18/2022)   Received from [facility]   Exercise Vital Sign    Days of Exercise per Week: 7 days    Minutes of Exercise per Session: 10 min  Stress: No Stress Concern Present (05/18/2022)   Received from [facility], [facility] of Occupational Health - Occupational Stress Questionnaire    Feeling of Stress : Not at all  Social Connections: Patient Declined (05/09/2023)   Social Connection and Isolation Panel [NHANES]    Frequency of Communication with Friends and Family: Patient declined    Frequency of Social Gatherings with Friends and Family: Patient declined    Attends Religious Services: Patient declined    Database administrator or Organizations: Patient declined    Attends Banker Meetings: Patient declined    Marital Status: Patient declined    Current Medications:  Current Outpatient Medications:    acetaminophen  (TYLENOL ) 500 MG tablet, Take 500 mg by mouth every 6 (six) hours as needed for moderate pain (pain score 4-6)., Disp: , Rfl:    BIOTIN PO, Take 6,000 mcg by mouth daily., Disp: , Rfl:    Calcium  Carb-Cholecalciferol (CALCIUM  500 + D PO), Take 2 tablets by mouth daily., Disp: , Rfl:    Cholecalciferol 50 MCG (2000 UT) TABS, Take 4,000 Units by mouth daily., Disp: , Rfl:    clobetasol  ointment (TEMOVATE ) 0.05 %, Apply to affected area every night for 4 weeks, then every other day for 4 weeks and then twice a week for 4 weeks or until resolution. (Patient taking  differently: Apply 1 Application topically daily.), Disp: 30 g, Rfl: 5   Ferrous Sulfate (IRON PO), Take 26 mg by mouth daily., Disp: , Rfl:    FLUoxetine  (PROZAC ) 40 MG capsule, Take 40 mg by mouth at bedtime., Disp: , Rfl:    hydrOXYzine  (ATARAX ) 25 MG tablet, TAKE 1 TABLET(25 MG) BY MOUTH AT BEDTIME AS NEEDED FOR ITCHING (Patient taking differently: Take 25 mg by mouth at bedtime as needed for itching.), Disp: 30 tablet, Rfl: 2   levothyroxine  (SYNTHROID ) 137 MCG tablet, Take 137 mcg by mouth daily before breakfast., Disp: , Rfl:    loratadine (CLARITIN) 10 MG tablet, Take 10 mg by mouth daily as needed for allergies.,  Disp: , Rfl:    mineral oil-hydrophilic petrolatum (AQUAPHOR) ointment, Apply topically as needed for dry skin., Disp: 420 g, Rfl: 0   Multiple Vitamin (MULTIVITAMIN) capsule, Take 2 capsules by mouth daily., Disp: , Rfl:   Review of Symptoms: Pertinent positives as per HPI.  Physical Exam: Deferred given limitations of phone visit.  Laboratory & Radiologic Studies: None new  Assessment & Plan: Celisa Ortloff is a 68 y.o. 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 PDL1 CPS 70%   Previously discussed decision about right inguinofemoral lymphadenectomy in the setting of sentinel lymph node with macrometastasis and despite her initial hesitation and reservations about lymphedema and lymph node removal.  She unfortunately was not a candidate for GROINSS-VIII.  We discussed the procedure in detail and reviewed the risks which include but are not limited to bleeding, need for blood transfusion, infection, damage to surrounding structures requiring repair, VTE, postoperative lung infection, stroke, heart attack, and rarely death.  Postoperative recovery discussed.  All questions  answered.  If she requires radiation, she would like to do that in [place].This will ultimately be decided based on her pathology from right IFL.  I discussed the assessment and treatment plan with the patient. The patient was provided with an opportunity to ask questions and all were answered. The patient agreed with the plan and demonstrated an understanding of the instructions.   The patient was advised to call back or see an in-person evaluation if the symptoms worsen or if the condition fails to improve as anticipated.   23 minutes of total time was spent for this patient encounter, including preparation, phone counseling with the patient and coordination of care, and documentation of the encounter.   Wiley Hanger, MD  Division of Gynecologic Oncology  Department of Obstetrics and Gynecology  [facility]

## 2023-07-20 NOTE — Progress Notes (Signed)
 Gynecologic Oncology Telehealth Note: Gyn-Onc  I connected with Leslie [name] on 07/20/23 at  6:00 PM EDT by telephone and verified that I am speaking with the correct person using two identifiers.  I discussed the limitations, risks, security and privacy concerns of performing an evaluation and management service by telemedicine and the availability of in-person appointments. I also discussed with the patient that there may be a patient responsible charge related to this service. The patient expressed understanding and agreed to proceed.  Other persons participating in the visit and their role in the encounter: none.  Patient's location: home Provider's location: [facility]  Reason for Visit: follow-up, treatment planning  Treatment History: Oncology History Overview Note  Presented with vulvar pruritus   Vulvar cancer (HCC)  04/13/2023 Surgery   EUA, vulvar biopsy, pap test FINDINGS:  Erythema in groin folds and along inner labia majora, atrophic vaginal introitus with atrophic urethral meatus, normal appearing cervix, thickened and friable clitoral hood, 1.5cm ulcerated lesion on upper inner right labia  SPECIMENS: left labial biopsy, right labial biopsy, clitoral hood biopsy, cervical pap smear    04/13/2023 Initial Biopsy   A. LABIA, RIGHT INNER, BIOPSY:  - Invasive moderately differentiated squamous cell carcinoma, see  comment  B. CLITORAL, BIOPSY:  - Invasive moderately differentiated squamous cell carcinoma, see  comment  C. 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  Moderate hypermetabolism in the anal region, SUV max 7.7.  No pelvic adenopathy.  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  Comment that there are swirling vessels in  the root of the small bowel mesentery likely due to loose mesenteric attachment    - 05/09/23- Partial modified radical vulvectomy with final pathology showing moderately differentiated squamous cell carcinoma (2x1.6x0.65, depth 6.74mm) with negative margins (2.1mm from deep stromal margin), and VIN3 consistent with at least stage IB squamous cell carcinoma of vulva Invasive and in situ well to moderately differentiated squamous cell carcinoma Tumor measures 2.0 x 1.6 x 0.65 cm and invades to a depth of 6.5 mm (pT1b) Margins free (2.4 mm from deep stromal margin; greater than 10 mm from lateral/side margins) Negative for angiolymphatic invasion One incidental benign lymph node (0/1)  - 06/27/23 - Vulvar biopsies, laser ablation, bilateral sentinel lymph node mapping and dissection  Well healing vulvectomy bed with small area of granulation tissue superior and anterior to urethra. Intradermal injections of Tc-2m were made surrounding the patient's vulvar lesion bilaterally. Subcutaneous injections of indocyanine green were also made surrounding the patient's vulvar lesion. Successful sentinel lymph node mapping and biopsy of inguinal nodes bilaterally which were noted to be both hot, green and bright. Biopsies taken of granulation tissue and of prior area of vulvar displasia. CO2 laser ablation performed of entire area down to a second surgical layer. Silvadene cream applied. Good hemostasis at end of case.    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) - Size of largest tumor deposit: 6 mm - Extranodal extension: Not identified   C: Supra urethral, biopsy: - Differentiated vulvar intraepithelial neoplasia (dVIN) in a background of granulation  tissue with dense acute  inflammation - No invasive carcinoma identified - See comment   D: Interlabia 10:00, biopsy: - Superficial tangentially sectioned fragment of differentiated vular intraepithelial neoplasia (dVIN) - No definite invasive carcinoma identified - See comment   This electronic signature is attestation that the pathologist personally reviewed the submitted material(s) and the final diagnosis reflects that evaluation.  Electronically signed by Audree Leas, MD on 07/05/2023 at 1546 EDT  Diagnosis Comment    Immunohistochemical stains performed on parts C and D show that the dysplastic squamous epithelium in each specimen is negative for p16, supporting an HPV-independent process. P53 is diffusely negative in each specimen, consistent with a null phenotype and a diagnosis of dVIN. Ki-67 stains show increased basally oriented staining within the dysplastic epithelium, further supporting the diagnosis. This case was reviewed at [facility] conference for quality assurance purposes.    Interval History: Doing well.  Has noticed an area along her left thigh where the skin feels thicker.  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   Vulva cancer [facility])     Past Surgical History:  Procedure Laterality Date   BREAST REDUCTION SURGERY  2001   COLONOSCOPY WITH PROPOFOL   06/04/2013   dr Willy Harvest   DILATION AND CURETTAGE OF UTERUS     yrs ago   LAPAROSCOPIC GASTRIC BANDING WITH HIATAL HERNIA REPAIR  03/28/2006   @ [facility] by dr b. Alray Askew   LAPAROSCOPIC  ROUX-EN-Y GASTRIC BYPASS WITH UPPER ENDOSCOPY AND REMOVAL OF LAP BAND  02/26/2019   @ [facility] by dr j. dasher   LESION REMOVAL N/A 12/30/2022   Procedure: EXCISION BIOPSY PERIANAL LESION;  Surgeon: Joyce Nixon, MD;  Location: [facility];  Service: General;  Laterality: N/A;   VULVA Asher Blade BIOPSY N/A 04/13/2023   Procedure: VULVAR BIOPSY WITH PAPSMERE;  Surgeon: Kiki Pelton, MD;  Location: [facility];  Service: Gynecology;  Laterality: N/A;   VULVECTOMY N/A 05/09/2023   Procedure: PARTIAL MODIFIED RADICAL VULVECTOMY;  Surgeon: Suzi Essex, MD;  Location: [facility];  Service: Gynecology;  Laterality: N/A;   WISDOM TOOTH EXTRACTION      Family History  Problem Relation Age of Onset   Heart attack Mother    Breast cancer Mother    Heart attack Father    Colon cancer Maternal Grandmother    Diabetes Neg Hx    Hyperlipidemia Neg Hx    Hypertension Neg Hx    Sudden death Neg Hx    Prostate cancer Neg Hx    Pancreatic cancer Neg Hx    Ovarian cancer Neg Hx    Endometrial cancer Neg Hx     Social History   Socioeconomic History   Marital status: Single    Spouse name: Not on file   Number of children: Not on file   Years of education: Not on file   Highest education level: Not on file  Occupational History   Not on file  Tobacco Use   Smoking status:  Former    Types: Cigarettes   Smokeless tobacco: Never   Tobacco comments:    04-12-2023  quit smoking 2000,  started age 57  Vaping Use   Vaping status: Never Used  Substance and Sexual Activity   Alcohol use: Not Currently    Comment: rare   Drug use: Never   Sexual activity: Not on file  Other Topics Concern   Not on file  Social History Narrative   Not on file   Social Drivers of Health   Financial Resource Strain: Low Risk  (04/07/2023)   Received from [facility]   Overall Financial Resource Strain (CARDIA)    Difficulty of Paying Living Expenses: Not hard at all   Food Insecurity: No Food Insecurity (06/21/2023)   [facility]   Hunger Vital Sign    Worried About Running Out of Food in the Last Year: Never true    Ran Out of Food in the Last Year: Never true  Transportation Needs: No Transportation Needs (06/21/2023)   Received from [facility]   PRAPARE - Transportation    Lack of Transportation (Medical): No    Lack of Transportation (Non-Medical): No  Physical Activity: Insufficiently Active (05/18/2022)   Received from [facility]   Exercise Vital Sign    Days of Exercise per Week: 7 days    Minutes of Exercise per Session: 10 min  Stress: No Stress Concern Present (05/18/2022)   Received from [facility], [facility] of Occupational Health - Occupational Stress Questionnaire    Feeling of Stress : Not at all  Social Connections: Patient Declined (05/09/2023)   Social Connection and Isolation Panel [NHANES]    Frequency of Communication with Friends and Family: Patient declined    Frequency of Social Gatherings with Friends and Family: Patient declined    Attends Religious Services: Patient declined    Database administrator or Organizations: Patient declined    Attends Banker Meetings: Patient declined    Marital Status: Patient declined    Current Medications:  Current Outpatient Medications:    acetaminophen  (TYLENOL ) 500 MG tablet, Take 500 mg by mouth every 6 (six) hours as needed for moderate pain (pain score 4-6)., Disp: , Rfl:    BIOTIN PO, Take 6,000 mcg by mouth daily., Disp: , Rfl:    Calcium  Carb-Cholecalciferol (CALCIUM  500 + D PO), Take 2 tablets by mouth daily., Disp: , Rfl:    Cholecalciferol 50 MCG (2000 UT) TABS, Take 4,000 Units by mouth daily., Disp: , Rfl:    clobetasol  ointment (TEMOVATE ) 0.05 %, Apply to affected area every night for 4 weeks, then every other day for 4 weeks and then twice a week for 4 weeks or until resolution. (Patient taking  differently: Apply 1 Application topically daily.), Disp: 30 g, Rfl: 5   Ferrous Sulfate (IRON PO), Take 26 mg by mouth daily., Disp: , Rfl:    FLUoxetine  (PROZAC ) 40 MG capsule, Take 40 mg by mouth at bedtime., Disp: , Rfl:    hydrOXYzine  (ATARAX ) 25 MG tablet, TAKE 1 TABLET(25 MG) BY MOUTH AT BEDTIME AS NEEDED FOR ITCHING (Patient taking differently: Take 25 mg by mouth at bedtime as needed for itching.), Disp: 30 tablet, Rfl: 2   levothyroxine  (SYNTHROID ) 137 MCG tablet, Take 137 mcg by mouth daily before breakfast., Disp: , Rfl:    loratadine (CLARITIN) 10 MG tablet, Take 10 mg by mouth daily as needed for allergies.,  Disp: , Rfl:    mineral oil-hydrophilic petrolatum (AQUAPHOR) ointment, Apply topically as needed for dry skin., Disp: 420 g, Rfl: 0   Multiple Vitamin (MULTIVITAMIN) capsule, Take 2 capsules by mouth daily., Disp: , Rfl:   Review of Symptoms: Pertinent positives as per HPI.  Physical Exam: Deferred given limitations of phone visit.  Laboratory & Radiologic Studies: None new  Assessment & Plan: Leslie [name] is a 68 y.o. 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 PDL1 CPS 70%   Previously discussed decision about right inguinofemoral lymphadenectomy in the setting of sentinel lymph node with macrometastasis and despite her initial hesitation and reservations about lymphedema and lymph node removal.  She unfortunately was not a candidate for GROINSS-VIII.  We discussed the procedure in detail and reviewed the risks which include but are not limited to bleeding, need for blood transfusion, infection, damage to surrounding structures requiring repair, VTE, postoperative lung infection, stroke, heart attack, and rarely death.  Postoperative recovery discussed.  All questions  answered.  If she requires radiation, she would like to do that in [place].This will ultimately be decided based on her pathology from right IFL.  I discussed the assessment and treatment plan with the patient. The patient was provided with an opportunity to ask questions and all were answered. The patient agreed with the plan and demonstrated an understanding of the instructions.   The patient was advised to call back or see an in-person evaluation if the symptoms worsen or if the condition fails to improve as anticipated.   23 minutes of total time was spent for this patient encounter, including preparation, phone counseling with the patient and coordination of care, and documentation of the encounter.   Wiley Hanger, MD  Division of Gynecologic Oncology  Department of Obstetrics and Gynecology  [facility]

## 2023-07-25 NOTE — Patient Instructions (Addendum)
 SURGICAL WAITING ROOM VISITATION  Patients having surgery or a procedure may have no more than 2 support people in the waiting area - these visitors may rotate.    Children under the age of 54 must have an adult with them who is not the patient.  Due to an increase in RSV and influenza rates and associated hospitalizations, children ages 71 and under may not visit patients in [facility] hospitals.  Visitors with respiratory illnesses are discouraged from visiting and should remain at home.  If the patient needs to stay at the hospital during part of their recovery, the visitor guidelines for inpatient rooms apply. Pre-op nurse will coordinate an appropriate time for 1 support person to accompany patient in pre-op.  This support person may not rotate.    Please refer to the [facility] website for the visitor guidelines for Inpatients (after your surgery is over and you are in a regular room).       Your procedure is scheduled on: 08/03/23   Report to [facility] Main Entrance    Report to admitting at  1:30 PM   Call this number if you have problems the morning of surgery [phone]   Do not eat food :After Midnight.   After Midnight you may have the following liquids until 12:45 PM DAY OF SURGERY  Water Non-Citrus Juices (without pulp, NO RED-Apple, White grape, White cranberry) Black Coffee (NO MILK/CREAM OR CREAMERS, sugar ok)  Clear Tea (NO MILK/CREAM OR CREAMERS, sugar ok) regular and decaf                             Plain Jell-O (NO RED)                                           Fruit ices (not with fruit pulp, NO RED)                                     Popsicles (NO RED)                                                               Sports drinks like Gatorade (NO RED)                  Oral Hygiene is also important to reduce your risk of infection.                                    Remember - BRUSH YOUR TEETH THE MORNING OF SURGERY WITH YOUR REGULAR  TOOTHPASTE  DENTURES WILL BE REMOVED PRIOR TO SURGERY PLEASE DO NOT APPLY "Poly grip" OR ADHESIVES!!!   Stop all vitamins and herbal supplements 7 days before surgery.   Take these medicines the morning of surgery with A SIP OF WATER: Prozac , Synthroid , Loratadine(claritin), Tylenol  if needed.             You may not have any metal on your body including hair pins, jewelry,  and body piercing             Do not wear make-up, lotions, powders, perfumes/cologne, or deodorant  Do not wear nail polish including gel and S&S, artificial/acrylic nails, or any other type of covering on natural nails including finger and toenails. If you have artificial nails, gel coating, etc. that needs to be removed by a nail salon please have this removed prior to surgery or surgery may need to be canceled/ delayed if the surgeon/ anesthesia feels like they are unable to be safely monitored.   Do not shave  48 hours prior to surgery.    Do not bring valuables to the hospital. [facility] IS NOT             RESPONSIBLE   FOR VALUABLES.   Contacts, glasses, dentures or bridgework may not be worn into surgery.   Bring small overnight bag day of surgery.   DO NOT BRING YOUR HOME MEDICATIONS TO THE HOSPITAL. PHARMACY WILL DISPENSE MEDICATIONS LISTED ON YOUR MEDICATION LIST TO YOU DURING YOUR ADMISSION IN THE HOSPITAL!    Patients discharged on the day of surgery will not be allowed to drive home.  Someone NEEDS to stay with you for the first 24 hours after anesthesia.   Special Instructions: Bring a copy of your healthcare power of attorney and living will documents the day of surgery if you haven't scanned them before.              Please read over the following fact sheets you were given: IF YOU HAVE QUESTIONS ABOUT YOUR PRE-OP INSTRUCTIONS PLEASE CALL [phone] Am[facility] Bales   If you received a COVID test during your pre-op visit  it is requested that you wear a mask when out in public, stay away from anyone  that may not be feeling well and notify your surgeon if you develop symptoms. If you test positive for Covid or have been in contact with anyone that has tested positive in the last 10 days please notify you surgeon.    [facility] - Preparing for Surgery Before surgery, you can play an important role.  Because skin is not sterile, your skin needs to be as free of germs as possible.  You can reduce the number of germs on your skin by washing with CHG (chlorahexidine gluconate) soap before surgery.  CHG is an antiseptic cleaner which kills germs and bonds with the skin to continue killing germs even after washing. Please DO NOT use if you have an allergy to CHG or antibacterial soaps.  If your skin becomes reddened/irritated stop using the CHG and inform your nurse when you arrive at Short Stay. Do not shave (including legs and underarms) for at least 48 hours prior to the first CHG shower.  You may shave your face/neck.  Please follow these instructions carefully:  1.  Shower with CHG Soap the night before surgery and the  morning of surgery.  2.  If you choose to wash your hair, wash your hair first as usual with your normal  shampoo.  3.  After you shampoo, rinse your hair and body thoroughly to remove the shampoo.                             4.  Use CHG as you would any other liquid soap.  You can apply chg directly to the skin and wash.  Gently with a scrungie or clean washcloth.  5.  Apply the CHG Soap to your body ONLY FROM THE NECK DOWN.   Do   not use on face/ open                           Wound or open sores. Avoid contact with eyes, ears mouth and   genitals (private parts).                       Wash face,  Genitals (private parts) with your normal soap.             6.  Wash thoroughly, paying special attention to the area where your    surgery  will be performed.  7.  Thoroughly rinse your body with warm water from the neck down.  8.  DO NOT shower/wash with your normal soap after using  and rinsing off the CHG Soap.                9.  Pat yourself dry with a clean towel.            10.  Wear clean pajamas.            11.  Place clean sheets on your bed the night of your first shower and do not  sleep with pets. Day of Surgery : Do not apply any lotions/deodorants the morning of surgery.  Please wear clean clothes to the hospital/surgery center.  FAILURE TO FOLLOW THESE INSTRUCTIONS MAY RESULT IN THE CANCELLATION OF YOUR SURGERY   ________________________________________________________________________ WHAT IS A BLOOD TRANSFUSION? Blood Transfusion Information  A transfusion is the replacement of blood or some of its parts. Blood is made up of multiple cells which provide different functions. Red blood cells carry oxygen and are used for blood loss replacement. White blood cells fight against infection. Platelets control bleeding. Plasma helps clot blood. Other blood products are available for specialized needs, such as hemophilia or other clotting disorders. BEFORE THE TRANSFUSION  Who gives blood for transfusions?  Healthy volunteers who are fully evaluated to make sure their blood is safe. This is blood bank blood. Transfusion therapy is the safest it has ever been in the practice of medicine. Before blood is taken from a donor, a complete history is taken to make sure that person has no history of diseases nor engages in risky social behavior (examples are intravenous drug use or sexual activity with multiple partners). The donor's travel history is screened to minimize risk of transmitting infections, such as malaria. The donated blood is tested for signs of infectious diseases, such as HIV and hepatitis. The blood is then tested to be sure it is compatible with you in order to minimize the chance of a transfusion reaction. If you or a relative donates blood, this is often done in anticipation of surgery and is not appropriate for emergency situations. It takes many days  to process the donated blood. RISKS AND COMPLICATIONS Although transfusion therapy is very safe and saves many lives, the main dangers of transfusion include:  Getting an infectious disease. Developing a transfusion reaction. This is an allergic reaction to something in the blood you were given. Every precaution is taken to prevent this. The decision to have a blood transfusion has been considered carefully by your caregiver before blood is given. Blood is not given unless the benefits outweigh the risks. AFTER THE TRANSFUSION Right after receiving a blood transfusion, you will usually  feel much better and more energetic. This is especially true if your red blood cells have gotten low (anemic). The transfusion raises the level of the red blood cells which carry oxygen, and this usually causes an energy increase. The nurse administering the transfusion will monitor you carefully for complications. HOME CARE INSTRUCTIONS  No special instructions are needed after a transfusion. You may find your energy is better. Speak with your caregiver about any limitations on activity for underlying diseases you may have. SEEK MEDICAL CARE IF:  Your condition is not improving after your transfusion. You develop redness or irritation at the intravenous (IV) site. SEEK IMMEDIATE MEDICAL CARE IF:  Any of the following symptoms occur over the next 12 hours: Shaking chills. You have a temperature by mouth above 102 F (38.9 C), not controlled by medicine. Chest, back, or muscle pain. People around you feel you are not acting correctly or are confused. Shortness of breath or difficulty breathing. Dizziness and fainting. You get a rash or develop hives. You have a decrease in urine output. Your urine turns a dark color or changes to pink, red, or brown. Any of the following symptoms occur over the next 10 days: You have a temperature by mouth above 102 F (38.9 C), not controlled by medicine. Shortness of  breath. Weakness after normal activity. The white part of the eye turns yellow (jaundice). You have a decrease in the amount of urine or are urinating less often. Your urine turns a dark color or changes to pink, red, or brown. Document Released: 03/05/2000 Document Revised: 05/31/2011 Document Reviewed: 10/23/2007 [facility] Patient Information 2014 [place], [place].

## 2023-07-25 NOTE — Progress Notes (Unsigned)
 Patient here for a pre-operative appointment prior to her scheduled surgery on 08/03/2023. She is scheduled for right inguinal lymphadenectomy.  She has her pre-admission testing appointment this am at [facility].  The surgery was discussed in detail.  See after visit summary for additional details. Visual aids used to discuss items related to surgery.   Discussed post-op pain management in detail including the aspects of the enhanced recovery pathway.  Advised her that a new prescription would be sent in for *** and it is only to be used for after her upcoming surgery.  We discussed the use of tylenol  post-op and to monitor for a maximum of 4,000 mg in a 24 hour period.  Also prescribed sennakot to be used after surgery and to hold if having loose stools.  Discussed bowel regimen in detail.     Discussed the use of SCDs and measures to take at home to prevent DVT including frequent mobility.  Reportable signs and symptoms of DVT discussed. Post-operative instructions discussed and expectations for after surgery. Incisional care discussed as well including reportable signs and symptoms including erythema, drainage, wound separation.     10 minutes spent preparing information and with the patient.  Verbalizing understanding of material discussed. No needs or concerns voiced at the end of the visit.   Advised patient to call for any needs.  Advised that her post-operative medications had been prescribed and could be picked up at any time.    This appointment is included in the global surgical bundle as pre-operative teaching and has no charge.

## 2023-07-25 NOTE — Progress Notes (Signed)
 COVID Vaccine received:  []  No [x]  Yes Date of any COVID positive Test in last 90 days: no PCP - Aldine Humphreys PA Cardiologist - n/a  Chest x-ray -  EKG -   Stress Test -  ECHO -  Cardiac Cath -   Bowel Prep - [x]  No  []   Yes ______  Pacemaker / ICD device [x]  No []  Yes   Spinal Cord Stimulator:[x]  No []  Yes       History of Sleep Apnea? [x]  No []  Yes   CPAP used?- [x]  No []  Yes    Does the patient monitor blood sugar?          [x]  No []  Yes  []  N/A  Patient has: [x]  NO Hx DM   []  Pre-DM                 []  DM1  []   DM2 Does patient have a [employer] or Dexacom? []  No []  Yes   Fasting Blood Sugar Ranges-  Checks Blood Sugar _____ times a day  GLP1 agonist / usual dose - no GLP1 instructions:  SGLT-2 inhibitors / usual dose - no SGLT-2 instructions:   Blood Thinner / Instructions:no Aspirin  Instructions:no  Comments:   Activity level: Patient is able  to climb a flight of stairs without difficulty; [x]  No CP  [x]  No SOB,    Patient can perform ADLs without assistance.   Anesthesia review:   Patient denies shortness of breath, fever, cough and chest pain at PAT appointment.  Patient verbalized understanding and agreement to the Pre-Surgical Instructions that were given to them at this PAT appointment. Patient was also educated of the need to review these PAT instructions again prior to his/her surgery.I reviewed the appropriate phone numbers to call if they have any and questions or concerns.

## 2023-07-25 NOTE — Patient Instructions (Signed)
 Preparing for your Surgery  Plan for surgery on Aug 03, 2023 with Dr. Wiley Hanger at [facility]. You will be scheduled for right inguinal lymphadenectomy with JP drain placement.   We will plan for an overnight stay in the hospital with discharge home the next morning if you are doing well.   Pre-operative Testing -(Done, 5/6) You will receive a phone call from presurgical testing at [facility] to arrange for a pre-operative appointment and lab work.  -Bring your insurance card, copy of an advanced directive if applicable, medication list  -At that visit, you will be asked to sign a consent for a possible blood transfusion in case a transfusion becomes necessary during surgery.  The need for a blood transfusion is rare but having consent is a necessary part of your care.     -You should not be taking blood thinners or aspirin  at least ten days prior to surgery unless instructed by your surgeon.  -Do not take supplements such as fish oil (omega 3), red yeast rice, turmeric before your surgery. STOP TAKING AT LEAST 10 DAYS BEFORE SURGERY. You want to avoid medications with aspirin  in them including headache powders such as BC or Goody's), Excedrin migraine.  Day Before Surgery at Home -You will be advised you can have clear liquids up until 3 hours before your surgery.    Your role in recovery Your role is to become active as soon as directed by your doctor, while still giving yourself time to heal.  Rest when you feel tired. You will be asked to do the following in order to speed your recovery:  - Cough and breathe deeply. This helps to clear and expand your lungs and can prevent pneumonia after surgery.  - STAY ACTIVE WHEN YOU GET HOME. Do mild physical activity. Walking or moving your legs help your circulation and body functions return to normal. Do not try to get up or walk alone the first time after surgery.   -If you develop swelling on one leg or the other,  pain in the back of your leg, redness/warmth in one of your legs, please call the office or go to the Emergency Room to have a doppler to rule out a blood clot. For shortness of breath, chest pain-seek care in the Emergency Room as soon as possible. - Actively manage your pain. Managing your pain lets you move in comfort. We will ask you to rate your pain on a scale of zero to 10. It is your responsibility to tell your doctor or nurse where and how much you hurt so your pain can be treated.  Special Considerations -If you are diabetic, you may be placed on insulin after surgery to have closer control over your blood sugars to promote healing and recovery.  This does not mean that you will be discharged on insulin.  If applicable, your oral antidiabetics will be resumed when you are tolerating a solid diet.  -Your final pathology results from surgery should be available around one week after surgery and the results will be relayed to you when available.  -Dr. Abdul Hodgkin is the surgeon that assists your GYN Oncologist with surgery.  If you end up staying the night, the next day after your surgery you will either see Dr. Orvil Bland, Dr. Daisey Dryer, or Dr. Abdul Hodgkin.  -FMLA forms can be faxed to [phone] and please allow 5-7 business days for completion.  Pain Management After Surgery -You will be prescribed your pain medication  and bowel regimen medications before surgery so that you can have these available when you are discharged from the hospital. The pain medication is for use ONLY AFTER surgery and a new prescription will not be given.   -Make sure that you have Tylenol  and Ibuprofen IF YOU ARE ABLE TO TAKE THESE MEDICATIONS at home to use on a regular basis after surgery for pain control. We recommend alternating the medications every hour to six hours since they work differently and are processed in the body differently for pain relief.  -Review the attached handout on narcotic use  and their risks and side effects.   Bowel Regimen -You will be prescribed Sennakot-S to take nightly to prevent constipation especially if you are taking the narcotic pain medication intermittently.  It is important to prevent constipation and drink adequate amounts of liquids. You can stop taking this medication when you are not taking pain medication and you are back on your normal bowel routine.  Risks of Surgery Risks of surgery are low but include bleeding, infection, damage to surrounding structures, re-operation, blood clots, and very rarely death.   Blood Transfusion Information (For the consent to be signed before surgery)  We will be checking your blood type before surgery so in case of emergencies, we will know what type of blood you would need.                                            WHAT IS A BLOOD TRANSFUSION?  A transfusion is the replacement of blood or some of its parts. Blood is made up of multiple cells which provide different functions. Red blood cells carry oxygen and are used for blood loss replacement. White blood cells fight against infection. Platelets control bleeding. Plasma helps clot blood. Other blood products are available for specialized needs, such as hemophilia or other clotting disorders. BEFORE THE TRANSFUSION  Who gives blood for transfusions?  You may be able to donate blood to be used at a later date on yourself (autologous donation). Relatives can be asked to donate blood. This is generally not any safer than if you have received blood from a stranger. The same precautions are taken to ensure safety when a relative's blood is donated. Healthy volunteers who are fully evaluated to make sure their blood is safe. This is blood bank blood. Transfusion therapy is the safest it has ever been in the practice of medicine. Before blood is taken from a donor, a complete history is taken to make sure that person has no history of diseases nor engages in risky  social behavior (examples are intravenous drug use or sexual activity with multiple partners). The donor's travel history is screened to minimize risk of transmitting infections, such as malaria. The donated blood is tested for signs of infectious diseases, such as HIV and hepatitis. The blood is then tested to be sure it is compatible with you in order to minimize the chance of a transfusion reaction. If you or a relative donates blood, this is often done in anticipation of surgery and is not appropriate for emergency situations. It takes many days to process the donated blood. RISKS AND COMPLICATIONS Although transfusion therapy is very safe and saves many lives, the main dangers of transfusion include:  Getting an infectious disease. Developing a transfusion reaction. This is an allergic reaction to something in the blood you  were given. Every precaution is taken to prevent this. The decision to have a blood transfusion has been considered carefully by your caregiver before blood is given. Blood is not given unless the benefits outweigh the risks.  AFTER SURGERY INSTRUCTIONS  Return to work: 4-6 weeks if applicable  Activity: 1. Be up and out of the bed during the day.  Take a nap if needed.  You may walk up steps but be careful and use the hand rail.  Stair climbing will tire you more than you think, you may need to stop part way and rest.   2. No lifting or straining for 6 weeks over 10 pounds. No pushing, pulling, straining for 6 weeks.  3. No driving for 1-61 days when the following criteria have been met: Do not drive if you are taking narcotic pain medicine and make sure that your reaction time has returned.   4. You can shower as soon as the next day after surgery. Shower daily.  Use your regular soap and water (not directly on the incision) and pat your incision(s) dry afterwards; don't rub.  No tub baths or submerging your body in water until cleared by your surgeon. If you have the  soap that was given to you by pre-surgical testing that was used before surgery, you do not need to use it afterwards because this can irritate your incisions.   5. You may experience a small amount of clear drainage from your incision, which is normal.  If the drainage persists, increases, or changes color please call the office.  6. Do not use creams, lotions, or ointments such as neosporin on your incisions after surgery until advised by your surgeon because they can cause removal of the dermabond glue on your incisions.    7. Take Tylenol  or ibuprofen first for pain if you are able to take these medications and only use narcotic pain medication for severe pain not relieved by the Tylenol  or Ibuprofen.  Monitor your Tylenol  intake to a max of 4,000 mg in a 24 hour period. You can alternate these medications after surgery.  Diet: 1. Low sodium Heart Healthy Diet is recommended but you are cleared to resume your normal (before surgery) diet after your procedure.  2. It is safe to use a laxative, such as Miralax or Colace, if you have difficulty moving your bowels before surgery. You have been prescribed Sennakot-S to take at bedtime every evening after surgery to keep bowel movements regular and to prevent constipation.    Wound Care: 1. Keep clean and dry.  Shower daily.  Reasons to call the Doctor: Fever - Oral temperature greater than 100.4 degrees Fahrenheit Foul-smelling vaginal discharge Difficulty urinating Nausea and vomiting Increased pain at the site of the incision that is unrelieved with pain medicine. Difficulty breathing with or without chest pain New calf pain especially if only on one side Sudden, continuing increased vaginal bleeding with or without clots.   Contacts: For questions or concerns you should contact:  Dr. Wiley Hanger at [phone]  Vira Grieves, NP at [phone]  After Hours: call [phone] and have the GYN Oncologist paged/contacted (after  5 pm or on the weekends). You will speak with an after hours RN and let he or she know you have had surgery.  Messages sent via mychart are for non-urgent matters and are not responded to after hours so for urgent needs, please call the after hours number.

## 2023-07-26 ENCOUNTER — Encounter (HOSPITAL_COMMUNITY)
Admission: RE | Admit: 2023-07-26 | Discharge: 2023-07-26 | Disposition: A | Discharge status: Home / Self Care | Source: Ambulatory Visit | Attending: Gynecologic Oncology | Admitting: Gynecologic Oncology

## 2023-07-26 ENCOUNTER — Inpatient Hospital Stay: Attending: Gynecologic Oncology | Admitting: Gynecologic Oncology

## 2023-07-26 ENCOUNTER — Other Ambulatory Visit: Payer: Self-pay

## 2023-07-26 ENCOUNTER — Encounter (HOSPITAL_COMMUNITY): Payer: Self-pay

## 2023-07-26 VITALS — BP 122/55 | HR 63 | Temp 98.7°F | Resp 16 | Ht 62.0 in | Wt 249.0 lb

## 2023-07-26 DIAGNOSIS — Z01812 Encounter for preprocedural laboratory examination: Secondary | ICD-10-CM | POA: Insufficient documentation

## 2023-07-26 DIAGNOSIS — C519 Malignant neoplasm of vulva, unspecified: Secondary | ICD-10-CM | POA: Insufficient documentation

## 2023-07-26 HISTORY — DX: Unspecified osteoarthritis, unspecified site: M19.90

## 2023-07-26 LAB — COMPREHENSIVE METABOLIC PANEL WITH GFR
ALT: 20 U/L (ref 0–44)
AST: 23 U/L (ref 15–41)
Albumin: 3.5 g/dL (ref 3.5–5.0)
Alkaline Phosphatase: 93 U/L (ref 38–126)
Anion gap: 7 (ref 5–15)
BUN: 21 mg/dL (ref 8–23)
CO2: 24 mmol/L (ref 22–32)
Calcium: 8.7 mg/dL — ABNORMAL LOW (ref 8.9–10.3)
Chloride: 105 mmol/L (ref 98–111)
Creatinine, Ser: 0.48 mg/dL (ref 0.44–1.00)
GFR, Estimated: >60 mL/min (ref >60)
Glucose, Bld: 103 mg/dL — ABNORMAL HIGH (ref 70–99)
Potassium: 4.4 mmol/L (ref 3.5–5.1)
Sodium: 136 mmol/L (ref 135–145)
Total Bilirubin: 0.6 mg/dL (ref 0.0–1.2)
Total Protein: 7.3 g/dL (ref 6.5–8.1)

## 2023-07-26 LAB — CBC
HCT: 38.9 % (ref 36.0–46.0)
Hemoglobin: 12.1 g/dL (ref 12.0–15.0)
MCH: 26.8 pg (ref 26.0–34.0)
MCHC: 31.1 g/dL (ref 30.0–36.0)
MCV: 86.3 fL (ref 80.0–100.0)
Platelets: 230 10*3/uL (ref 150–400)
RBC: 4.51 MIL/uL (ref 3.87–5.11)
RDW: 13.7 % (ref 11.5–15.5)
WBC: 7.3 10*3/uL (ref 4.0–10.5)
nRBC: 0 % (ref 0.0–0.2)

## 2023-07-26 MED ORDER — TRAMADOL HCL 50 MG PO TABS
50.0000 mg | ORAL_TABLET | Freq: Four times a day (QID) | ORAL | 0 refills | Status: DC | PRN
Start: 2023-07-26 — End: 2023-11-24

## 2023-07-28 ENCOUNTER — Telehealth: Payer: Self-pay | Admitting: Radiation Oncology

## 2023-07-28 NOTE — Telephone Encounter (Signed)
Left message for patient to call back to schedule consult per 4/30 referral. 

## 2023-08-01 NOTE — Anesthesia Preprocedure Evaluation (Signed)
 Anesthesia Evaluation  Patient identified by MRN, date of birth, ID band Patient awake    Reviewed: Allergy & Precautions, NPO status , Patient's Chart, lab work & pertinent test results  Airway Mallampati: III  TM Distance: >3 FB Neck ROM: Full    Dental  (+) Teeth Intact, Dental Advisory Given   Pulmonary former smoker   Pulmonary exam normal breath sounds clear to auscultation       Cardiovascular negative cardio ROS Normal cardiovascular exam Rhythm:Regular Rate:Normal     Neuro/Psych  PSYCHIATRIC DISORDERS Anxiety Depression    negative neurological ROS     GI/Hepatic Neg liver ROS, hiatal hernia,,,S/p gastric bypass 2020   Endo/Other  Hypothyroidism  Class 3 obesityBMI 46  Renal/GU negative Renal ROS   Vulvar ca    Musculoskeletal  (+) Arthritis , Osteoarthritis,    Abdominal  (+) + obese  Peds  Hematology negative hematology ROS (+)   Anesthesia Other Findings   Reproductive/Obstetrics negative OB ROS                             Anesthesia Physical Anesthesia Plan  ASA: 3  Anesthesia Plan: General   Post-op Pain Management: Tylenol  PO (pre-op)* and Toradol  IV (intra-op)*   Induction: Intravenous  PONV Risk Score and Plan: 3 and Ondansetron , Dexamethasone , Midazolam  and Treatment may vary due to age or medical condition  Airway Management Planned: LMA  Additional Equipment: None  Intra-op Plan:   Post-operative Plan: Extubation in OR  Informed Consent: I have reviewed the patients History and Physical, chart, labs and discussed the procedure including the risks, benefits and alternatives for the proposed anesthesia with the patient or authorized representative who has indicated his/her understanding and acceptance.     Dental advisory given  Plan Discussed with: CRNA  Anesthesia Plan Comments:        Anesthesia Quick Evaluation

## 2023-08-02 ENCOUNTER — Telehealth: Payer: Self-pay | Admitting: Radiation Oncology

## 2023-08-02 ENCOUNTER — Telehealth: Payer: Self-pay | Admitting: *Deleted

## 2023-08-02 NOTE — Telephone Encounter (Signed)
Left message for patient to call back to schedule consult per 4/30 referral. 

## 2023-08-02 NOTE — Telephone Encounter (Signed)
 Telephone call to check on pre-operative status.  Patient compliant with pre-operative instructions.  Reinforced nothing to eat after midnight. Clear liquids until 1230. Patient to arrive at 1330.  No questions or concerns voiced.  Instructed to call for any needs.

## 2023-08-03 ENCOUNTER — Ambulatory Visit (HOSPITAL_COMMUNITY)
Admission: RE | Admit: 2023-08-03 | Discharge: 2023-08-04 | Disposition: A | Discharge status: Home / Self Care | Source: Ambulatory Visit | Attending: Gynecologic Oncology | Admitting: Gynecologic Oncology

## 2023-08-03 ENCOUNTER — Other Ambulatory Visit: Payer: Self-pay

## 2023-08-03 ENCOUNTER — Ambulatory Visit (HOSPITAL_BASED_OUTPATIENT_CLINIC_OR_DEPARTMENT_OTHER): Admitting: Anesthesiology

## 2023-08-03 ENCOUNTER — Encounter (HOSPITAL_COMMUNITY): Payer: Self-pay | Admitting: Gynecologic Oncology

## 2023-08-03 ENCOUNTER — Ambulatory Visit (HOSPITAL_COMMUNITY): Admitting: Anesthesiology

## 2023-08-03 ENCOUNTER — Encounter (HOSPITAL_COMMUNITY)
Admission: RE | Disposition: A | Discharge status: Home / Self Care | Payer: Self-pay | Source: Ambulatory Visit | Attending: Gynecologic Oncology

## 2023-08-03 DIAGNOSIS — E039 Hypothyroidism, unspecified: Secondary | ICD-10-CM | POA: Diagnosis not present

## 2023-08-03 DIAGNOSIS — Z6841 Body Mass Index (BMI) 40.0 and over, adult: Secondary | ICD-10-CM

## 2023-08-03 DIAGNOSIS — E66813 Obesity, class 3: Secondary | ICD-10-CM

## 2023-08-03 DIAGNOSIS — Z87891 Personal history of nicotine dependence: Secondary | ICD-10-CM | POA: Insufficient documentation

## 2023-08-03 DIAGNOSIS — C519 Malignant neoplasm of vulva, unspecified: Secondary | ICD-10-CM

## 2023-08-03 DIAGNOSIS — M1990 Unspecified osteoarthritis, unspecified site: Secondary | ICD-10-CM | POA: Diagnosis not present

## 2023-08-03 DIAGNOSIS — N762 Acute vulvitis: Secondary | ICD-10-CM | POA: Insufficient documentation

## 2023-08-03 DIAGNOSIS — F32A Depression, unspecified: Secondary | ICD-10-CM | POA: Diagnosis not present

## 2023-08-03 DIAGNOSIS — C774 Secondary and unspecified malignant neoplasm of inguinal and lower limb lymph nodes: Secondary | ICD-10-CM

## 2023-08-03 DIAGNOSIS — Z8544 Personal history of malignant neoplasm of other female genital organs: Secondary | ICD-10-CM | POA: Diagnosis not present

## 2023-08-03 DIAGNOSIS — F419 Anxiety disorder, unspecified: Secondary | ICD-10-CM | POA: Insufficient documentation

## 2023-08-03 DIAGNOSIS — Z9884 Bariatric surgery status: Secondary | ICD-10-CM | POA: Insufficient documentation

## 2023-08-03 HISTORY — PX: INGUINAL LYMPHADENECTOMY: SHX6587

## 2023-08-03 LAB — TYPE AND SCREEN
ABO/RH(D): A POS
Antibody Screen: NEGATIVE

## 2023-08-03 SURGERY — LYMPHADENECTOMY, INGUINAL, OPEN
Anesthesia: General | Laterality: Right

## 2023-08-03 MED ORDER — HYDROMORPHONE HCL 1 MG/ML IJ SOLN: 0.5000 mg | INTRAMUSCULAR | Status: DC | PRN

## 2023-08-03 MED ORDER — SENNOSIDES-DOCUSATE SODIUM 8.6-50 MG PO TABS
2.0000 | ORAL_TABLET | Freq: Every day | ORAL | Status: DC
  Administered 2023-08-03: 2 via ORAL
  Filled 2023-08-03: qty 2

## 2023-08-03 MED ORDER — BUPIVACAINE HCL 0.25 % IJ SOLN
INTRAMUSCULAR | Status: DC | PRN
  Administered 2023-08-03: 10 mL

## 2023-08-03 MED ORDER — ONDANSETRON HCL 4 MG/2ML IJ SOLN: 4.0000 mg | Freq: Once | INTRAMUSCULAR | Status: DC | PRN

## 2023-08-03 MED ORDER — LIDOCAINE 2% (20 MG/ML) 5 ML SYRINGE
INTRAMUSCULAR | Status: DC | PRN
  Administered 2023-08-03: 60 mg via INTRAVENOUS

## 2023-08-03 MED ORDER — GLYCOPYRROLATE 0.2 MG/ML IJ SOLN
INTRAMUSCULAR | Status: AC
  Filled 2023-08-03: qty 1

## 2023-08-03 MED ORDER — MIDAZOLAM HCL 2 MG/2ML IJ SOLN
INTRAMUSCULAR | Status: AC
  Filled 2023-08-03: qty 2

## 2023-08-03 MED ORDER — PHENYLEPHRINE 80 MCG/ML (10ML) SYRINGE FOR IV PUSH (FOR BLOOD PRESSURE SUPPORT)
PREFILLED_SYRINGE | INTRAVENOUS | Status: DC | PRN
  Administered 2023-08-03: 80 ug via INTRAVENOUS
  Administered 2023-08-03: 160 ug via INTRAVENOUS
  Administered 2023-08-03: 80 ug via INTRAVENOUS

## 2023-08-03 MED ORDER — BUPIVACAINE HCL (PF) 0.25 % IJ SOLN
INTRAMUSCULAR | Status: AC
  Filled 2023-08-03: qty 30

## 2023-08-03 MED ORDER — MIDAZOLAM HCL 2 MG/2ML IJ SOLN
INTRAMUSCULAR | Status: DC | PRN
  Administered 2023-08-03 (×2): 1 mg via INTRAVENOUS

## 2023-08-03 MED ORDER — TRAMADOL HCL 50 MG PO TABS: 100.0000 mg | ORAL_TABLET | Freq: Two times a day (BID) | ORAL | Status: DC | PRN

## 2023-08-03 MED ORDER — PHENYLEPHRINE 80 MCG/ML (10ML) SYRINGE FOR IV PUSH (FOR BLOOD PRESSURE SUPPORT)
PREFILLED_SYRINGE | INTRAVENOUS | Status: AC
  Filled 2023-08-03: qty 10

## 2023-08-03 MED ORDER — LEVOTHYROXINE SODIUM 25 MCG PO TABS
137.0000 ug | ORAL_TABLET | Freq: Every day | ORAL | Status: DC
  Administered 2023-08-04: 137 ug via ORAL
  Filled 2023-08-03: qty 1

## 2023-08-03 MED ORDER — FLUOXETINE HCL 20 MG PO CAPS
40.0000 mg | ORAL_CAPSULE | Freq: Every day | ORAL | Status: DC
  Administered 2023-08-03: 40 mg via ORAL
  Filled 2023-08-03: qty 2

## 2023-08-03 MED ORDER — OXYCODONE HCL 5 MG PO TABS: 5.0000 mg | ORAL_TABLET | Freq: Once | ORAL | Status: DC | PRN

## 2023-08-03 MED ORDER — HYDROMORPHONE HCL 1 MG/ML IJ SOLN
INTRAMUSCULAR | Status: DC | PRN
  Administered 2023-08-03 (×2): .5 mg via INTRAVENOUS

## 2023-08-03 MED ORDER — HYDROMORPHONE HCL 2 MG/ML IJ SOLN
INTRAMUSCULAR | Status: AC
  Filled 2023-08-03: qty 1

## 2023-08-03 MED ORDER — AMISULPRIDE (ANTIEMETIC) 5 MG/2ML IV SOLN: 10.0000 mg | Freq: Once | INTRAVENOUS | Status: DC | PRN

## 2023-08-03 MED ORDER — OXYCODONE HCL 5 MG PO TABS: 5.0000 mg | ORAL_TABLET | ORAL | Status: DC | PRN

## 2023-08-03 MED ORDER — EPHEDRINE 5 MG/ML INJ
INTRAVENOUS | Status: AC
  Filled 2023-08-03: qty 5

## 2023-08-03 MED ORDER — BUPIVACAINE LIPOSOME 1.3 % IJ SUSP
INTRAMUSCULAR | Status: AC
  Filled 2023-08-03: qty 20

## 2023-08-03 MED ORDER — DIPHENHYDRAMINE HCL 50 MG/ML IJ SOLN
12.5000 mg | Freq: Four times a day (QID) | INTRAMUSCULAR | Status: DC | PRN
  Administered 2023-08-03: 12.5 mg via INTRAVENOUS

## 2023-08-03 MED ORDER — SODIUM CHLORIDE 0.9% FLUSH: 3.0000 mL | INTRAVENOUS | Status: DC | PRN

## 2023-08-03 MED ORDER — IBUPROFEN 400 MG PO TABS: 800.0000 mg | ORAL_TABLET | Freq: Four times a day (QID) | ORAL | Status: DC

## 2023-08-03 MED ORDER — ACETAMINOPHEN 500 MG PO TABS
1000.0000 mg | ORAL_TABLET | Freq: Once | ORAL | Status: AC
  Administered 2023-08-03: 1000 mg via ORAL
  Filled 2023-08-03: qty 2

## 2023-08-03 MED ORDER — DEXAMETHASONE SODIUM PHOSPHATE 10 MG/ML IJ SOLN
4.0000 mg | INTRAMUSCULAR | Status: AC
  Administered 2023-08-03: 8 mg via INTRAVENOUS

## 2023-08-03 MED ORDER — ACETAMINOPHEN 500 MG PO TABS: 1000.0000 mg | ORAL_TABLET | ORAL | Status: DC

## 2023-08-03 MED ORDER — DIPHENHYDRAMINE HCL 50 MG/ML IJ SOLN
INTRAMUSCULAR | Status: AC
  Filled 2023-08-03: qty 1

## 2023-08-03 MED ORDER — 0.9 % SODIUM CHLORIDE (POUR BTL) OPTIME
TOPICAL | Status: DC | PRN
  Administered 2023-08-03: 1000 mL

## 2023-08-03 MED ORDER — CEFAZOLIN SODIUM-DEXTROSE 2-4 GM/100ML-% IV SOLN
2.0000 g | INTRAVENOUS | Status: AC
  Administered 2023-08-03: 2 g via INTRAVENOUS
  Filled 2023-08-03: qty 100

## 2023-08-03 MED ORDER — FENTANYL CITRATE (PF) 250 MCG/5ML IJ SOLN
INTRAMUSCULAR | Status: AC
  Filled 2023-08-03: qty 5

## 2023-08-03 MED ORDER — HYDROMORPHONE HCL 1 MG/ML IJ SOLN: 0.2500 mg | INTRAMUSCULAR | Status: DC | PRN

## 2023-08-03 MED ORDER — CHLORHEXIDINE GLUCONATE 0.12 % MT SOLN
15.0000 mL | Freq: Once | OROMUCOSAL | Status: AC
  Administered 2023-08-03: 15 mL via OROMUCOSAL

## 2023-08-03 MED ORDER — BUPIVACAINE LIPOSOME 1.3 % IJ SUSP
INTRAMUSCULAR | Status: DC | PRN
  Administered 2023-08-03: 20 mL

## 2023-08-03 MED ORDER — ACETAMINOPHEN 500 MG PO TABS
1000.0000 mg | ORAL_TABLET | Freq: Two times a day (BID) | ORAL | Status: DC
  Administered 2023-08-03 – 2023-08-04 (×2): 1000 mg via ORAL
  Filled 2023-08-03 (×2): qty 2

## 2023-08-03 MED ORDER — FENTANYL CITRATE (PF) 250 MCG/5ML IJ SOLN
INTRAMUSCULAR | Status: DC | PRN
  Administered 2023-08-03 (×3): 25 ug via INTRAVENOUS
  Administered 2023-08-03: 100 ug via INTRAVENOUS
  Administered 2023-08-03: 50 ug via INTRAVENOUS
  Administered 2023-08-03: 25 ug via INTRAVENOUS

## 2023-08-03 MED ORDER — KETOROLAC TROMETHAMINE 30 MG/ML IJ SOLN: 15.0000 mg | Freq: Once | INTRAMUSCULAR | Status: DC | PRN

## 2023-08-03 MED ORDER — ORAL CARE MOUTH RINSE: 15.0000 mL | Freq: Once | OROMUCOSAL | Status: AC

## 2023-08-03 MED ORDER — PROPOFOL 10 MG/ML IV BOLUS
INTRAVENOUS | Status: AC
  Filled 2023-08-03: qty 20

## 2023-08-03 MED ORDER — EPHEDRINE SULFATE-NACL 50-0.9 MG/10ML-% IV SOSY
PREFILLED_SYRINGE | INTRAVENOUS | Status: DC | PRN
  Administered 2023-08-03 (×2): 10 mg via INTRAVENOUS
  Administered 2023-08-03: 5 mg via INTRAVENOUS

## 2023-08-03 MED ORDER — ONDANSETRON HCL 4 MG PO TABS: 4.0000 mg | ORAL_TABLET | Freq: Four times a day (QID) | ORAL | Status: DC | PRN

## 2023-08-03 MED ORDER — GLYCOPYRROLATE 0.2 MG/ML IJ SOLN
INTRAMUSCULAR | Status: DC | PRN
Start: 2023-08-03 — End: 2023-08-03
  Administered 2023-08-03: .1 mg via INTRAVENOUS

## 2023-08-03 MED ORDER — ONDANSETRON HCL 4 MG/2ML IJ SOLN
INTRAMUSCULAR | Status: DC | PRN
Start: 2023-08-03 — End: 2023-08-03
  Administered 2023-08-03: 4 mg via INTRAVENOUS

## 2023-08-03 MED ORDER — SODIUM CHLORIDE 0.9% FLUSH
3.0000 mL | Freq: Two times a day (BID) | INTRAVENOUS | Status: DC
Start: 2023-08-03 — End: 2023-08-04
  Administered 2023-08-03: 10 mL via INTRAVENOUS

## 2023-08-03 MED ORDER — POVIDONE-IODINE 10 % EX SWAB: 2.0000 | Freq: Once | CUTANEOUS | Status: DC

## 2023-08-03 MED ORDER — OXYCODONE HCL 5 MG/5ML PO SOLN: 5.0000 mg | Freq: Once | ORAL | Status: DC | PRN

## 2023-08-03 MED ORDER — HEPARIN SODIUM (PORCINE) 5000 UNIT/ML IJ SOLN
5000.0000 [IU] | INTRAMUSCULAR | Status: AC
  Administered 2023-08-03: 5000 [IU] via SUBCUTANEOUS
  Filled 2023-08-03: qty 1

## 2023-08-03 MED ORDER — PROPOFOL 10 MG/ML IV BOLUS
INTRAVENOUS | Status: DC | PRN
  Administered 2023-08-03: 200 mg via INTRAVENOUS

## 2023-08-03 MED ORDER — ARTIFICIAL TEARS OPHTHALMIC OINT
TOPICAL_OINTMENT | OPHTHALMIC | Status: AC
  Filled 2023-08-03: qty 3.5

## 2023-08-03 MED ORDER — LACTATED RINGERS IV SOLN: INTRAVENOUS | Status: DC

## 2023-08-03 MED ORDER — ONDANSETRON HCL 4 MG/2ML IJ SOLN
4.0000 mg | Freq: Four times a day (QID) | INTRAMUSCULAR | Status: DC | PRN
Start: 2023-08-03 — End: 2023-08-04

## 2023-08-03 SURGICAL SUPPLY — 47 items
BAG COUNTER SPONGE SURGICOUNT (BAG) IMPLANT
BNDG GAUZE DERMACEA FLUFF 4 (GAUZE/BANDAGES/DRESSINGS) IMPLANT
CHLORAPREP W/TINT 26 (MISCELLANEOUS) ×2 IMPLANT
CLIP TI LARGE 6 (CLIP) IMPLANT
CLIP TI MEDIUM 6 (CLIP) ×2 IMPLANT
CLIP TI MEDIUM LARGE 6 (CLIP) IMPLANT
CLIP TI WIDE RED SMALL 6 (CLIP) IMPLANT
COVER SURGICAL LIGHT HANDLE (MISCELLANEOUS) ×2 IMPLANT
DERMABOND ADVANCED .7 DNX12 (GAUZE/BANDAGES/DRESSINGS) ×2 IMPLANT
DERMABOND ADVANCED .7 DNX6 (GAUZE/BANDAGES/DRESSINGS) IMPLANT
DRAIN CHANNEL 10F 3/8 F FF (DRAIN) IMPLANT
DRAPE LAPAROTOMY T 98X78 PEDS (DRAPES) ×2 IMPLANT
DRAPE SHEET LG 3/4 BI-LAMINATE (DRAPES) IMPLANT
DRAPE UTILITY XL STRL (DRAPES) IMPLANT
DRSG TEGADERM 4X4.75 (GAUZE/BANDAGES/DRESSINGS) IMPLANT
DRSG TEGADERM 8X12 (GAUZE/BANDAGES/DRESSINGS) IMPLANT
ELECT REM PT RETURN 15FT ADLT (MISCELLANEOUS) ×2 IMPLANT
EVACUATOR SILICONE 100CC (DRAIN) IMPLANT
GAUZE 4X4 16PLY ~~LOC~~+RFID DBL (SPONGE) IMPLANT
GAUZE SPONGE 2X2 8PLY STRL LF (GAUZE/BANDAGES/DRESSINGS) IMPLANT
GAUZE SPONGE 2X2 STRL 8-PLY (GAUZE/BANDAGES/DRESSINGS) IMPLANT
GLOVE BIO SURGEON STRL SZ 6 (GLOVE) ×2 IMPLANT
GLOVE BIO SURGEON STRL SZ 6.5 (GLOVE) ×2 IMPLANT
GOWN STRL REUS W/ TWL LRG LVL3 (GOWN DISPOSABLE) ×4 IMPLANT
KIT BASIN OR (CUSTOM PROCEDURE TRAY) ×2 IMPLANT
KIT TURNOVER KIT A (KITS) IMPLANT
LEGGING LITHOTOMY PAIR STRL (DRAPES) ×2 IMPLANT
NDL HYPO 22X1.5 SAFETY MO (MISCELLANEOUS) ×2 IMPLANT
NEEDLE HYPO 22X1.5 SAFETY MO (MISCELLANEOUS) ×1 IMPLANT
PACK GENERAL/GYN (CUSTOM PROCEDURE TRAY) ×2 IMPLANT
SCRUB CHG 4% DYNA-HEX 4OZ (MISCELLANEOUS) IMPLANT
SHEARS HARMONIC 9CM CVD (BLADE) IMPLANT
SHEET LAVH (DRAPES) IMPLANT
SPONGE DRAIN TRACH 4X4 STRL 2S (GAUZE/BANDAGES/DRESSINGS) IMPLANT
SUT ETHILON 3 0 PS 1 (SUTURE) IMPLANT
SUT MNCRL AB 4-0 PS2 18 (SUTURE) ×2 IMPLANT
SUT SILK 2-0 18XBRD TIE 12 (SUTURE) ×2 IMPLANT
SUT SILK 3-0 18XBRD TIE 12 (SUTURE) IMPLANT
SUT VIC AB 2-0 SH 18 (SUTURE) IMPLANT
SUT VIC AB 3-0 CT1 36 (SUTURE) IMPLANT
SUT VIC AB 3-0 SH 18 (SUTURE) IMPLANT
SUT VIC AB 3-0 SH 27X BRD (SUTURE) IMPLANT
SUT VIC AB 4-0 PS2 27 (SUTURE) IMPLANT
SYR 20ML LL LF (SYRINGE) ×2 IMPLANT
SYR BULB IRRIG 60ML STRL (SYRINGE) IMPLANT
TOWEL OR 17X26 10 PK STRL BLUE (TOWEL DISPOSABLE) ×2 IMPLANT
TRAY FOLEY MTR SLVR 16FR STAT (SET/KITS/TRAYS/PACK) IMPLANT

## 2023-08-03 NOTE — Anesthesia Procedure Notes (Signed)
 Procedure Name: LMA Insertion Date/Time: 08/03/2023 3:45 PM  Performed by: Norvell Beers, CRNAPre-anesthesia Checklist: Patient identified, Emergency Drugs available, Suction available, Patient being monitored and Timeout performed Patient Re-evaluated:Patient Re-evaluated prior to induction Oxygen Delivery Method: Circle system utilized Preoxygenation: Pre-oxygenation with 100% oxygen Induction Type: IV induction Ventilation: Mask ventilation without difficulty LMA: LMA inserted LMA Size: 3.0 Number of attempts: 2 Placement Confirmation: positive ETCO2 Tube secured with: Tape Comments: Atraumatic. Patient has very small/poor mouth opening. Aborted #4 attempt after placing the tip between the teeth. Obviously not going to pass. Too big and too wide for patients mouth. Changed to number 3 LMA. Passed easily but required two people. Anesthesiologist assisted in opening and holding mouth open with tongue blade while CRNA inserted LMA. Atraumatic. Passed easily once the mouth was opened.

## 2023-08-03 NOTE — Op Note (Signed)
 OPERATIVE NOTE  Pre-operative Diagnosis: Vulvar cancer, right SLN biopsy with 6 mm tumor metastasis  Post-operative Diagnosis: same  Operation: Right inguinal lymph node dissection, right vulvar biopsy Morbid obesity requiring additional OR personnel for positioning and retraction. Obesity made retroperitoneal visualization limited and increased the complexity of the case and necessitated additional instrumentation for retraction. Obesity related complexity increased the duration of the procedure by 40 minutes.   Surgeon: Wiley Hanger MD  Assistant Surgeon: Abdul Hodgkin MD (an MD assistant was necessary for tissue manipulation, management of robotic instrumentation, retraction and positioning due to the complexity of the case and hospital policies).   Anesthesia: GET  Urine Output: 50 cc  Operative Findings: No palpable lymph nodes. On inguinal exam. Some scar tissue/adhesions noted from prior SLN biopsy procedure. 1.5 cm area of hyperpigmented tissue on the mid right vulva, 9 o'clock, most consistent with granulation tissue (biopsied).  Estimated Blood Loss:  50 cc      Total IV Fluids: see I&O flowsheet         Specimens: right inguinal lymph nodes         Complications:  None apparent; patient tolerated the procedure well.         Disposition: PACU - hemodynamically stable.  Procedure Details  The patient was seen in the Holding Room. The risks, benefits, complications, treatment options, and expected outcomes were discussed with the patient.  The patient concurred with the proposed plan, giving informed consent.  The site of surgery properly noted/marked. The patient was identified as Leslie [name] and the procedure verified as a right inguinal lymphadenectomy.   After induction of anesthesia, the patient was draped and prepped in the usual sterile manner. Patient was placed in supine position after anesthesia and draped and prepped in the usual sterile manner. The  patient was placed in the semi-lithotomy position in [place] stirrups.  The perineum and vagina were prepped with Betadine. The urethra was prepped with Betadine. Foley catheter was placed.  The patient's right groin, upper right leg, and lower abdomen were prepped with ChloraPrep and then she was draped after the prep had been allowed to dry for 3 minutes.  A Time Out was held and the above information confirmed.  The right inguino-femoral lymphadenectomy was performed. The bony landmarks of the pubic tubercle and the ASIS were identified and the palpable location of the femoral artery. A 10-12 cm incision was made 2 fingerbreadth below the inguinal ligament on the right anterior thigh/groin parallel to the inguinal ligament. The incision was made to connect with the prior incision from her sentinel lymph node biopsy. The camper's fascia was scored and the inferior and superior skin flaps were created with elevation of skin hooks and use of the bovie for dissection. The inguino-femoral lymph nodes were circumscribed in the operative bed. Lymph node tissue was grasped and using bovie dissection, the entire inguinofemoral bed was resected from its attachments to the level of the cribiform facia overlying the femoral vessels. The boundaries of this dissection was the sartorius muscle laterally, the adductor longus tendon medially, the inguinal ligament superiorally, the cribiform fascia posteriorally. The great saphenous vein was identified and taken during this dissection, secured with clips. A stab wound was made with an 11 blade scalpel lateral and inferior to the incision and a 15 french JP drain was placed in the bed of the groin bed. It was secured with nylon. The camper's fascia was closed with running 2-0 vicryl overlying the drain. Exparel  was injected  for local anesthesia. The superficial subcutaneous tissue was reapproximated with 4-0 Vicryl and the skin was closed with running 4-0 monocryl and  dermabond.  A biopsy of the right vulva was taken with Tischler forceps and bovie monopolar electrocautery was used to achieve hemostasis. Exparel  was then injected for local anesthesia.  Foley catheter was removed.  All sponge, lap and needle counts were correct x  3.   The patient was transferred to the recovery room in stable condition.  Wiley Hanger, MD

## 2023-08-03 NOTE — Anesthesia Postprocedure Evaluation (Signed)
 Anesthesia Post Note  Patient: Leslie [name]  Procedure(s) Performed: LYMPHADENECTOMY, INGUINAL, OPEN; VULVA BIOPSY (Right)     Patient location during evaluation: PACU Anesthesia Type: General Level of consciousness: awake and alert, oriented and patient cooperative Pain management: pain level controlled Vital Signs Assessment: post-procedure vital signs reviewed and stable Respiratory status: spontaneous breathing, nonlabored ventilation and respiratory function stable Cardiovascular status: blood pressure returned to baseline and stable Postop Assessment: no apparent nausea or vomiting Anesthetic complications: no   No notable events documented.  Last Vitals:  Vitals:   08/03/23 1752 08/03/23 1800  BP: (!) 140/71 (!) 146/71  Pulse: 67 62  Resp: (!) 9 (!) 9  Temp: (!) 35.8 C (!) 36 C  SpO2: 98% 99%    Last Pain:  Vitals:   08/03/23 1800  TempSrc:   PainSc: Asleep                 Jacquelyne Matte

## 2023-08-03 NOTE — Transfer of Care (Signed)
 Immediate Anesthesia Transfer of Care Note  Patient: Leslie [name]  Procedure(s) Performed: LYMPHADENECTOMY, INGUINAL, OPEN; VULVA BIOPSY (Right)  Patient Location: PACU  Anesthesia Type:General  Level of Consciousness: sedated  Airway & Oxygen Therapy: Patient Spontanous Breathing and Patient connected to face mask oxygen  Post-op Assessment: Report given to RN and Post -op Vital signs reviewed and stable  Post vital signs: Reviewed and stable  Last Vitals:  Vitals Value Taken Time  BP 140/71 08/03/23 1752  Temp    Pulse 63 08/03/23 1755  Resp 9 08/03/23 1755  SpO2 99 % 08/03/23 1755  Vitals shown include unfiled device data.  Last Pain:  Vitals:   08/03/23 1319  TempSrc:   PainSc: 0-No pain      Patients Stated Pain Goal: 4 (08/03/23 1319)  Complications: No notable events documented.

## 2023-08-03 NOTE — Interval H&P Note (Signed)
 History and Physical Interval Note:  08/03/2023 1:06 PM  Leslie [name]  has presented today for surgery, with the diagnosis of VULVAR CANCER.  The various methods of treatment have been discussed with the patient and family. After consideration of risks, benefits and other options for treatment, the patient has consented to  Procedure(s): LYMPHADENECTOMY, INGUINAL, OPEN (Right) as a surgical intervention.  The patient's history has been reviewed, patient examined, no change in status, stable for surgery.  I have reviewed the patient's chart and labs.  Questions were answered to the patient's satisfaction.     Suzi Essex

## 2023-08-03 NOTE — Plan of Care (Signed)

## 2023-08-03 NOTE — Discharge Instructions (Addendum)
 AFTER SURGERY INSTRUCTIONS   Return to work: 4-6 weeks if applicable  You will need to empty the JP drain several times a day and change the dressing around the drain once daily.    Activity: 1. Be up and out of the bed during the day.  Take a nap if needed.  You may walk up steps but be careful and use the hand rail.  Stair climbing will tire you more than you think, you may need to stop part way and rest.    2. No lifting or straining for 6 weeks over 10 pounds. No pushing, pulling, straining for 6 weeks.   3. No driving for 2-44 days when the following criteria have been met: Do not drive if you are taking narcotic pain medicine and make sure that your reaction time has returned.    4. You can shower as soon as the next day after surgery. Shower daily.  Use your regular soap and water (not directly on the incision) and pat your incision(s) dry afterwards; don't rub.  No tub baths or submerging your body in water until cleared by your surgeon. If you have the soap that was given to you by pre-surgical testing that was used before surgery, you do not need to use it afterwards because this can irritate your incisions.    5. You may experience a small amount of clear drainage from your incision, which is normal.  If the drainage persists, increases, or changes color please call the office.   6. Do not use creams, lotions, or ointments such as neosporin on your incisions after surgery until advised by your surgeon because they can cause removal of the dermabond glue on your incisions.     7. Take Tylenol  or ibuprofen first for pain if you are able to take these medications and only use narcotic pain medication for severe pain not relieved by the Tylenol  or Ibuprofen.  Monitor your Tylenol  intake to a max of 4,000 mg in a 24 hour period. You can alternate these medications after surgery.   Diet: 1. Low sodium Heart Healthy Diet is recommended but you are cleared to resume your normal (before  surgery) diet after your procedure.   2. It is safe to use a laxative, such as Miralax or Colace, if you have difficulty moving your bowels before surgery. You have been prescribed Sennakot-S to take at bedtime every evening after surgery to keep bowel movements regular and to prevent constipation.     Wound Care: 1. Keep clean and dry.  Shower daily.   Reasons to call the Doctor: Fever - Oral temperature greater than 100.4 degrees Fahrenheit Foul-smelling vaginal discharge Difficulty urinating Nausea and vomiting Increased pain at the site of the incision that is unrelieved with pain medicine. Difficulty breathing with or without chest pain New calf pain especially if only on one side Sudden, continuing increased vaginal bleeding with or without clots.   Contacts: For questions or concerns you should contact:   Dr. Wiley Hanger at [phone]   Vira Grieves, NP at [phone]   After Hours: call [phone] and have the GYN Oncologist paged/contacted (after 5 pm or on the weekends). You will speak with an after hours RN and let he or she know you have had surgery.   Messages sent via mychart are for non-urgent matters and are not responded to after hours so for urgent needs, please call the after hours number.

## 2023-08-04 ENCOUNTER — Encounter (HOSPITAL_COMMUNITY): Payer: Self-pay | Admitting: Gynecologic Oncology

## 2023-08-04 DIAGNOSIS — C774 Secondary and unspecified malignant neoplasm of inguinal and lower limb lymph nodes: Secondary | ICD-10-CM | POA: Diagnosis not present

## 2023-08-04 LAB — CBC
HCT: 36.2 % (ref 36.0–46.0)
Hemoglobin: 11.1 g/dL — ABNORMAL LOW (ref 12.0–15.0)
MCH: 26.4 pg (ref 26.0–34.0)
MCHC: 30.7 g/dL (ref 30.0–36.0)
MCV: 86 fL (ref 80.0–100.0)
Platelets: 245 10*3/uL (ref 150–400)
RBC: 4.21 MIL/uL (ref 3.87–5.11)
RDW: 13.5 % (ref 11.5–15.5)
WBC: 8 10*3/uL (ref 4.0–10.5)
nRBC: 0 % (ref 0.0–0.2)

## 2023-08-04 LAB — BASIC METABOLIC PANEL WITH GFR
Anion gap: 6 (ref 5–15)
BUN: 15 mg/dL (ref 8–23)
CO2: 23 mmol/L (ref 22–32)
Calcium: 8.3 mg/dL — ABNORMAL LOW (ref 8.9–10.3)
Chloride: 107 mmol/L (ref 98–111)
Creatinine, Ser: 0.52 mg/dL (ref 0.44–1.00)
GFR, Estimated: >60 mL/min (ref >60)
Glucose, Bld: 109 mg/dL — ABNORMAL HIGH (ref 70–99)
Potassium: 4.5 mmol/L (ref 3.5–5.1)
Sodium: 136 mmol/L (ref 135–145)

## 2023-08-04 NOTE — Plan of Care (Signed)

## 2023-08-04 NOTE — Progress Notes (Signed)
 Assessment unchanged. Pt and daughter verbalized understanding of dc instructions including medications to resume, when to call the doctor and follow up care. Discharged via wc to front entrance accompanied by NT and daughter.

## 2023-08-04 NOTE — Progress Notes (Signed)
   08/04/23 0959  TOC Brief Assessment  Insurance and Status Reviewed  Patient has primary care physician Yes  Home environment has been reviewed home with family  Prior level of function: independent  Prior/Current Home Services No current home services  Social Drivers of Health Review SDOH reviewed no interventions necessary  Readmission risk has been reviewed Yes  Transition of care needs no transition of care needs at this time

## 2023-08-04 NOTE — Discharge Summary (Signed)
 Physician Discharge Summary  Patient ID: Leslie [name] MRN: [id number] DOB/AGE: 68-Nov-1957 68 y.o.  Admit date: 08/03/2023 Discharge date: 08/04/2023  Admission Diagnoses: Vulvar cancer [facility])  Discharge Diagnoses:  Principal Problem:   Vulvar cancer [facility]) Active Problems:   Vulva cancer [facility])   Discharged Condition:  The patient is in good condition and stable for discharge.    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  Given positive right inguinal sentinel lymph node, patient had been counseled on therapeutic options and wished to move forward with full lymphadenectomy.  On postoperative day #1, patient was meeting all postoperative milestones.  She is discharged to home in stable condition.  Consults: n/a  Significant Diagnostic Studies:     Latest Ref Rng & Units 08/04/2023    4:52 AM 07/26/2023   11:23 AM 05/10/2023    4:53 AM  CBC  WBC 4.0 - 10.5 K/uL 8.0  7.3  9.7   Hemoglobin 12.0 - 15.0 g/dL 91.4  78.2  95.6   Hematocrit 36.0 - 46.0 % 36.2  38.9  38.8   Platelets 150 - 400 K/uL 245  230  282       Latest Ref Rng & Units 08/04/2023    4:52 AM 07/26/2023   11:23 AM 05/10/2023    4:53 AM  BMP  Glucose 70 - 99 mg/dL 213  086  578   BUN 8 - 23 mg/dL 15  21  16    Creatinine 0.44 - 1.00 mg/dL 4.69  6.29  5.28   Sodium 135 - 145 mmol/L 136  136  135   Potassium 3.5 - 5.1 mmol/L 4.5  4.4  4.6   Chloride 98 - 111 mmol/L 107  105  104   CO2 22 - 32 mmol/L 23  24  23    Calcium  8.9 - 10.3 mg/dL 8.3  8.7  8.2    Treatments: Right inguinal lymph node dissection, right vulvar biopsy   Discharge Exam: Blood pressure (!) 144/60, pulse 61, temperature 97.7 F (36.5 C), temperature source Oral, resp. rate 18, height 5\' 2"  (1.575 m), weight 249 lb 0.4 oz (113 kg), SpO2 95%. See daily progress note  Disposition:  Discharge disposition: 01-Home or Self Care        Allergies as of 08/04/2023   No Known Allergies      Medication List     TAKE these medications    acetaminophen  500 MG tablet Commonly known as: TYLENOL  Take 500 mg by mouth every 6 (six) hours as needed for moderate pain (pain score 4-6).   BIOTIN PO Take 6,000 mcg by mouth daily.   CALCIUM  500 + D PO Take 2 tablets by mouth daily.   Cholecalciferol 50 MCG (2000 UT) Tabs Take 4,000 Units by mouth daily.   clobetasol  ointment 0.05 % Commonly known as: TEMOVATE  Apply to affected area every night for 4 weeks, then every other day for 4 weeks and then twice a week for 4 weeks or until resolution.   FLUoxetine  40 MG capsule Commonly known as: PROZAC  Take 40 mg by mouth at bedtime.   hydrOXYzine  25 MG tablet Commonly known as: ATARAX  TAKE 1 TABLET(25 MG) BY MOUTH AT BEDTIME AS NEEDED FOR ITCHING   IRON PO Take 26 mg by mouth daily.   levothyroxine  137 MCG tablet Commonly known as: SYNTHROID  Take 137 mcg by  mouth daily before breakfast.   loratadine 10 MG tablet Commonly known as: CLARITIN Take 10 mg by mouth daily as needed for allergies.   mineral oil-hydrophilic petrolatum ointment Apply topically as needed for dry skin.   multivitamin capsule Take 2 capsules by mouth daily.   traMADol  50 MG tablet Commonly known as: ULTRAM  Take 1 tablet (50 mg total) by mouth every 6 (six) hours as needed for severe pain (pain score 7-10). For AFTER surgery only, do not take and drive        Follow-up Information     Cross, Melissa D, NP Follow up on 08/09/2023.   Specialty: Gynecologic Oncology Why: at 2pm at the [facility] for drain check. Contact information: [address] [place] [address] [phone]         Suzi Essex, MD Follow up on 09/01/2023.   Specialty: Gynecologic Oncology Why: at 2pm at the [facility] for post-op check Contact information: [address] [place]  [place] [address] [phone]                 Greater than thirty minutes were spend for face to face discharge instructions and discharge orders/summary in EPIC.   Signed: Suzi Essex 08/04/2023, 12:29 PM

## 2023-08-04 NOTE — Progress Notes (Signed)
 1 Day Post-Op Procedure(s) (LRB): LYMPHADENECTOMY, INGUINAL, OPEN; VULVA BIOPSY (Right)  Subjective: Patient reports doing well this am. No nausea or emesis. Pain is managed and minimal. Passed small amount of flatus. Voiding without difficulty. Has ambulated to the bathroom without difficulty. Denies chest pain, dyspnea. Encouraged IS.   Objective: Vital signs in last 24 hours: Temp:  [96.4 F (35.8 C)-98.8 F (37.1 C)] 98.1 F (36.7 C) (05/15 0531) Pulse Rate:  [58-74] 60 (05/15 0531) Resp:  [9-19] 18 (05/15 0531) BP: (126-161)/(60-97) 126/60 (05/15 0531) SpO2:  [95 %-100 %] 97 % (05/15 0531) FiO2 (%):  [2 %] 2 % (05/14 2205) Weight:  [249 lb 0.4 oz (113 kg)] 249 lb 0.4 oz (113 kg) (05/14 1307) Last BM Date : 08/02/23  Intake/Output from previous day: 05/14 0701 - 05/15 0700 In: 1720 [P.O.:120; I.V.:1500; IV Piggyback:100] Out: 965 [Urine:850; Drains:40; Blood:75]  Physical Examination: General: alert, cooperative, and no distress Resp: clear to auscultation bilaterally Cardio: regular rate and rhythm, S1, S2 normal, no murmur, click, rub or gallop GI: soft, non-tender; bowel sounds normal; no masses,  no organomegaly Extremities: extremities normal, atraumatic, no cyanosis or edema Right groin incision is intact with dermabond present, mild edema, no active drainage. Right JP drain with serosanguinous drainage-drain stripped by Dr. Orvil Bland.  SCD wraps on legs but not connected  Labs: WBC/Hgb/Hct/Plts:  8.0/11.1/36.2/245 (05/15 1610) BUN/Cr/glu/ALT/AST/amyl/lip:  15/0.52/--/--/--/--/-- (05/15 9604)  Assessment: 68 y.o. s/p Procedure(s): LYMPHADENECTOMY, INGUINAL, OPEN; VULVA BIOPSY: stable Pain:  Pain is well-controlled on PRN medications.  Heme: Hgb 11.1 and Hct 36.2 this am. Appropriate given preop values and surgical losses.  ID: WBC 8.0. No evidence of infection at this time.  CV: BP and HR stable. Continue to monitor with routine vital signs.  GI:  Tolerating po:  Yes. Antiemetics ordered as needed.  GU: Voiding without difficulty. Creatinine 0.52    FEN: No critical values on am labs.  Prophylaxis: SCDs ordered  Plan: Doing well. Meeting milestones. Plan for discharge later this am after educated on JP drain   LOS: 0 days    Suellyn Emory 08/04/2023, 8:23 AM

## 2023-08-05 ENCOUNTER — Telehealth: Payer: Self-pay | Admitting: *Deleted

## 2023-08-05 ENCOUNTER — Telehealth: Payer: Self-pay | Admitting: Gynecologic Oncology

## 2023-08-05 ENCOUNTER — Encounter: Admitting: Gynecologic Oncology

## 2023-08-05 LAB — SURGICAL PATHOLOGY

## 2023-08-05 NOTE — Telephone Encounter (Signed)
 Called the patient.  Discussed pathology from surgery.  One of the lymph nodes removed was positive for metastatic disease with a 5 mm metastasis.  Plan to review her case at tumor board but I would favor radiation to the right groin given to positive lymph nodes and size of mets.  Wiley Hanger MD Gynecologic Oncology

## 2023-08-05 NOTE — Telephone Encounter (Signed)
 Spoke with Ms. Brege this morning. She states she is eating, drinking and urinating well. She has not had a BM yet but is passing gas. She is taking senokot as prescribed and encouraged her to drink plenty of water. She denies fever or chills.  She rates her pain 5/10. Her pain is controlled with tylenol . JP drain is intact and draining.     Instructed to call office with any fever, chills, purulent drainage, uncontrolled pain or any other questions or concerns. Patient verbalizes understanding.   Pt aware of post op appointments as well as the office number [phone] and after hours number [phone] to call if she has any questions or concerns

## 2023-08-08 ENCOUNTER — Other Ambulatory Visit: Payer: Self-pay | Admitting: Oncology

## 2023-08-08 NOTE — Progress Notes (Signed)
 Gynecologic Oncology Multi-Disciplinary Disposition Conference Note  Date of the Conference: 08/08/2023  Patient Name: Leslie [name]  Referring Provider: Dr. Elester Grim Primary GYN Oncologist: Dr. 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 Disposition is to radiation to the right groin.   This Multidisciplinary conference took place involving physicians from Gynecologic Oncology, Medical Oncology, Radiation Oncology, Pathology, Radiology along with the Gynecologic Oncology Nurse Practitioner and Gynecologic Oncology Nurse Navigator.  Comprehensive assessment of the patient's malignancy, staging, need for surgery, chemotherapy, radiation therapy, and need for further testing were reviewed. Supportive measures, both inpatient and following discharge were also discussed. The recommended plan of care is documented. Greater than 35 minutes were spent correlating and coordinating this patient's care.

## 2023-08-09 ENCOUNTER — Inpatient Hospital Stay (HOSPITAL_BASED_OUTPATIENT_CLINIC_OR_DEPARTMENT_OTHER): Admitting: Gynecologic Oncology

## 2023-08-09 ENCOUNTER — Encounter: Payer: Self-pay | Admitting: Gynecologic Oncology

## 2023-08-09 VITALS — BP 129/58 | HR 69 | Temp 97.8°F | Resp 16 | Wt 243.0 lb

## 2023-08-09 DIAGNOSIS — C519 Malignant neoplasm of vulva, unspecified: Secondary | ICD-10-CM

## 2023-08-09 DIAGNOSIS — Z9079 Acquired absence of other genital organ(s): Secondary | ICD-10-CM

## 2023-08-09 NOTE — Progress Notes (Incomplete)
 GYN Location of Tumor / Histology: Right inguinal lymph node   Kriste Petite presented with symptoms of: vulvar pruritus   Biopsies revealed:      Past/Anticipated interventions by Gyn/Onc surgery, if any:    Past/Anticipated interventions by medical oncology, if any: ***  Weight changes, if any: {:18581}  Bowel/Bladder complaints, if any: {yes no:314532}, {Blank single:19197::"diarrhea","constipation","urinary frequency","burning","trouble emptying bladder"," "}  Nausea/Vomiting, if any: {:18581}  Pain issues, if any:  {:18581}  SAFETY ISSUES: Prior radiation? {:18581} Pacemaker/ICD? {:18581} Possible current pregnancy? {:18581} Is the patient on methotrexate? {:18581}  Current Complaints / other details:  ***

## 2023-08-09 NOTE — Progress Notes (Signed)
 GYN Oncology Post-Operative Follow Up  Treatment History:     Oncology History Overview Note   Presented with vulvar pruritus    Vulvar cancer (HCC)   04/13/2023 Surgery     EUA, vulvar biopsy, pap test FINDINGS:  Erythema in groin folds and along inner labia majora, atrophic vaginal introitus with atrophic urethral meatus, normal appearing cervix, thickened and friable clitoral hood, 1.5cm ulcerated lesion on upper inner right labia  SPECIMENS: left labial biopsy, right labial biopsy, clitoral hood biopsy, cervical pap smear      04/13/2023 Initial Biopsy     A. LABIA, RIGHT INNER, BIOPSY:  - Invasive moderately differentiated squamous cell carcinoma, see  comment  B. CLITORAL, BIOPSY:  - Invasive moderately differentiated squamous cell carcinoma, see  comment  C. 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  Moderate hypermetabolism in the anal region, SUV max 7.7.  No pelvic adenopathy.  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  Comment that there are swirling vessels in the root of the small bowel mesentery likely due to loose mesenteric attachment       - 05/09/23- Partial modified radical vulvectomy with final pathology showing moderately differentiated squamous cell carcinoma (2x1.6x0.65, depth 6.78mm) with negative margins (2.41mm from deep stromal margin), and VIN3 consistent with at least stage IB squamous cell carcinoma of vulva Invasive and in situ well to moderately differentiated squamous cell carcinoma Tumor measures 2.0 x 1.6 x 0.65 cm and invades to a depth of 6.5 mm (pT1b) Margins free (2.4 mm from deep stromal margin; greater than 10 mm from lateral/side margins) Negative for angiolymphatic invasion One incidental  benign lymph node (0/1)   - 06/27/23 - Vulvar biopsies, laser ablation, bilateral sentinel lymph node mapping and dissection  Well healing vulvectomy bed with small area of granulation tissue superior and anterior to urethra. Intradermal injections of Tc-71m were made surrounding the patient's vulvar lesion bilaterally. Subcutaneous injections of indocyanine green were also made surrounding the patient's vulvar lesion. Successful sentinel lymph node mapping and biopsy of inguinal nodes bilaterally which were noted to be both hot, green and bright. Biopsies taken of granulation tissue and of prior area of vulvar displasia. CO2 laser ablation performed of entire area down to a second surgical layer. Silvadene cream applied. Good hemostasis at end of case.    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 Wiley Hanger. Final pathology returned with: A. RIGHT INGUINAL LYMPH NODE RESECTION:  - Metastatic squamous cell carcinoma involving one of four lymph nodes (0/4)  -  Metastatic carcinoma measures approximately 0.5 cm  - No evidence of extranodal extension  - Previous procedure-related changes   B. VULVA, RIGHT 9:00, BIOPSY:  -  Fragments of stroma with acute and chronic inflammation  - Residual mucosa is not identified  - No evidence of malignancy   Interval: Patient presents today to the office for drain check and right groin incision check. She reports doing well at home. Tolerating diet with no nausea or emesis. No significant pain reported and is using tylenol  as needed. Bladder and bowels functioning without difficulty. No issues voiced with vulvar biopsy site. No issues voiced with the right groin incision or drain. She has been emptying the drain several times a day and has been changing the drain dressing after showering daily. No lower extremity edema. Drain output below: 08/04/23: 3.5 ounces, red in color 08/05/23: 7 and 3/4 ounces, red in color 08/06/23: 5 ounces, red 08/07/23: 5 and 3/4 ounces, orange 08/08/23: 4 ounces, orange 08/09/23: 2.5 ounces, orange  Exam: Alert, oriented, in no acute distress Lungs clear, heart regular in rate and rhythm Abdomen soft with active bowel sounds Right groin incision is intact with dermabond present. No erythema, drainage, or significant edema. JP drain stripped with serous output. Around 30 cc output emptied. Insertion site without signs of infection. JP drain tubing shortened per pt request. No lower extremity edema  Assessment/Plan: 68 year old female with vulvar cancer s/p right inguinal lymph node dissection, right vulvar biopsy on 08/03/23 with Dr. Wiley Hanger. She is doing well post-operatively. JP and right inguinal incision assessed. Patient advised to continue monitoring drain output. Follow up appointment arranged for one week from now. She will call sooner for decreased output, any needs, or new symptoms. Reportable signs and symptoms reviewed.

## 2023-08-09 NOTE — Patient Instructions (Signed)
 You are healing well. Continue monitoring drain output several times and recording output. Continue with daily dressing changes around the drain.   We will plan for a follow up visit in one week. This is flexible and can be moved sooner if the drainage decreases and it is time for the drain to be removed.   Please call the office for any new symptoms such as fever, pain, cloudy output from drain, redness around incision or drain.

## 2023-08-10 ENCOUNTER — Telehealth: Payer: Self-pay | Admitting: *Deleted

## 2023-08-10 ENCOUNTER — Ambulatory Visit: Admitting: Radiation Oncology

## 2023-08-10 ENCOUNTER — Ambulatory Visit

## 2023-08-10 NOTE — Telephone Encounter (Signed)
 Spoke with Ms. Guardino and relayed message from Vira Grieves, NP The area looks like it is superficially open at the skin surface. Recommendations would be to place a gauze in that fold (discussed yesterday) and take it easy. This will heal from the inside out and she has stiches under the skin that she cannot see. If the opening opens more, please call the office. Pt verbalized understanding and thanked the office for calling.

## 2023-08-14 ENCOUNTER — Emergency Department (HOSPITAL_COMMUNITY)
Admission: EM | Admit: 2023-08-14 | Discharge: 2023-08-14 | Disposition: A | Discharge status: Home / Self Care | Attending: Emergency Medicine | Admitting: Emergency Medicine

## 2023-08-14 ENCOUNTER — Other Ambulatory Visit: Payer: Self-pay

## 2023-08-14 ENCOUNTER — Encounter (HOSPITAL_COMMUNITY): Payer: Self-pay | Admitting: Emergency Medicine

## 2023-08-14 DIAGNOSIS — Z48817 Encounter for surgical aftercare following surgery on the skin and subcutaneous tissue: Secondary | ICD-10-CM | POA: Insufficient documentation

## 2023-08-14 DIAGNOSIS — Z4889 Encounter for other specified surgical aftercare: Secondary | ICD-10-CM

## 2023-08-14 NOTE — ED Triage Notes (Signed)
 Pt presents to the ED via POV with complaints of post-op complication following a R inguinal lymphnode dissection on 5/14. She notes her drain "fell out" tonight. She notes that the drain was supposed to be removed on 5/27 tentatively but she was started on amoxicillin  yesterday due to discolored drainage in her drain. A&Ox4 at this time. Denies fevers, chills, CP or SOB.

## 2023-08-14 NOTE — ED Notes (Signed)
 Cleaned with chloraprep and placed bulky dressing with tape over drainage site. Patient following up with doctor on Tuesday per patient. Advised patient to keep area clean and dry in between dressing changes.

## 2023-08-14 NOTE — ED Provider Notes (Signed)
 [place] EMERGENCY DEPARTMENT AT [facility] Provider Note   CSN: [id number] Arrival date & time: 08/14/23  0245     History  Chief Complaint  Patient presents with   Post-op Problem    Leslie [name] is a 68 y.o. female.  The history is provided by the patient.  Illness Location:  Right groin Quality:  JP drain site, JP drain has fallen out Severity:  Moderate Onset quality:  Sudden Duration: hours. Timing:  Constant Progression:  Unchanged Chronicity:  New Context:  Had a lymphadenectomy by Dr. Orvil Bland on 5/14 and was scheduled to have drain removal on Tuesday but it fell out tonight. some serosanguinous drainage Relieved by:  Nothing Worsened by:  Nothing Ineffective treatments:  None Associated symptoms: no fever and no vomiting   Risk factors:  Post operative case      Home Medications Prior to Admission medications   Medication Sig Start Date End Date Taking? Authorizing Provider  acetaminophen  (TYLENOL ) 500 MG tablet Take 500 mg by mouth every 6 (six) hours as needed for moderate pain (pain score 4-6).    [provider]  BIOTIN PO Take 6,000 mcg by mouth daily.    [provider]  Calcium  Carb-Cholecalciferol (CALCIUM  500 + D PO) Take 2 tablets by mouth daily.    [provider]  Cholecalciferol 50 MCG (2000 UT) TABS Take 4,000 Units by mouth daily.    [provider]  clobetasol  ointment (TEMOVATE ) 0.05 % Apply to affected area every night for 4 weeks, then every other day for 4 weeks and then twice a week for 4 weeks or until resolution. Patient taking differently: Apply 1 Application topically daily. 11/03/22   Ajewole, Christana, MD  Ferrous Sulfate (IRON PO) Take 26 mg by mouth daily.    [provider]  FLUoxetine  (PROZAC ) 40 MG capsule Take 40 mg by mouth at bedtime. 12/15/22   [provider]  hydrOXYzine  (ATARAX ) 25 MG tablet TAKE 1 TABLET(25 MG) BY MOUTH AT BEDTIME AS NEEDED FOR  ITCHING Patient taking differently: Take 25 mg by mouth at bedtime as needed for itching. 04/04/23   Ajewole, Christana, MD  levothyroxine  (SYNTHROID ) 137 MCG tablet Take 137 mcg by mouth daily before breakfast.    [provider]  loratadine (CLARITIN) 10 MG tablet Take 10 mg by mouth daily as needed for allergies.    [provider]  mineral oil-hydrophilic petrolatum  (AQUAPHOR) ointment Apply topically as needed for dry skin. 11/03/22   Ajewole, Christana, MD  Multiple Vitamin (MULTIVITAMIN) capsule Take 2 capsules by mouth daily.    [provider]  traMADol  (ULTRAM ) 50 MG tablet Take 1 tablet (50 mg total) by mouth every 6 (six) hours as needed for severe pain (pain score 7-10). For AFTER surgery only, do not take and drive 4/0/98   Cross, Melissa D, NP      Allergies    Patient has no known allergies.    Review of Systems   Review of Systems  Constitutional:  Negative for fever.  HENT:  Negative for facial swelling.   Gastrointestinal:  Negative for vomiting.  Skin:  Positive for wound.  All other systems reviewed and are negative.   Physical Exam Updated Vital Signs BP 129/73 (BP Location: Right Wrist)   Pulse 71   Temp 98.1 F (36.7 C) (Oral)   Resp 18   Ht 5\' 2"  (1.575 m)   Wt 112.5 kg   SpO2 99%   BMI 45.36 kg/m  Physical Exam Vitals and nursing note reviewed.  Constitutional:      General: She is not in acute distress.    Appearance: She is well-developed.  HENT:     Head: Normocephalic and atraumatic.     Nose: Nose normal.  Eyes:     Pupils: Pupils are equal, round, and reactive to light.  Cardiovascular:     Rate and Rhythm: Normal rate and regular rhythm.     Pulses: Normal pulses.     Heart sounds: Normal heart sounds.  Pulmonary:     Effort: Pulmonary effort is normal. No respiratory distress.     Breath sounds: Normal breath sounds.  Abdominal:     General: Bowel sounds are normal. There is no distension.     Palpations:  Abdomen is soft.     Tenderness: There is no abdominal tenderness. There is no guarding or rebound.       Comments: Scant serosanguinous drainage on undergarments   Musculoskeletal:        General: Normal range of motion.     Cervical back: Neck supple.  Skin:    General: Skin is dry.     Capillary Refill: Capillary refill takes less than 2 seconds.     Findings: No erythema or rash.  Neurological:     General: No focal deficit present.     Deep Tendon Reflexes: Reflexes normal.  Psychiatric:        Mood and Affect: Mood normal.     ED Results / Procedures / Treatments   Labs (all labs ordered are listed, but only abnormal results are displayed) Labs Reviewed - No data to display  EKG None  Radiology No results found.  Procedures Procedures    Medications Ordered in ED Medications - No data to display  ED Course/ Medical Decision Making/ A&P                                 Medical Decision Making Patient who is s/p lymphadenectomy on 5.14 with drain that fell out this evening   Amount and/or Complexity of Data Reviewed External Data Reviewed: notes.    Details: Previous notes reviewed  Discussion of management or test interpretation with external provider(s): 4:33 AM Dr. Ardis Becton via phone.  Clean and dress the area and then will discuss further drainage with the patient at the appointment on Tuesday    Risk Risk Details: Wound cleansed in the ED and covered with sterile dressing.  Stable for discharge.  Strict returns.      Final Clinical Impression(s) / ED Diagnoses Final diagnoses:  Encounter for postoperative wound check    No signs of systemic illness or infection. The patient is nontoxic-appearing on exam and vital signs are within normal limits.  I have reviewed the triage vital signs and the nursing notes. Pertinent labs & imaging results that were available during my care of the patient were reviewed by me and considered in my medical decision making  (see chart for details). After history, exam, and medical workup I feel the patient has been appropriately medically screened and is safe for discharge home. Pertinent diagnoses were discussed with the patient. Patient was given return precautions.  Rx / DC Orders ED Discharge Orders     None         Domani Bakos, MD 08/14/23 [phone]

## 2023-08-16 ENCOUNTER — Inpatient Hospital Stay

## 2023-08-16 ENCOUNTER — Ambulatory Visit (HOSPITAL_BASED_OUTPATIENT_CLINIC_OR_DEPARTMENT_OTHER)
Admission: RE | Admit: 2023-08-16 | Discharge: 2023-08-16 | Disposition: A | Discharge status: Home / Self Care | Source: Ambulatory Visit | Attending: Gynecologic Oncology | Admitting: Gynecologic Oncology

## 2023-08-16 ENCOUNTER — Telehealth: Payer: Self-pay | Admitting: *Deleted

## 2023-08-16 ENCOUNTER — Inpatient Hospital Stay (HOSPITAL_BASED_OUTPATIENT_CLINIC_OR_DEPARTMENT_OTHER): Admitting: Gynecologic Oncology

## 2023-08-16 VITALS — BP 145/66 | HR 73 | Temp 98.4°F | Resp 18 | Wt 252.0 lb

## 2023-08-16 DIAGNOSIS — L539 Erythematous condition, unspecified: Secondary | ICD-10-CM | POA: Insufficient documentation

## 2023-08-16 DIAGNOSIS — T148XXA Other injury of unspecified body region, initial encounter: Secondary | ICD-10-CM | POA: Insufficient documentation

## 2023-08-16 DIAGNOSIS — T8149XA Infection following a procedure, other surgical site, initial encounter: Secondary | ICD-10-CM | POA: Insufficient documentation

## 2023-08-16 DIAGNOSIS — Z9889 Other specified postprocedural states: Secondary | ICD-10-CM

## 2023-08-16 DIAGNOSIS — Z9079 Acquired absence of other genital organ(s): Secondary | ICD-10-CM | POA: Insufficient documentation

## 2023-08-16 DIAGNOSIS — C519 Malignant neoplasm of vulva, unspecified: Secondary | ICD-10-CM | POA: Insufficient documentation

## 2023-08-16 DIAGNOSIS — C774 Secondary and unspecified malignant neoplasm of inguinal and lower limb lymph nodes: Secondary | ICD-10-CM

## 2023-08-16 DIAGNOSIS — T8189XD Other complications of procedures, not elsewhere classified, subsequent encounter: Secondary | ICD-10-CM | POA: Insufficient documentation

## 2023-08-16 LAB — CBC WITH DIFFERENTIAL (CANCER CENTER ONLY)
Abs Immature Granulocytes: 0.05 10*3/uL (ref 0.00–0.07)
Basophils Absolute: 0.1 10*3/uL (ref 0.0–0.1)
Basophils Relative: 0 %
Eosinophils Absolute: 0.1 10*3/uL (ref 0.0–0.5)
Eosinophils Relative: 1 %
HCT: 34.7 % — ABNORMAL LOW (ref 36.0–46.0)
Hemoglobin: 11.1 g/dL — ABNORMAL LOW (ref 12.0–15.0)
Immature Granulocytes: 0 %
Lymphocytes Relative: 10 %
Lymphs Abs: 1.3 10*3/uL (ref 0.7–4.0)
MCH: 25.9 pg — ABNORMAL LOW (ref 26.0–34.0)
MCHC: 32 g/dL (ref 30.0–36.0)
MCV: 81.1 fL (ref 80.0–100.0)
Monocytes Absolute: 1.5 10*3/uL — ABNORMAL HIGH (ref 0.1–1.0)
Monocytes Relative: 11 %
Neutro Abs: 10.6 10*3/uL — ABNORMAL HIGH (ref 1.7–7.7)
Neutrophils Relative %: 78 %
Platelet Count: 323 10*3/uL (ref 150–400)
RBC: 4.28 MIL/uL (ref 3.87–5.11)
RDW: 13.4 % (ref 11.5–15.5)
WBC Count: 13.6 10*3/uL — ABNORMAL HIGH (ref 4.0–10.5)
nRBC: 0 % (ref 0.0–0.2)

## 2023-08-16 MED ORDER — SULFAMETHOXAZOLE-TRIMETHOPRIM 800-160 MG PO TABS: 1.0000 | ORAL_TABLET | Freq: Two times a day (BID) | ORAL | 0 refills | Status: DC

## 2023-08-16 NOTE — Telephone Encounter (Signed)
 Spoke with Ms. Leslie [name] and relayed message from Vira Grieves, NP patient's CBC is back. White blood cell count is at 13.6 (normal range 4-10.5) since she is feeling well with no fever, continue with the plan for starting the bactrim today. Pt can eat now, spoke with ultrasound tech. Will start with the ultrasound and based on the results, may end up having to get a CT, if white count remains elevated. Pt verbalized understanding and thanked the office for calling.

## 2023-08-16 NOTE — Telephone Encounter (Signed)
-----   Message from Suellyn Emory sent at 08/16/2023 12:47 PM EDT ----- The patient's CBC is back. Her white blood cell count is at 13.6 with normal range (4-10.5). Since she is feeling well with no fever, I think we can continue with the plan for starting the bactrim  antibiotics today.  She is fine to go ahead and eat now. I was able to finally get through to the US  tech there. We will start with the ultrasound and based on the results, we may end up having to get a CT, if white count remains elevated etc.

## 2023-08-16 NOTE — Patient Instructions (Addendum)
-  You can start taking bactrim , antibiotic, twice daily. You can stop the amoxicillin . -Today we will check a CBC and contact you with the results. We also checked a wound culture from the drain site. This should come back in several days. -Plan on follow up this Friday and follow up as scheduled with Dr. Orvil Bland.  -Please call for any needs, questions, or new/persistent symptoms such as fever, wound redness, drainage, feeling poorly.  -Plan on having an ultrasound of the abdominal wall this afternoon at [facility]. They are recommending nothing to eat or drink until the scan. We will call US  to confirm this. -Please call the office for any needs or concerns.

## 2023-08-16 NOTE — Progress Notes (Signed)
 Radiation Oncology         [phone] ________________________________  Initial {In/Out}patient Consultation  Name: Leslie [name] MRN: [id number]  Date: 08/17/2023  DOB: [birth year]  [id number], [place], PA  Suzi Essex, MD   REFERRING PHYSICIAN: Suzi Essex, MD  DIAGNOSIS: There were no encounter diagnoses.  Vulvar Carcinoma  HISTORY OF PRESENT ILLNESS::Leslie [name] is a 68 y.o. female who is accompanied by ***. she is seen as a courtesy of Dr. Orvil Bland for an opinion concerning radiation therapy as part of management for her recently diagnosed endometrial cancer.   The patient presented to her GYN on 04/13/23 with complains of vulvar pruritus. To further investigate her symptoms, she underwent a cervical pap smear showing erythema in groin folds and along inner labia majora, atrophic vaginal introitus with atrophic urethral meatus and a thickened and friable clitoral hood, 1.5cm ulcerated lesion on upper inner right labia. Exam did however show a  normal appearing cervix.   Patient underwent a left labial biopsy, right labial biopsy, clitoral hood biopsy on the same date. 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 Left labia biopsy showed high grade squamous intraepithelial lesion (CIN2-3, high grade dysplasia).  In light of findings, she underwent a PET scan on 05/05/23 showing marked hypermetabolism associated with the vulvar region consistent with the patient's known carcinoma and moderate hypermetabolism in the anal region. No findings for metastatic disease involving the chest, abdomen or pelvis were indicated on scan.   She then underwent a partial modified radical vulvectomy on 05/09/23 under the care of Dr. Orvil Bland. 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 Negative for angiolymphatic invasion and one incidental benign lymph node.          Patient was then referred for a consultation with Dr. Devon Fogo of the [facility] radiation  oncology team. During that visit, she recommended undergoing 60 Gy delivered in 30 fractions following Inguinal lymph node dissection to determine lymph involvement.   Subsequently, she underwent a right inguinal lymph node dissection, right vulvar biopsy on 08/03/23 with Dr. Wiley Hanger. 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   Vulva cancer [facility])     PAST SURGICAL HISTORY: Past Surgical History:  Procedure Laterality Date   BREAST REDUCTION SURGERY  2001   COLONOSCOPY WITH PROPOFOL   06/04/2013   dr Willy Harvest   DILATION AND CURETTAGE OF UTERUS     yrs ago   INGUINAL LYMPHADENECTOMY Right 08/03/2023   Procedure: LYMPHADENECTOMY, INGUINAL, OPEN; VULVA BIOPSY;  Surgeon: Suzi Essex, MD;  Location: [facility];  Service: Gynecology;  Laterality: Right;   LAPAROSCOPIC GASTRIC BANDING WITH HIATAL HERNIA REPAIR  03/28/2006   @ [facility] by dr b. Alray Askew   LAPAROSCOPIC ROUX-EN-Y GASTRIC  BYPASS WITH UPPER ENDOSCOPY AND REMOVAL OF LAP BAND  02/26/2019   @ [facility] by dr j. dasher   LESION REMOVAL N/A 12/30/2022   Procedure: EXCISION BIOPSY PERIANAL LESION;  Surgeon: Joyce Nixon, MD;  Location: [facility];  Service: General;  Laterality: N/A;   VULVA Asher Blade BIOPSY N/A 04/13/2023   Procedure: VULVAR BIOPSY WITH PAPSMERE;  Surgeon: Kiki Pelton, MD;  Location: [facility];  Service: Gynecology;  Laterality: N/A;   VULVECTOMY N/A 05/09/2023   Procedure: PARTIAL MODIFIED RADICAL VULVECTOMY;  Surgeon: Suzi Essex, MD;  Location: [facility];  Service: Gynecology;  Laterality: N/A;   WISDOM TOOTH EXTRACTION      FAMILY HISTORY:  Family History  Problem Relation Age of Onset   Heart attack Mother    Breast cancer Mother    Heart attack Father    Colon cancer Maternal Grandmother    Diabetes Neg Hx    Hyperlipidemia Neg Hx    Hypertension Neg Hx    Sudden death Neg Hx    Prostate cancer Neg Hx    Pancreatic cancer Neg Hx    Ovarian cancer Neg Hx    Endometrial cancer Neg Hx     SOCIAL HISTORY:  Social History   Tobacco Use   Smoking status: Former    Types: Cigarettes   Smokeless tobacco: Never   Tobacco comments:    04-12-2023  quit smoking 2000,  started age 68  Vaping Use   Vaping status: Never Used  Substance Use Topics   Alcohol use: Not Currently    Comment: rare   Drug use: Never    ALLERGIES: No Known Allergies  MEDICATIONS:  Current Outpatient Medications  Medication Sig Dispense Refill   acetaminophen  (TYLENOL ) 500 MG tablet Take 500 mg by mouth every 6 (six) hours as needed for moderate pain (pain score 4-6).     BIOTIN PO Take 6,000 mcg by mouth daily.     Calcium  Carb-Cholecalciferol (CALCIUM  500 + D PO) Take 2 tablets by mouth daily.     Cholecalciferol 50 MCG (2000 UT) TABS Take 4,000 Units by mouth daily.     clobetasol  ointment (TEMOVATE ) 0.05 % Apply to affected area every night for 4 weeks, then  every other day for 4 weeks and then twice a week for 4 weeks or until resolution. (Patient taking differently: Apply 1 Application topically daily.) 30 g 5   Ferrous Sulfate (IRON PO) Take 26 mg by mouth daily.     FLUoxetine  (PROZAC ) 40 MG capsule Take 40 mg by mouth at bedtime.     hydrOXYzine  (ATARAX ) 25 MG tablet TAKE 1 TABLET(25 MG) BY MOUTH AT BEDTIME AS NEEDED FOR ITCHING (Patient taking differently: Take 25 mg by mouth at bedtime as needed for itching.) 30 tablet 2   levothyroxine  (SYNTHROID ) 137 MCG tablet Take 137 mcg by mouth daily before breakfast.     loratadine (CLARITIN) 10 MG tablet Take 10 mg by mouth daily as needed for allergies.     mineral oil-hydrophilic petrolatum  (AQUAPHOR) ointment Apply topically as needed for dry skin. 420 g 0   Multiple Vitamin (MULTIVITAMIN) capsule Take 2 capsules by mouth daily.     traMADol  (ULTRAM ) 50 MG tablet Take 1 tablet (50 mg total)  by mouth every 6 (six) hours as needed for severe pain (pain score 7-10). For AFTER surgery only, do not take and drive 10 tablet 0   No current facility-administered medications for this encounter.    REVIEW OF SYSTEMS:  A 10+ POINT REVIEW OF SYSTEMS WAS OBTAINED including neurology, dermatology, psychiatry, cardiac, respiratory, lymph, extremities, GI, GU, musculoskeletal, constitutional, reproductive, HEENT. ***   PHYSICAL EXAM:  vitals were not taken for this visit.   General: Alert and oriented, in no acute distress HEENT: Head is normocephalic. Extraocular movements are intact. Oropharynx is clear. Neck: Neck is supple, no palpable cervical or supraclavicular lymphadenopathy. Heart: Regular in rate and rhythm with no murmurs, rubs, or gallops. Chest: Clear to auscultation bilaterally, with no rhonchi, wheezes, or rales. Abdomen: Soft, nontender, nondistended, with no rigidity or guarding. Extremities: No cyanosis or edema. Lymphatics: see Neck Exam Skin: No concerning lesions. Musculoskeletal:  symmetric strength and muscle tone throughout. Neurologic: Cranial nerves II through XII are grossly intact. No obvious focalities. Speech is fluent. Coordination is intact. Psychiatric: Judgment and insight are intact. Affect is appropriate. ***  ECOG = ***  0 - Asymptomatic (Fully active, able to carry on all predisease activities without restriction)  1 - Symptomatic but completely ambulatory (Restricted in physically strenuous activity but ambulatory and able to carry out work of a light or sedentary nature. For example, light housework, office work)  2 - Symptomatic, <50% in bed during the day (Ambulatory and capable of all self care but unable to carry out any work activities. Up and about more than 50% of waking hours)  3 - Symptomatic, >50% in bed, but not bedbound (Capable of only limited self-care, confined to bed or chair 50% or more of waking hours)  4 - Bedbound (Completely disabled. Cannot carry on any self-care. Totally confined to bed or chair)  5 - Death   Aurea Blossom MM, Creech RH, Tormey DC, et al. [phone]). "Toxicity and response criteria of the [facility]". Am. Hillard Lowes. Oncol. 5 (6): 649-55  LABORATORY DATA:  Lab Results  Component Value Date   WBC 8.0 08/04/2023   HGB 11.1 (L) 08/04/2023   HCT 36.2 08/04/2023   MCV 86.0 08/04/2023   PLT 245 08/04/2023   Lab Results  Component Value Date   NA 136 08/04/2023   K 4.5 08/04/2023   CL 107 08/04/2023   CO2 23 08/04/2023   GLUCOSE 109 (H) 08/04/2023   BUN 15 08/04/2023   CREATININE 0.52 08/04/2023   CALCIUM  8.3 (L) 08/04/2023      RADIOGRAPHY: No results found.    IMPRESSION: Vulvar Carcinoma  ***  Today, I talked to the patient and family about the findings and work-up thus far.  We discussed the natural history of *** and general treatment, highlighting the role of radiotherapy in the management.  We discussed the available radiation techniques, and focused on the details of logistics  and delivery.  We reviewed the anticipated acute and late sequelae associated with radiation in this setting.  The patient was encouraged to ask questions that I answered to the best of my ability. *** A patient consent form was discussed and signed.  We retained a copy for our records.  The patient would like to proceed with radiation and will be scheduled for CT simulation.  PLAN: ***    *** minutes of total time was spent for this patient encounter, including preparation, face-to-face counseling with the patient and coordination of care, physical exam, and  documentation of the encounter.   ------------------------------------------------  Noralee Beam, PhD, MD  This document serves as a record of services personally performed by Retta Caster, MD. It was created on his behalf by Lucky Sable, a trained medical scribe. The creation of this record is based on the scribe's personal observations and the provider's statements to them. This document has been checked and approved by the attending provider.

## 2023-08-16 NOTE — Progress Notes (Signed)
 GYN Location of Tumor / Histology: Right inguinal lymph node   Kriste Petite presented with symptoms of: vulvar pruritus   Biopsies revealed:      Past/Anticipated interventions by Gyn/Onc surgery, if any:    Past/Anticipated interventions by medical oncology, if any: ***  Weight changes, if any: {:18581}  Bowel/Bladder complaints, if any: {yes no:314532}, {Blank single:19197::"diarrhea","constipation","urinary frequency","burning","trouble emptying bladder"," "}  Nausea/Vomiting, if any: {:18581}  Pain issues, if any:  {:18581}  SAFETY ISSUES: Prior radiation? {:18581} Pacemaker/ICD? {:18581} Possible current pregnancy? {:18581} Is the patient on methotrexate? {:18581}  Current Complaints / other details:  ***

## 2023-08-16 NOTE — Progress Notes (Signed)
 GYN Oncology Follow Up (s/p recent ER visit)  Date: 08/16/2023  Treatment History:     Oncology History Overview Note   Presented with vulvar pruritus    Vulvar cancer (HCC)   04/13/2023 Surgery     EUA, vulvar biopsy, pap test FINDINGS:  Erythema in groin folds and along inner labia majora, atrophic vaginal introitus with atrophic urethral meatus, normal appearing cervix, thickened and friable clitoral hood, 1.5cm ulcerated lesion on upper inner right labia  SPECIMENS: left labial biopsy, right labial biopsy, clitoral hood biopsy, cervical pap smear      04/13/2023 Initial Biopsy     A. LABIA, RIGHT INNER, BIOPSY:  - Invasive moderately differentiated squamous cell carcinoma, see  comment  B. CLITORAL, BIOPSY:  - Invasive moderately differentiated squamous cell carcinoma, see  comment  C. 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  Moderate hypermetabolism in the anal region, SUV max 7.7.  No pelvic adenopathy.  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 Intradermal injections of Tc-63m were made surrounding the patient's vulvar lesion bilaterally. Subcutaneous injections of indocyanine green were also made surrounding the patient's vulvar lesion. Successful sentinel lymph node mapping and biopsy of inguinal nodes bilaterally which were noted to be both hot, green and bright. Biopsies taken of granulation tissue and of prior area of vulvar displasia. CO2 laser ablation performed of entire area down to a second surgical layer. Silvadene cream applied. Good hemostasis at end of case.    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 Wiley Hanger. Final pathology returned with: A. RIGHT INGUINAL LYMPH NODE RESECTION:  - Metastatic squamous cell carcinoma involving one of  four lymph nodes (0/4)  - Metastatic carcinoma measures approximately 0.5 cm  - No evidence of extranodal extension  - Previous procedure-related changes   B.  VULVA, RIGHT 9:00, BIOPSY:  - Fragments of stroma with acute and chronic inflammation  - Residual mucosa is not identified  - No evidence of malignancy   Interval: Patient presents today to the office for evaluation after having her JP drain accidentally pulled out Saturday, May 24 with recent ER visit afterwards.    The patient's daughter contacted the after hours service in the late evening of Aug 12, 2023.  The patient was reported as having cloudy drainage from the JP drain with discharge at the drain site. There is an additional note from Aug 13, 2023 in the am with the skin around the JP drain appearing infected with spreading redness. Reported no fever and the pain being worse around the drain with drainage being brownish-orange.  At that time the MD on-call at [facility], Dr. Wannetta Gutting, was contacted.  The patient was advised to watch for worsening symptoms and to go to the ER if this occurred.  Dr. Ardis Becton also sent in a prescription for antibiotics (amoxicillin  per pt) to the patient's pharmacy.    The patient reached out to after hours service on Aug 14, 2023 in the early am due to the JP drain coming out.  The patient was advised at that time to go to the ER for further evaluation. She was seen in the [facility] ER and was felt to be stable for discharge.  She currently denies having any fever but states her temperature has been hanging around 98 and is typically in the 97 range.  She has been taking ibuprofen  and Tylenol  for pain.  She reports the drain as having a cloudy drainage with pus around the site over the weekend.  She states when she was getting ready for bed and brushing her teeth on Saturday night, she looked down and the drain was on the floor.  She denies any severe pain when the drain came out.  She is eating and drinking without difficulty.  No nausea or vomiting reported.  Bladder functioning without difficulty.  Having normal bowel movements.  Has mild vulvar soreness at the site where she  was previously lasered with no excessive drainage from this area.  No lower extremity edema reported.  She has been changing the dressing around the previous drain site and having moderate drainage.  There is also edema and redness on the right groin moving to the mons.   JP output: 08/09/23: 120 cc orange 08/10/23: 160 cc red 08/12/23: 40 cc brown 08/13/23: 60 cc brown  Exam: Alert, oriented, in no acute distress. Does not appear ill-appearing or toxic. Lungs clear, heart regular in rate and rhythm Dressing over right inguinal/lower abd previous JP site is saturated with cloudy serosanguinous drainage. No lower extremity edema JP drain outside of the body currently in closed bag with pink cloudy drainage in the bulb with tubing completely intact with no evidence of disruption in the entirety of the drain. With palpation around previous JP site, drainage expressed from opening. Concern for underlying fluid collection. See media for photos. Erythema marked. Fluctuance palpated in the right lower abdominal/pannus. Culture obtained from previous JP site that is draining.  Assessment/Plan: 68 year old female with vulvar cancer s/p right inguinal lymph node dissection, right vulvar biopsy on 08/03/23 with Dr. Wiley Hanger. Concern for worsening post-operative infection on today's exam. Given the  fluctuance/firmness palpated around the previous right JP site moving to the lower pannus and mons with increased warmth with erythema along with cloudy drainage, plan for a CBC today, superficial abdominal ultrasound to evaluate abdominal wall/inguinal area for fluid collection/abscess since JP removed (pulled out) prematurely. Patient advised to stop taking amoxicillin  and start taking bactrim  as soon as possible. Wound culture obtained today from open/draining, previous JP drain site. Reportable signs and symptoms reviewed. Follow up made for later in the week but advised if not progressing or for worse  symptoms, to call the office right away.  Leslie Grieves NP [facility] GYN Oncology

## 2023-08-17 ENCOUNTER — Ambulatory Visit
Admission: RE | Admit: 2023-08-17 | Discharge: 2023-08-17 | Disposition: A | Discharge status: Home / Self Care | Source: Ambulatory Visit | Attending: Radiation Oncology | Admitting: Radiation Oncology

## 2023-08-17 ENCOUNTER — Ambulatory Visit: Payer: Self-pay | Admitting: *Deleted

## 2023-08-17 ENCOUNTER — Encounter: Payer: Self-pay | Admitting: Radiation Oncology

## 2023-08-17 VITALS — BP 136/65 | HR 71 | Temp 97.2°F | Resp 20 | Ht 62.0 in | Wt 253.6 lb

## 2023-08-17 DIAGNOSIS — C518 Malignant neoplasm of overlapping sites of vulva: Secondary | ICD-10-CM

## 2023-08-17 DIAGNOSIS — C519 Malignant neoplasm of vulva, unspecified: Secondary | ICD-10-CM

## 2023-08-17 NOTE — Telephone Encounter (Signed)
-----   Message from Suellyn Emory sent at 08/17/2023  7:49 AM EDT ----- Please call this am and see how she is feeling. Any fever etc? On US  there is no significant fluid collection to drain or have a drain placed in at this time. How is the abdominal firmness and redness. Continue with plan for bactrim and follow up at end of week. She needs to call for worsening symptoms. ----- Message ----- From: Interface, Rad Results In Sent: 08/16/2023   6:26 PM EDT To: Suellyn Emory, NP

## 2023-08-17 NOTE — Telephone Encounter (Signed)
 Spoke with Leslie [name] this morning and relayed message from Vira Grieves, NP on ultrasound there is no significant fluid collection to drain or have a drain placed in at this time.   Pt denies fever and reports no abdominal firmness and the redness has significantly improved. Advised to continue taking bactrim and follow up at the end of the week. Pt reminded of her follow up appt. On Friday at 2 pm and to call with worsening symptoms. Pt verbalized understanding and thanked the office for calling.

## 2023-08-18 LAB — AEROBIC CULTURE W GRAM STAIN (SUPERFICIAL SPECIMEN): Gram Stain: NONE SEEN

## 2023-08-19 ENCOUNTER — Telehealth: Payer: Self-pay | Admitting: *Deleted

## 2023-08-19 ENCOUNTER — Inpatient Hospital Stay

## 2023-08-19 ENCOUNTER — Inpatient Hospital Stay (HOSPITAL_BASED_OUTPATIENT_CLINIC_OR_DEPARTMENT_OTHER): Admitting: Gynecologic Oncology

## 2023-08-19 VITALS — BP 138/53 | HR 60 | Temp 97.5°F | Resp 18 | Ht 62.0 in | Wt 246.8 lb

## 2023-08-19 DIAGNOSIS — C774 Secondary and unspecified malignant neoplasm of inguinal and lower limb lymph nodes: Secondary | ICD-10-CM

## 2023-08-19 DIAGNOSIS — L539 Erythematous condition, unspecified: Secondary | ICD-10-CM

## 2023-08-19 DIAGNOSIS — T8189XD Other complications of procedures, not elsewhere classified, subsequent encounter: Secondary | ICD-10-CM

## 2023-08-19 DIAGNOSIS — L03314 Cellulitis of groin: Secondary | ICD-10-CM

## 2023-08-19 DIAGNOSIS — Z9889 Other specified postprocedural states: Secondary | ICD-10-CM

## 2023-08-19 DIAGNOSIS — L03311 Cellulitis of abdominal wall: Secondary | ICD-10-CM

## 2023-08-19 DIAGNOSIS — C519 Malignant neoplasm of vulva, unspecified: Secondary | ICD-10-CM

## 2023-08-19 DIAGNOSIS — T8149XA Infection following a procedure, other surgical site, initial encounter: Secondary | ICD-10-CM | POA: Insufficient documentation

## 2023-08-19 LAB — CBC WITH DIFFERENTIAL (CANCER CENTER ONLY)
Abs Immature Granulocytes: 0.11 10*3/uL — ABNORMAL HIGH (ref 0.00–0.07)
Basophils Absolute: 0.1 10*3/uL (ref 0.0–0.1)
Basophils Relative: 1 %
Eosinophils Absolute: 0.2 10*3/uL (ref 0.0–0.5)
Eosinophils Relative: 2 %
HCT: 37.5 % (ref 36.0–46.0)
Hemoglobin: 11.8 g/dL — ABNORMAL LOW (ref 12.0–15.0)
Immature Granulocytes: 1 %
Lymphocytes Relative: 11 %
Lymphs Abs: 0.8 10*3/uL (ref 0.7–4.0)
MCH: 25.7 pg — ABNORMAL LOW (ref 26.0–34.0)
MCHC: 31.5 g/dL (ref 30.0–36.0)
MCV: 81.7 fL (ref 80.0–100.0)
Monocytes Absolute: 0.8 10*3/uL (ref 0.1–1.0)
Monocytes Relative: 11 %
Neutro Abs: 5.6 10*3/uL (ref 1.7–7.7)
Neutrophils Relative %: 74 %
Platelet Count: 426 10*3/uL — ABNORMAL HIGH (ref 150–400)
RBC: 4.59 MIL/uL (ref 3.87–5.11)
RDW: 13.6 % (ref 11.5–15.5)
WBC Count: 7.6 10*3/uL (ref 4.0–10.5)
nRBC: 0 % (ref 0.0–0.2)

## 2023-08-19 NOTE — Patient Instructions (Addendum)
 We will check a CBC and let you know the results.   You are healing well. Continue taking the bactrim . Plan to follow up as scheduled or sooner if needed or new symptoms.

## 2023-08-19 NOTE — Telephone Encounter (Signed)
-----   Message from Suellyn Emory sent at 08/19/2023  4:31 PM EDT ----- Please let her know her white blood cell count is down to 7.6. Good news! We will see how things are at the completion of antibiotics and see if the course needs to be extended. Please find out when she is to finish and have her on the call list for several days after.   Thanks

## 2023-08-19 NOTE — Telephone Encounter (Signed)
 Spoke with Leslie [name] and relayed message from Vira Grieves, NP that patient's white blood cell count is down to 7.6. Good News! We will see how things are at the completion of antibiotics and see if the course needs to be extended. Advised patient that the office would call once she has completed her antibiotics to check in. Pt verbalized understanding and thanked the office for calling.

## 2023-08-24 NOTE — Progress Notes (Signed)
 GYN Oncology Follow Up  Date: 08/19/2023  Treatment History:     Oncology History Overview Note   Presented with vulvar pruritus    Vulvar cancer (HCC)   04/13/2023 Surgery     EUA, vulvar biopsy, pap test FINDINGS:  Erythema in groin folds and along inner labia majora, atrophic vaginal introitus with atrophic urethral meatus, normal appearing cervix, thickened and friable clitoral hood, 1.5cm ulcerated lesion on upper inner right labia  SPECIMENS: left labial biopsy, right labial biopsy, clitoral hood biopsy, cervical pap smear      04/13/2023 Initial Biopsy     A. LABIA, RIGHT INNER, BIOPSY:  - Invasive moderately differentiated squamous cell carcinoma, see  comment  B. CLITORAL, BIOPSY:  - Invasive moderately differentiated squamous cell carcinoma, see  comment  C. 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  Moderate hypermetabolism in the anal region, SUV max 7.7.  No pelvic adenopathy.  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 Intradermal injections of Tc-55m were made surrounding the patient's vulvar lesion bilaterally. Subcutaneous injections of indocyanine green were also made surrounding the patient's vulvar lesion. Successful sentinel lymph node mapping and biopsy of inguinal nodes bilaterally which were noted to be both hot, green and bright. Biopsies taken of granulation tissue and of prior area of vulvar displasia. CO2 laser ablation performed of entire area down to a second surgical layer. Silvadene cream applied. Good hemostasis at end of case.    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 Wiley Hanger. Final pathology returned with: A. RIGHT INGUINAL LYMPH NODE RESECTION:  - Metastatic squamous cell carcinoma involving one of four lymph nodes (0/4)  -  Metastatic carcinoma measures approximately 0.5 cm  - No evidence of extranodal extension  - Previous procedure-related changes   B. VULVA, RIGHT 9:00, BIOPSY:  -  Fragments of stroma with acute and chronic inflammation  - Residual mucosa is not identified  - No evidence of malignancy   She was last seen on 08/16/2023 and was started on bactrim  at that time for worsening prev JP site erythema, drainage, edema. CBC and abdominal superficial ultrasound were obtained. WBC count was at 13.6 and ultrasound returned with no substantial surrounding fluid collections in the lower right abdomen/right inguinal region superficially. Wound culture also obtained returning with rare e coli, few staph aureus.  Interval: Patient presents today to the office for follow up evaluation of recent abdominal/inguinal cellulitis after having her JP drain accidentally pulled out Saturday, May 24 with recent ER visit afterwards. She has been tolerating the bactrim  well and feels the erythema/edema has improved. She is eating and drinking without difficulty.  No nausea or vomiting reported.  Bladder functioning without difficulty.  Having normal bowel movements. No lower extremity edema reported.  She has been changing the dressing around the previous drain site and still having some drainage. No fever or chills.   Exam: Alert, oriented, in no acute distress. Does not appear ill-appearing or toxic. Lungs clear, heart regular in rate and rhythm Dressing over right inguinal/lower abd previous JP site with serosanguinous drainage. Improved in appearance. No lower extremity edema With palpation around previous JP site, minimal drainage expressed from opening. Erythema, fluctuance, and edema palpated in the right lower abdominal/pannus on previous exam has improved significantly. Right inguinal incision is healing well without discharge.  Assessment/Plan: 68 year old female with vulvar cancer s/p right inguinal lymph node dissection, right vulvar biopsy on 08/03/23 with Dr. Wiley Hanger recently treated with bactrim  for lower abdominal/inguinal cellulitis. She is clinically improving. She  is advised to continue the bactrim  for the full course and to call the office for any needs. Plan for repeat CBC to follow up on elevated WBC count. She will be contacted with the results. Reportable signs and symptoms reviewed. Follow up will remain on 09/01/23 with Dr. Orvil Bland but advised if not progressing or for worse symptoms, to call the office right away to be seen as soon as possible.  Vira Grieves NP [facility] GYN Oncology

## 2023-08-25 ENCOUNTER — Telehealth: Payer: Self-pay | Admitting: *Deleted

## 2023-08-25 NOTE — Telephone Encounter (Addendum)
 Spoke with Leslie [name]. Pt states she has one more dose of her bactrim  this evening and then her antibiotics are complete. She is still experiencing a small amount of a "reddish" discharge, but denies redness, swelling, fever and pain. Pt reminded of follow up appt. With Dr. Orvil Bland on June 12 th. Advised patient to call the office before 6/12 if symptoms worsen and or for any concerns or needs. Pt verbalized understanding and thanked the office for calling.

## 2023-09-01 ENCOUNTER — Inpatient Hospital Stay: Attending: Gynecologic Oncology | Admitting: Gynecologic Oncology

## 2023-09-01 VITALS — BP 121/65 | HR 73 | Temp 98.5°F | Resp 16 | Ht 62.0 in | Wt 242.8 lb

## 2023-09-01 DIAGNOSIS — B3731 Acute candidiasis of vulva and vagina: Secondary | ICD-10-CM

## 2023-09-01 DIAGNOSIS — T8149XA Infection following a procedure, other surgical site, initial encounter: Secondary | ICD-10-CM | POA: Insufficient documentation

## 2023-09-01 DIAGNOSIS — C519 Malignant neoplasm of vulva, unspecified: Secondary | ICD-10-CM

## 2023-09-01 DIAGNOSIS — T148XXA Other injury of unspecified body region, initial encounter: Secondary | ICD-10-CM

## 2023-09-01 DIAGNOSIS — Z9079 Acquired absence of other genital organ(s): Secondary | ICD-10-CM | POA: Insufficient documentation

## 2023-09-01 DIAGNOSIS — Z8544 Personal history of malignant neoplasm of other female genital organs: Secondary | ICD-10-CM | POA: Insufficient documentation

## 2023-09-01 DIAGNOSIS — B379 Candidiasis, unspecified: Secondary | ICD-10-CM

## 2023-09-01 DIAGNOSIS — B372 Candidiasis of skin and nail: Secondary | ICD-10-CM | POA: Insufficient documentation

## 2023-09-01 DIAGNOSIS — Z7189 Other specified counseling: Secondary | ICD-10-CM

## 2023-09-01 DIAGNOSIS — C774 Secondary and unspecified malignant neoplasm of inguinal and lower limb lymph nodes: Secondary | ICD-10-CM | POA: Insufficient documentation

## 2023-09-01 DIAGNOSIS — Z9889 Other specified postprocedural states: Secondary | ICD-10-CM

## 2023-09-01 MED ORDER — NYSTATIN 100000 UNIT/GM EX POWD: 1.0000 | Freq: Three times a day (TID) | CUTANEOUS | 1 refills | Status: AC

## 2023-09-01 MED ORDER — FLUCONAZOLE 150 MG PO TABS: 150.0000 mg | ORAL_TABLET | Freq: Every day | ORAL | 1 refills | Status: DC

## 2023-09-01 NOTE — Progress Notes (Signed)
 Gynecologic Oncology Return Clinic Visit  09/01/23  Reason for Visit: treatment planning  Treatment History: Oncology History Overview Note  Presented with vulvar pruritus   Vulvar cancer (HCC)  04/13/2023 Surgery   EUA, vulvar biopsy, pap test FINDINGS:  Erythema in groin folds and along inner labia majora, atrophic vaginal introitus with atrophic urethral meatus, normal appearing cervix, thickened and friable clitoral hood, 1.5cm ulcerated lesion on upper inner right labia  SPECIMENS: left labial biopsy, right labial biopsy, clitoral hood biopsy, cervical pap smear    04/13/2023 Initial Biopsy   A. LABIA, RIGHT INNER, BIOPSY:  - Invasive moderately differentiated squamous cell carcinoma, see  comment  B. CLITORAL, BIOPSY:  - Invasive moderately differentiated squamous cell carcinoma, see  comment  C. 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  Moderate hypermetabolism in the anal region, SUV max 7.7.  No pelvic adenopathy.  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  Comment that there are swirling vessels in the root of the small bowel mesentery likely due to loose mesenteric attachment    - 05/09/23- Partial modified radical vulvectomy with final pathology showing moderately differentiated squamous cell carcinoma (2x1.6x0.65, depth 6.36mm) with negative margins (2.72mm from deep stromal margin), and VIN3 consistent with at least stage IB squamous cell carcinoma of vulva Invasive and in situ well to moderately differentiated squamous cell carcinoma Tumor measures 2.0 x 1.6 x 0.65 cm and invades to a depth of 6.5 mm (pT1b) Margins free (2.4 mm from deep stromal margin; greater than 10 mm from lateral/side margins) Negative for  angiolymphatic invasion One incidental benign lymph node (0/1)   - 06/27/23 - Vulvar biopsies, laser ablation, bilateral sentinel lymph node mapping and dissection  Well healing vulvectomy bed with small area of granulation tissue superior and anterior to urethra. Intradermal injections of Tc-36m were made surrounding the patient's vulvar lesion bilaterally. Subcutaneous injections of indocyanine green were also made surrounding the patient's vulvar lesion. Successful sentinel lymph node mapping and biopsy of inguinal nodes bilaterally which were noted to be both hot, green and bright. Biopsies taken of granulation tissue and of prior area of vulvar displasia. CO2 laser ablation performed of entire area down to a second surgical layer. Silvadene cream applied. Good hemostasis at end of case.    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 Wiley Hanger. Final pathology returned with: A. RIGHT INGUINAL LYMPH NODE RESECTION:  - Metastatic squamous cell carcinoma involving one of four lymph nodes (0/4)  - Metastatic carcinoma measures approximately 0.5 cm  - No evidence of extranodal extension  - Previous  procedure-related  changes   B. VULVA, RIGHT 9:00, BIOPSY:  - Fragments of stroma with acute and chronic inflammation  - Residual mucosa is not identified  - No evidence of malignancy    She was seen on 08/16/2023 and was started on bactrim  at that time for worsening prev JP site erythema, drainage, edema. CBC and abdominal superficial ultrasound were obtained. WBC count was at 13.6 and ultrasound returned with no substantial surrounding fluid collections in the lower right abdomen/right inguinal region superficially. Wound culture also obtained returning with rare e coli, few staph aureus.   Clinically improving when she was seen in early June.  Interval History: Overall doing well.  Notes some itching in her groin and vulva.  Small amount of drainage from her right groin drain incision, denies any fevers or chills.  Reports baseline bowel bladder function.  Her PCP felt a breast mass during recent visit.  She is set up for an ultrasound later this month.  Past Medical/Surgical History: Past Medical History:  Diagnosis Date   Anal squamous cell carcinoma (HCC)    Anxiety    Arthritis    Depression    History of adenomatous polyp of colon 2015   History of hiatal hernia    Hypothyroidism    IDA (iron deficiency anemia)    hematolody-- dr a. 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   Vulva cancer [facility])     Past Surgical History:  Procedure Laterality Date   BREAST REDUCTION SURGERY  2001   COLONOSCOPY WITH PROPOFOL   06/04/2013   dr Willy Harvest   DILATION AND CURETTAGE OF UTERUS     yrs ago   INGUINAL LYMPHADENECTOMY Right 08/03/2023   Procedure: LYMPHADENECTOMY, INGUINAL, OPEN; VULVA BIOPSY;  Surgeon: Suzi Essex, MD;  Location: [facility];  Service: Gynecology;  Laterality: Right;   LAPAROSCOPIC GASTRIC BANDING WITH HIATAL HERNIA REPAIR   03/28/2006   @ [facility] by dr b. Alray Askew   LAPAROSCOPIC ROUX-EN-Y GASTRIC BYPASS WITH UPPER ENDOSCOPY AND REMOVAL OF LAP BAND  02/26/2019   @ [facility] by dr j. dasher   LESION REMOVAL N/A 12/30/2022   Procedure: EXCISION BIOPSY PERIANAL LESION;  Surgeon: Joyce Nixon, MD;  Location: [facility];  Service: General;  Laterality: N/A;   VULVA Asher Blade BIOPSY N/A 04/13/2023   Procedure: VULVAR BIOPSY WITH PAPSMERE;  Surgeon: Kiki Pelton, MD;  Location: [facility];  Service: Gynecology;  Laterality: N/A;   VULVECTOMY N/A 05/09/2023   Procedure: PARTIAL MODIFIED RADICAL VULVECTOMY;  Surgeon: Suzi Essex, MD;  Location: [facility];  Service: Gynecology;  Laterality: N/A;   WISDOM TOOTH EXTRACTION      Family History  Problem Relation Age of Onset   Heart attack Mother    Breast cancer Mother    Heart attack Father    Breast cancer Maternal Grandmother        colon cancer   Colon cancer Maternal Grandmother    Diabetes Neg Hx    Hyperlipidemia Neg Hx    Hypertension Neg Hx    Sudden death Neg Hx    Prostate cancer Neg Hx    Pancreatic cancer Neg Hx    Ovarian cancer Neg Hx    Endometrial cancer Neg Hx     Social History   Socioeconomic History   Marital status: Single  Spouse name: Not on file   Number of children: Not on file   Years of education: Not on file   Highest education level: Not on file  Occupational History   Not on file  Tobacco Use   Smoking status: Former    Types: Cigarettes   Smokeless tobacco: Never   Tobacco comments:    04-12-2023  quit smoking 2000,  started age 36  Vaping Use   Vaping status: Never Used  Substance and Sexual Activity   Alcohol use: Not Currently    Comment: rare   Drug use: Never   Sexual activity: Not on file  Other Topics Concern   Not on file  Social History Narrative   Not on file   Social Drivers of Health   Financial Resource Strain: Low Risk  (08/29/2023)   [facility]   Overall Financial Resource Strain (CARDIA)    Difficulty of Paying Living Expenses: Not hard at all  Food Insecurity: No Food Insecurity (08/29/2023)   Received from [facility]   Hunger Vital Sign    Within the past 12 months, you worried that your food would run out before you got the money to buy more.: Never true    Within the past 12 months, the food you bought just didn't last and you didn't have money to get more.: Never true  Transportation Needs: No Transportation Needs (08/29/2023)   Received from [facility] - Transportation    Lack of Transportation (Medical): No    Lack of Transportation (Non-Medical): No  Physical Activity: Unknown (08/29/2023)   Received from [facility]   Exercise Vital Sign    On average, how many days per week do you engage in moderate to strenuous exercise (like a brisk walk)?: 0 days    Minutes of Exercise per Session: Not on file  Stress: No Stress Concern Present (08/29/2023)   Received from [facility] - Occupational Stress Questionnaire    Feeling of Stress : Only a little  Social Connections: Moderately Integrated (08/29/2023)   Received from [facility]   Social Network    How would you rate your social network (family, work, friends)?: Adequate participation with social networks    Current Medications:  Current Outpatient Medications:    fluconazole (DIFLUCAN) 150 MG tablet, Take 1 tablet (150 mg total) by mouth daily. Take one dose when you pick up the prescription and second dose 72  hours later, Disp: 2 tablet, Rfl: 1   nystatin (MYCOSTATIN/NYSTOP) powder, Apply 1 Application topically 3 (three) times daily., Disp: 15 g, Rfl: 1   acetaminophen  (TYLENOL ) 500 MG tablet, Take 500 mg by mouth every 6 (six) hours as needed for moderate pain (pain score 4-6)., Disp: , Rfl:    BIOTIN PO, Take 6,000 mcg by mouth daily., Disp: , Rfl:    Calcium  Carb-Cholecalciferol (CALCIUM  500 + D  PO), Take 2 tablets by mouth daily., Disp: , Rfl:    Cholecalciferol 50 MCG (2000 UT) TABS, Take 4,000 Units by mouth daily., Disp: , Rfl:    clobetasol  ointment (TEMOVATE ) 0.05 %, Apply to affected area every night for 4 weeks, then every other day for 4 weeks and then twice a week for 4 weeks or until resolution. (Patient taking differently: Apply 1 Application topically daily.), Disp: 30 g, Rfl: 5   Ferrous Sulfate (IRON PO), Take 26 mg by mouth daily., Disp: , Rfl:    FLUoxetine  (  PROZAC ) 40 MG capsule, Take 40 mg by mouth at bedtime., Disp: , Rfl:    hydrOXYzine  (ATARAX ) 25 MG tablet, TAKE 1 TABLET(25 MG) BY MOUTH AT BEDTIME AS NEEDED FOR ITCHING (Patient taking differently: Take 25 mg by mouth at bedtime as needed for itching.), Disp: 30 tablet, Rfl: 2   levothyroxine  (SYNTHROID ) 137 MCG tablet, Take 137 mcg by mouth daily before breakfast., Disp: , Rfl:    loratadine (CLARITIN) 10 MG tablet, Take 10 mg by mouth daily as needed for allergies., Disp: , Rfl:    mineral oil-hydrophilic petrolatum  (AQUAPHOR) ointment, Apply topically as needed for dry skin., Disp: 420 g, Rfl: 0   Multiple Vitamin (MULTIVITAMIN) capsule, Take 2 capsules by mouth daily., Disp: , Rfl:    sulfamethoxazole -trimethoprim  (BACTRIM  DS) 800-160 MG tablet, Take 1 tablet by mouth 2 (two) times daily., Disp: 20 tablet, Rfl: 0   traMADol  (ULTRAM ) 50 MG tablet, Take 1 tablet (50 mg total) by mouth every 6 (six) hours as needed for severe pain (pain score 7-10). For AFTER surgery only, do not take and drive, Disp: 10 tablet, Rfl: 0  Review of Systems: + itching Denies appetite changes, fevers, chills, fatigue, unexplained weight changes. Denies hearing loss, neck lumps or masses, mouth sores, ringing in ears or voice changes. Denies cough or wheezing.  Denies shortness of breath. Denies chest pain or palpitations. Denies leg swelling. Denies abdominal distention, pain, blood in stools, constipation, diarrhea, nausea, vomiting,  or early satiety. Denies pain with intercourse, dysuria, frequency, hematuria or incontinence. Denies hot flashes, pelvic pain, vaginal bleeding or vaginal discharge.   Denies joint pain, back pain or muscle pain/cramps. Denies rash, or wounds. Denies dizziness, headaches, numbness or seizures. Denies swollen lymph nodes or glands, denies easy bruising or bleeding. Denies anxiety, depression, confusion, or decreased concentration.  Physical Exam: BP 121/65 (BP Location: Left Arm, Patient Position: Sitting)   Pulse 73   Temp 98.5 F (36.9 C) (Oral)   Resp 16   Ht 5' 2 (1.575 m)   Wt 242 lb 12.8 oz (110.1 kg)   SpO2 99%   BMI 44.41 kg/m  General: Alert, oriented, no acute distress. HEENT: Posterior oropharynx clear, sclera anicteric.  Abdomen: Mild edema of the pannus. Extremities: Grossly normal range of motion.  Warm, well perfused.  No edema bilaterally. Skin: Erythema and skin changes in bilateral groins, right more than left as well as inner upper thighs and vulva consistent with candidiasis.  Healed incision within right groin.  Some edema around the incision although no fluctuance.  Laboratory & Radiologic Studies: None new  Assessment & Plan: Leslie [name] is a 68 y.o. 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  Patient is overall doing well postoperatively.  Discussed continued expectations and restrictions.  Given exam findings today consistent with candidiasis, 2 doses of Diflucan sent to her pharmacy as well as nystatin.  I have asked her to call if symptoms do not improve.  Given multiple positive right inguinal lymph nodes, plan is for radiation to the right groin.  She is scheduled to start this on 6/16.  Given vulvar tumor itself, we opted not to give her vulvar radiation and in the setting of her negative left sentinel lymph node, will not be radiating the left groin.  I will  plan to see her for follow-up approximately 3 months after she sees Dr. Eloise Hake for follow-up (typically 1 month after completing treatment).  We  discussed plan for surveillance visits every 3 months subsequently.  18 minutes of total time was spent for this patient encounter, including preparation, face-to-face counseling with the patient and coordination of care, and documentation of the encounter.  Wiley Hanger, MD  Division of Gynecologic Oncology  Department of Obstetrics and Gynecology  [facility]

## 2023-09-01 NOTE — Patient Instructions (Signed)
 It was good to see you today.  I am sending in 2 medications to treat your yeast infection.  1 is a pill that you will take when you pick up the prescription and a second dose to take 72 hours after.  I am also sending in powder that I would like you to use until itching has improved along with redness and the odor.  Please call the clinic or send us  a MyChart message if symptoms do not improve.  Will plan to see you back after you finish radiation and have seen Dr. Eloise Hake for follow-up.

## 2023-09-05 ENCOUNTER — Ambulatory Visit
Admission: RE | Admit: 2023-09-05 | Discharge: 2023-09-05 | Disposition: A | Discharge status: Home / Self Care | Source: Ambulatory Visit | Attending: Radiation Oncology | Admitting: Radiation Oncology

## 2023-09-05 DIAGNOSIS — C519 Malignant neoplasm of vulva, unspecified: Secondary | ICD-10-CM

## 2023-09-05 DIAGNOSIS — C158 Malignant neoplasm of overlapping sites of esophagus: Secondary | ICD-10-CM | POA: Diagnosis present

## 2023-09-08 DIAGNOSIS — C158 Malignant neoplasm of overlapping sites of esophagus: Secondary | ICD-10-CM | POA: Diagnosis not present

## 2023-09-11 ENCOUNTER — Encounter: Payer: Self-pay | Admitting: Gynecologic Oncology

## 2023-09-12 ENCOUNTER — Telehealth: Payer: Self-pay | Admitting: *Deleted

## 2023-09-12 NOTE — Telephone Encounter (Signed)
 Spoke with patient and relayed message from Dr. Viktoria that she would like patient to be seen tomorrow in the clinic with Dr. Rogelio. Pt agreed to appt. And scheduled for 6/24 at 1030. Pt thanked the office for calling back.

## 2023-09-12 NOTE — Telephone Encounter (Signed)
 Spoke with patient who sent a MyChart message this morning with a picture. Pt states she noticed a clear red drainage to her  abdominal wound that was previously healed and closed. A small distal portion of the incision appears to have opened up with some redness without edema present.   Pt denies fever, chills, and or pain. Pt reports keeping the area clean and has placed a dry sterile bandage over the area. Advised patient her message will be forwarded to provider. And the office would call back with recommendations. Pt thanked the office.

## 2023-09-13 ENCOUNTER — Inpatient Hospital Stay (HOSPITAL_BASED_OUTPATIENT_CLINIC_OR_DEPARTMENT_OTHER): Admitting: Obstetrics & Gynecology

## 2023-09-13 ENCOUNTER — Encounter: Payer: Self-pay | Admitting: Obstetrics & Gynecology

## 2023-09-13 ENCOUNTER — Telehealth: Payer: Self-pay

## 2023-09-13 ENCOUNTER — Inpatient Hospital Stay

## 2023-09-13 VITALS — BP 121/56 | HR 62 | Temp 97.8°F | Resp 16 | Wt 242.0 lb

## 2023-09-13 DIAGNOSIS — Z8544 Personal history of malignant neoplasm of other female genital organs: Secondary | ICD-10-CM | POA: Diagnosis present

## 2023-09-13 DIAGNOSIS — T8149XA Infection following a procedure, other surgical site, initial encounter: Secondary | ICD-10-CM

## 2023-09-13 DIAGNOSIS — B372 Candidiasis of skin and nail: Secondary | ICD-10-CM | POA: Diagnosis not present

## 2023-09-13 DIAGNOSIS — C774 Secondary and unspecified malignant neoplasm of inguinal and lower limb lymph nodes: Secondary | ICD-10-CM | POA: Diagnosis present

## 2023-09-13 DIAGNOSIS — Z9079 Acquired absence of other genital organ(s): Secondary | ICD-10-CM | POA: Diagnosis not present

## 2023-09-13 MED ORDER — CEPHALEXIN 500 MG PO CAPS: 500.0000 mg | ORAL_CAPSULE | Freq: Four times a day (QID) | ORAL | 0 refills | Status: AC

## 2023-09-13 NOTE — Addendum Note (Signed)
 Addended by: ROGELIO PLANAS on: 09/13/2023 02:23 PM   Modules accepted: Orders

## 2023-09-13 NOTE — Progress Notes (Signed)
 Follow Up Note: Gyn-Onc  Leslie [name] 68 y.o. female  CC: Wound drainage   HPI: She is s/p right inguinal lymph node dissection, right vulvar biopsy on 08/03/23 with Dr. Comer Dollar.  Discussed the use of AI scribe software for clinical note transcription with the patient, who gave verbal consent to proceed.  History of Present Illness  One of their groin incisions has reopened and is seeping. The seepage began approximately three days ago, after a period without any drainage. The fluid is described as clear with a slight reddish tint. They have restarted bandaging the area due to the seepage, although it is not bothersome.  Their daughter examined the site and noted that the seepage is not from the previous drainage site but from the wound itself. A picture was taken and sent in for further evaluation. No fever or pain is associated with the wound.  They are concerned about the potential impact of this issue on their scheduled radiation treatment, which is set to begin tomorrow.    Review of Systems  Review of Systems  Constitutional:  Negative for malaise/fatigue and weight loss.  Respiratory:  Negative for shortness of breath and wheezing.   Cardiovascular:  Negative for chest pain and leg swelling.  Gastrointestinal:  Negative for abdominal pain, blood in stool, constipation, nausea and vomiting.  Genitourinary:  Negative for dysuria, frequency, hematuria and urgency.  Musculoskeletal:  Negative for joint pain and myalgias.  Neurological:  Negative for weakness.  Psychiatric/Behavioral:  Negative for depression. The patient does not have insomnia.    Current medications, allergy, social history, past surgical history, past medical history, family history were all reviewed.    Vitals:  Blood pressure (!) 121/56, pulse 62, temperature 97.8 F (36.6 C), temperature source Oral, resp. rate 16, weight 242 lb (109.8 kg), SpO2 100%.  Physical Exam:  Physical Exam Exam  conducted with a chaperone present.  Constitutional:      General: She is not in acute distress. Abdominal:    Right groin skin separation at right margin w/surrounding erythema/induration; defect probed--1 x 1 x 0.5 cm; no purulent drainage; culture sent; wound irrigated, packed w/packing strip, dressing applied    Assessment & Plan Postoperative wound cellulitis  1. Postoperative wound cellulitis Superficial separation/seroma; surrounding mild cellulitis - cephALEXin (KEFLEX) 500 MG capsule; Take 1 capsule (500 mg total) by mouth 4 (four) times daily for 5 days. - WOUND CULTURE - Wound care reviewed - Return prn or in 1 wk  I personally spent 25 minutes face-to-face and non-face-to-face in the care of this patient, which includes all pre, intra, and post visit time on the date of service.       Olam Mill, MD

## 2023-09-13 NOTE — Telephone Encounter (Signed)
 Called patient to tell her per Dr. Shannon that he is going to delay her radiation treatment for one week and reevaluate then.

## 2023-09-13 NOTE — Assessment & Plan Note (Signed)
  1. Postoperative wound cellulitis Superficial separation/seroma; surrounding mild cellulitis - cephALEXin (KEFLEX) 500 MG capsule; Take 1 capsule (500 mg total) by mouth 4 (four) times daily for 5 days. - WOUND CULTURE - Wound care reviewed - Return prn or in 1 wk  I personally spent 25 minutes face-to-face and non-face-to-face in the care of this patient, which includes all pre, intra, and post visit time on the date of service.

## 2023-09-13 NOTE — Telephone Encounter (Signed)
 Ami RN called regarding a MyChart message with a picture from Leslie [name]. She is a new start for radiation treatment on tomorrow 09/13/23 with Dr. Shannon. She was concerned about an area that has opened up with some redness and drainage to her abdominal wound hat was previously healed and closed.

## 2023-09-14 ENCOUNTER — Ambulatory Visit: Admitting: Radiation Oncology

## 2023-09-15 ENCOUNTER — Ambulatory Visit

## 2023-09-15 LAB — AEROBIC CULTURE W GRAM STAIN (SUPERFICIAL SPECIMEN): Gram Stain: NONE SEEN

## 2023-09-16 ENCOUNTER — Ambulatory Visit

## 2023-09-19 ENCOUNTER — Ambulatory Visit

## 2023-09-20 ENCOUNTER — Ambulatory Visit

## 2023-09-20 ENCOUNTER — Inpatient Hospital Stay: Attending: Gynecologic Oncology | Admitting: Gynecologic Oncology

## 2023-09-20 VITALS — BP 130/52 | HR 62 | Temp 98.4°F | Resp 16 | Ht 62.0 in | Wt 245.4 lb

## 2023-09-20 DIAGNOSIS — T148XXA Other injury of unspecified body region, initial encounter: Secondary | ICD-10-CM

## 2023-09-20 NOTE — Progress Notes (Unsigned)
 Patient seen today for a nurse visit for wound check. Pt vital signs stable, pt without pain or discomfort. Dressing removed, wound clean, dry and intact with slight clear to yellow drainage on iodoform packing strip. Area cleaned with normal saline. Lower abdominal incision healed with small area to the right side that is open approx. 2 cm in length and approx. Less than 1 cm deep. Wound repacked with small strip (3-4 cm long) of iodoform packing strip covered with dry sterile 2x2  and 4x4 dry sterile gauze folded in half and secured with paper tape. Pt tolerated procedure well. Pt states she continues to change the dressing once daily. Advised to call the office with any change in symptoms, increased drainage, with or without odor, bleeding, pain, fever and or chills. Leslie [name] also reminded of her appointment tomorrow with Dr. Shannon. Pt verbalized understanding and thanked the office.

## 2023-09-21 ENCOUNTER — Other Ambulatory Visit: Payer: Self-pay

## 2023-09-21 ENCOUNTER — Ambulatory Visit
Admission: RE | Admit: 2023-09-21 | Discharge: 2023-09-21 | Disposition: A | Discharge status: Home / Self Care | Source: Ambulatory Visit | Attending: Radiation Oncology | Admitting: Radiation Oncology

## 2023-09-21 DIAGNOSIS — Z510 Encounter for antineoplastic radiation therapy: Secondary | ICD-10-CM | POA: Diagnosis present

## 2023-09-21 DIAGNOSIS — C774 Secondary and unspecified malignant neoplasm of inguinal and lower limb lymph nodes: Secondary | ICD-10-CM | POA: Diagnosis not present

## 2023-09-21 DIAGNOSIS — C518 Malignant neoplasm of overlapping sites of vulva: Secondary | ICD-10-CM | POA: Insufficient documentation

## 2023-09-21 DIAGNOSIS — C519 Malignant neoplasm of vulva, unspecified: Secondary | ICD-10-CM | POA: Insufficient documentation

## 2023-09-21 LAB — RAD ONC ARIA SESSION SUMMARY
Course Elapsed Days: 0
Plan Fractions Treated to Date: 1
Plan Prescribed Dose Per Fraction: 1.8 Gy
Plan Total Fractions Prescribed: 28
Plan Total Prescribed Dose: 50.4 Gy
Reference Point Dosage Given to Date: 1.8 Gy
Reference Point Session Dosage Given: 1.8 Gy
Session Number: 1

## 2023-09-22 ENCOUNTER — Other Ambulatory Visit: Payer: Self-pay

## 2023-09-22 ENCOUNTER — Ambulatory Visit
Admission: RE | Admit: 2023-09-22 | Discharge: 2023-09-22 | Disposition: A | Discharge status: Home / Self Care | Source: Ambulatory Visit | Attending: Radiation Oncology | Admitting: Radiation Oncology

## 2023-09-22 DIAGNOSIS — Z510 Encounter for antineoplastic radiation therapy: Secondary | ICD-10-CM | POA: Diagnosis not present

## 2023-09-22 LAB — RAD ONC ARIA SESSION SUMMARY
Course Elapsed Days: 1
Plan Fractions Treated to Date: 2
Plan Prescribed Dose Per Fraction: 1.8 Gy
Plan Total Fractions Prescribed: 28
Plan Total Prescribed Dose: 50.4 Gy
Reference Point Dosage Given to Date: 3.6 Gy
Reference Point Session Dosage Given: 1.8 Gy
Session Number: 2

## 2023-09-26 ENCOUNTER — Ambulatory Visit
Admission: RE | Admit: 2023-09-26 | Discharge: 2023-09-26 | Disposition: A | Discharge status: Home / Self Care | Source: Ambulatory Visit | Attending: Radiation Oncology | Admitting: Radiation Oncology

## 2023-09-26 ENCOUNTER — Other Ambulatory Visit: Payer: Self-pay

## 2023-09-26 DIAGNOSIS — Z510 Encounter for antineoplastic radiation therapy: Secondary | ICD-10-CM | POA: Diagnosis not present

## 2023-09-26 LAB — RAD ONC ARIA SESSION SUMMARY
Course Elapsed Days: 5
Plan Fractions Treated to Date: 3
Plan Prescribed Dose Per Fraction: 1.8 Gy
Plan Total Fractions Prescribed: 28
Plan Total Prescribed Dose: 50.4 Gy
Reference Point Dosage Given to Date: 5.4 Gy
Reference Point Session Dosage Given: 1.8 Gy
Session Number: 3

## 2023-09-27 ENCOUNTER — Ambulatory Visit
Admission: RE | Admit: 2023-09-27 | Discharge: 2023-09-27 | Disposition: A | Discharge status: Home / Self Care | Source: Ambulatory Visit | Attending: Radiation Oncology | Admitting: Radiation Oncology

## 2023-09-27 ENCOUNTER — Ambulatory Visit

## 2023-09-27 ENCOUNTER — Other Ambulatory Visit: Payer: Self-pay

## 2023-09-27 DIAGNOSIS — Z510 Encounter for antineoplastic radiation therapy: Secondary | ICD-10-CM | POA: Diagnosis not present

## 2023-09-27 LAB — RAD ONC ARIA SESSION SUMMARY
Course Elapsed Days: 6
Plan Fractions Treated to Date: 4
Plan Prescribed Dose Per Fraction: 1.8 Gy
Plan Total Fractions Prescribed: 28
Plan Total Prescribed Dose: 50.4 Gy
Reference Point Dosage Given to Date: 7.2 Gy
Reference Point Session Dosage Given: 1.8 Gy
Session Number: 4

## 2023-09-28 ENCOUNTER — Ambulatory Visit
Admission: RE | Admit: 2023-09-28 | Discharge: 2023-09-28 | Disposition: A | Discharge status: Home / Self Care | Source: Ambulatory Visit | Attending: Radiation Oncology | Admitting: Radiation Oncology

## 2023-09-28 ENCOUNTER — Other Ambulatory Visit: Payer: Self-pay

## 2023-09-28 DIAGNOSIS — Z510 Encounter for antineoplastic radiation therapy: Secondary | ICD-10-CM | POA: Diagnosis not present

## 2023-09-28 LAB — RAD ONC ARIA SESSION SUMMARY
Course Elapsed Days: 7
Plan Fractions Treated to Date: 5
Plan Prescribed Dose Per Fraction: 1.8 Gy
Plan Total Fractions Prescribed: 28
Plan Total Prescribed Dose: 50.4 Gy
Reference Point Dosage Given to Date: 9 Gy
Reference Point Session Dosage Given: 1.8 Gy
Session Number: 5

## 2023-09-29 ENCOUNTER — Other Ambulatory Visit: Payer: Self-pay

## 2023-09-29 ENCOUNTER — Ambulatory Visit
Admission: RE | Admit: 2023-09-29 | Discharge: 2023-09-29 | Disposition: A | Discharge status: Home / Self Care | Source: Ambulatory Visit | Attending: Radiation Oncology | Admitting: Radiation Oncology

## 2023-09-29 DIAGNOSIS — Z510 Encounter for antineoplastic radiation therapy: Secondary | ICD-10-CM | POA: Diagnosis not present

## 2023-09-29 LAB — RAD ONC ARIA SESSION SUMMARY
Course Elapsed Days: 8
Plan Fractions Treated to Date: 6
Plan Prescribed Dose Per Fraction: 1.8 Gy
Plan Total Fractions Prescribed: 28
Plan Total Prescribed Dose: 50.4 Gy
Reference Point Dosage Given to Date: 10.8 Gy
Reference Point Session Dosage Given: 1.8 Gy
Session Number: 6

## 2023-09-30 ENCOUNTER — Ambulatory Visit
Admission: RE | Admit: 2023-09-30 | Discharge: 2023-09-30 | Disposition: A | Discharge status: Home / Self Care | Source: Ambulatory Visit | Attending: Radiation Oncology | Admitting: Radiation Oncology

## 2023-09-30 ENCOUNTER — Other Ambulatory Visit: Payer: Self-pay

## 2023-09-30 DIAGNOSIS — Z510 Encounter for antineoplastic radiation therapy: Secondary | ICD-10-CM | POA: Diagnosis not present

## 2023-09-30 LAB — RAD ONC ARIA SESSION SUMMARY
Course Elapsed Days: 9
Plan Fractions Treated to Date: 7
Plan Prescribed Dose Per Fraction: 1.8 Gy
Plan Total Fractions Prescribed: 28
Plan Total Prescribed Dose: 50.4 Gy
Reference Point Dosage Given to Date: 12.6 Gy
Reference Point Session Dosage Given: 1.8 Gy
Session Number: 7

## 2023-10-03 ENCOUNTER — Other Ambulatory Visit: Payer: Self-pay

## 2023-10-03 ENCOUNTER — Ambulatory Visit
Admission: RE | Admit: 2023-10-03 | Discharge: 2023-10-03 | Disposition: A | Discharge status: Home / Self Care | Source: Ambulatory Visit | Attending: Radiation Oncology | Admitting: Radiation Oncology

## 2023-10-03 DIAGNOSIS — Z510 Encounter for antineoplastic radiation therapy: Secondary | ICD-10-CM | POA: Diagnosis not present

## 2023-10-03 LAB — RAD ONC ARIA SESSION SUMMARY
Course Elapsed Days: 12
Plan Fractions Treated to Date: 8
Plan Prescribed Dose Per Fraction: 1.8 Gy
Plan Total Fractions Prescribed: 28
Plan Total Prescribed Dose: 50.4 Gy
Reference Point Dosage Given to Date: 14.4 Gy
Reference Point Session Dosage Given: 1.8 Gy
Session Number: 8

## 2023-10-04 ENCOUNTER — Other Ambulatory Visit: Payer: Self-pay

## 2023-10-04 ENCOUNTER — Ambulatory Visit
Admission: RE | Admit: 2023-10-04 | Discharge: 2023-10-04 | Disposition: A | Discharge status: Home / Self Care | Source: Ambulatory Visit | Attending: Radiation Oncology | Admitting: Radiation Oncology

## 2023-10-04 DIAGNOSIS — Z510 Encounter for antineoplastic radiation therapy: Secondary | ICD-10-CM | POA: Diagnosis not present

## 2023-10-04 LAB — RAD ONC ARIA SESSION SUMMARY
Course Elapsed Days: 13
Plan Fractions Treated to Date: 9
Plan Prescribed Dose Per Fraction: 1.8 Gy
Plan Total Fractions Prescribed: 28
Plan Total Prescribed Dose: 50.4 Gy
Reference Point Dosage Given to Date: 16.2 Gy
Reference Point Session Dosage Given: 1.8 Gy
Session Number: 9

## 2023-10-05 ENCOUNTER — Ambulatory Visit
Admission: RE | Admit: 2023-10-05 | Discharge: 2023-10-05 | Disposition: A | Discharge status: Home / Self Care | Source: Ambulatory Visit | Attending: Radiation Oncology | Admitting: Radiation Oncology

## 2023-10-05 ENCOUNTER — Other Ambulatory Visit: Payer: Self-pay

## 2023-10-05 DIAGNOSIS — Z510 Encounter for antineoplastic radiation therapy: Secondary | ICD-10-CM | POA: Diagnosis not present

## 2023-10-05 LAB — RAD ONC ARIA SESSION SUMMARY
Course Elapsed Days: 14
Plan Fractions Treated to Date: 10
Plan Prescribed Dose Per Fraction: 1.8 Gy
Plan Total Fractions Prescribed: 28
Plan Total Prescribed Dose: 50.4 Gy
Reference Point Dosage Given to Date: 18 Gy
Reference Point Session Dosage Given: 1.8 Gy
Session Number: 10

## 2023-10-06 ENCOUNTER — Ambulatory Visit
Admission: RE | Admit: 2023-10-06 | Discharge: 2023-10-06 | Disposition: A | Discharge status: Home / Self Care | Source: Ambulatory Visit | Attending: Radiation Oncology | Admitting: Radiation Oncology

## 2023-10-06 ENCOUNTER — Other Ambulatory Visit: Payer: Self-pay

## 2023-10-06 DIAGNOSIS — Z510 Encounter for antineoplastic radiation therapy: Secondary | ICD-10-CM | POA: Diagnosis not present

## 2023-10-06 LAB — RAD ONC ARIA SESSION SUMMARY
Course Elapsed Days: 15
Plan Fractions Treated to Date: 11
Plan Prescribed Dose Per Fraction: 1.8 Gy
Plan Total Fractions Prescribed: 28
Plan Total Prescribed Dose: 50.4 Gy
Reference Point Dosage Given to Date: 19.8 Gy
Reference Point Session Dosage Given: 1.8 Gy
Session Number: 11

## 2023-10-07 ENCOUNTER — Other Ambulatory Visit: Payer: Self-pay

## 2023-10-07 ENCOUNTER — Ambulatory Visit
Admission: RE | Admit: 2023-10-07 | Discharge: 2023-10-07 | Disposition: A | Discharge status: Home / Self Care | Source: Ambulatory Visit | Attending: Radiation Oncology | Admitting: Radiation Oncology

## 2023-10-07 DIAGNOSIS — Z510 Encounter for antineoplastic radiation therapy: Secondary | ICD-10-CM | POA: Diagnosis not present

## 2023-10-07 LAB — RAD ONC ARIA SESSION SUMMARY
Course Elapsed Days: 16
Plan Fractions Treated to Date: 12
Plan Prescribed Dose Per Fraction: 1.8 Gy
Plan Total Fractions Prescribed: 28
Plan Total Prescribed Dose: 50.4 Gy
Reference Point Dosage Given to Date: 21.6 Gy
Reference Point Session Dosage Given: 1.8 Gy
Session Number: 12

## 2023-10-10 ENCOUNTER — Ambulatory Visit
Admission: RE | Admit: 2023-10-10 | Discharge: 2023-10-10 | Disposition: A | Discharge status: Home / Self Care | Source: Ambulatory Visit | Attending: Radiation Oncology | Admitting: Radiation Oncology

## 2023-10-10 ENCOUNTER — Other Ambulatory Visit: Payer: Self-pay

## 2023-10-10 DIAGNOSIS — Z510 Encounter for antineoplastic radiation therapy: Secondary | ICD-10-CM | POA: Diagnosis not present

## 2023-10-10 LAB — RAD ONC ARIA SESSION SUMMARY
Course Elapsed Days: 19
Plan Fractions Treated to Date: 13
Plan Prescribed Dose Per Fraction: 1.8 Gy
Plan Total Fractions Prescribed: 28
Plan Total Prescribed Dose: 50.4 Gy
Reference Point Dosage Given to Date: 23.4 Gy
Reference Point Session Dosage Given: 1.8 Gy
Session Number: 13

## 2023-10-11 ENCOUNTER — Other Ambulatory Visit: Payer: Self-pay

## 2023-10-11 ENCOUNTER — Ambulatory Visit
Admission: RE | Admit: 2023-10-11 | Discharge: 2023-10-11 | Discharge status: Home / Self Care | Source: Ambulatory Visit | Attending: Radiation Oncology | Admitting: Radiation Oncology

## 2023-10-11 ENCOUNTER — Ambulatory Visit
Admission: RE | Admit: 2023-10-11 | Discharge: 2023-10-11 | Disposition: A | Discharge status: Home / Self Care | Source: Ambulatory Visit | Attending: Radiation Oncology | Admitting: Radiation Oncology

## 2023-10-11 DIAGNOSIS — Z510 Encounter for antineoplastic radiation therapy: Secondary | ICD-10-CM | POA: Diagnosis not present

## 2023-10-11 LAB — RAD ONC ARIA SESSION SUMMARY
Course Elapsed Days: 20
Plan Fractions Treated to Date: 14
Plan Prescribed Dose Per Fraction: 1.8 Gy
Plan Total Fractions Prescribed: 28
Plan Total Prescribed Dose: 50.4 Gy
Reference Point Dosage Given to Date: 25.2 Gy
Reference Point Session Dosage Given: 1.8 Gy
Session Number: 14

## 2023-10-12 ENCOUNTER — Ambulatory Visit

## 2023-10-13 ENCOUNTER — Other Ambulatory Visit: Payer: Self-pay

## 2023-10-13 ENCOUNTER — Ambulatory Visit
Admission: RE | Admit: 2023-10-13 | Discharge: 2023-10-13 | Disposition: A | Discharge status: Home / Self Care | Source: Ambulatory Visit | Attending: Radiation Oncology | Admitting: Radiation Oncology

## 2023-10-13 DIAGNOSIS — Z510 Encounter for antineoplastic radiation therapy: Secondary | ICD-10-CM | POA: Diagnosis not present

## 2023-10-13 LAB — RAD ONC ARIA SESSION SUMMARY
Course Elapsed Days: 22
Plan Fractions Treated to Date: 15
Plan Prescribed Dose Per Fraction: 1.8 Gy
Plan Total Fractions Prescribed: 28
Plan Total Prescribed Dose: 50.4 Gy
Reference Point Dosage Given to Date: 27 Gy
Reference Point Session Dosage Given: 1.8 Gy
Session Number: 15

## 2023-10-14 ENCOUNTER — Other Ambulatory Visit: Payer: Self-pay

## 2023-10-14 ENCOUNTER — Ambulatory Visit
Admission: RE | Admit: 2023-10-14 | Discharge: 2023-10-14 | Disposition: A | Discharge status: Home / Self Care | Source: Ambulatory Visit | Attending: Radiation Oncology | Admitting: Radiation Oncology

## 2023-10-14 DIAGNOSIS — Z510 Encounter for antineoplastic radiation therapy: Secondary | ICD-10-CM | POA: Diagnosis not present

## 2023-10-14 LAB — RAD ONC ARIA SESSION SUMMARY
Course Elapsed Days: 23
Plan Fractions Treated to Date: 16
Plan Prescribed Dose Per Fraction: 1.8 Gy
Plan Total Fractions Prescribed: 28
Plan Total Prescribed Dose: 50.4 Gy
Reference Point Dosage Given to Date: 28.8 Gy
Reference Point Session Dosage Given: 1.8 Gy
Session Number: 16

## 2023-10-17 ENCOUNTER — Ambulatory Visit
Admission: RE | Admit: 2023-10-17 | Discharge: 2023-10-17 | Disposition: A | Discharge status: Home / Self Care | Source: Ambulatory Visit | Attending: Radiation Oncology | Admitting: Radiation Oncology

## 2023-10-17 ENCOUNTER — Other Ambulatory Visit: Payer: Self-pay

## 2023-10-17 DIAGNOSIS — Z510 Encounter for antineoplastic radiation therapy: Secondary | ICD-10-CM | POA: Diagnosis not present

## 2023-10-17 LAB — RAD ONC ARIA SESSION SUMMARY
Course Elapsed Days: 26
Plan Fractions Treated to Date: 17
Plan Prescribed Dose Per Fraction: 1.8 Gy
Plan Total Fractions Prescribed: 28
Plan Total Prescribed Dose: 50.4 Gy
Reference Point Dosage Given to Date: 30.6 Gy
Reference Point Session Dosage Given: 1.8 Gy
Session Number: 17

## 2023-10-18 ENCOUNTER — Other Ambulatory Visit: Payer: Self-pay

## 2023-10-18 ENCOUNTER — Ambulatory Visit
Admission: RE | Admit: 2023-10-18 | Discharge: 2023-10-18 | Disposition: A | Discharge status: Home / Self Care | Source: Ambulatory Visit | Attending: Radiation Oncology | Admitting: Radiation Oncology

## 2023-10-18 DIAGNOSIS — Z510 Encounter for antineoplastic radiation therapy: Secondary | ICD-10-CM | POA: Diagnosis not present

## 2023-10-18 LAB — RAD ONC ARIA SESSION SUMMARY
Course Elapsed Days: 27
Plan Fractions Treated to Date: 18
Plan Prescribed Dose Per Fraction: 1.8 Gy
Plan Total Fractions Prescribed: 28
Plan Total Prescribed Dose: 50.4 Gy
Reference Point Dosage Given to Date: 32.4 Gy
Reference Point Session Dosage Given: 1.8 Gy
Session Number: 18

## 2023-10-19 ENCOUNTER — Other Ambulatory Visit: Payer: Self-pay

## 2023-10-19 ENCOUNTER — Ambulatory Visit
Admission: RE | Admit: 2023-10-19 | Discharge: 2023-10-19 | Disposition: A | Discharge status: Home / Self Care | Source: Ambulatory Visit | Attending: Radiation Oncology | Admitting: Radiation Oncology

## 2023-10-19 DIAGNOSIS — Z510 Encounter for antineoplastic radiation therapy: Secondary | ICD-10-CM | POA: Diagnosis not present

## 2023-10-19 LAB — RAD ONC ARIA SESSION SUMMARY
Course Elapsed Days: 28
Plan Fractions Treated to Date: 19
Plan Prescribed Dose Per Fraction: 1.8 Gy
Plan Total Fractions Prescribed: 28
Plan Total Prescribed Dose: 50.4 Gy
Reference Point Dosage Given to Date: 34.2 Gy
Reference Point Session Dosage Given: 1.8 Gy
Session Number: 19

## 2023-10-20 ENCOUNTER — Ambulatory Visit
Admission: RE | Admit: 2023-10-20 | Discharge: 2023-10-20 | Disposition: A | Discharge status: Home / Self Care | Source: Ambulatory Visit | Attending: Radiation Oncology | Admitting: Radiation Oncology

## 2023-10-20 ENCOUNTER — Other Ambulatory Visit: Payer: Self-pay

## 2023-10-20 DIAGNOSIS — Z510 Encounter for antineoplastic radiation therapy: Secondary | ICD-10-CM | POA: Diagnosis not present

## 2023-10-20 LAB — RAD ONC ARIA SESSION SUMMARY
Course Elapsed Days: 29
Plan Fractions Treated to Date: 20
Plan Prescribed Dose Per Fraction: 1.8 Gy
Plan Total Fractions Prescribed: 28
Plan Total Prescribed Dose: 50.4 Gy
Reference Point Dosage Given to Date: 36 Gy
Reference Point Session Dosage Given: 1.8 Gy
Session Number: 20

## 2023-10-21 ENCOUNTER — Other Ambulatory Visit: Payer: Self-pay

## 2023-10-21 ENCOUNTER — Ambulatory Visit
Admission: RE | Admit: 2023-10-21 | Discharge: 2023-10-21 | Disposition: A | Discharge status: Home / Self Care | Source: Ambulatory Visit | Attending: Radiation Oncology | Admitting: Radiation Oncology

## 2023-10-21 DIAGNOSIS — C774 Secondary and unspecified malignant neoplasm of inguinal and lower limb lymph nodes: Secondary | ICD-10-CM | POA: Diagnosis present

## 2023-10-21 DIAGNOSIS — Z510 Encounter for antineoplastic radiation therapy: Secondary | ICD-10-CM | POA: Insufficient documentation

## 2023-10-21 DIAGNOSIS — C518 Malignant neoplasm of overlapping sites of vulva: Secondary | ICD-10-CM | POA: Diagnosis present

## 2023-10-21 LAB — RAD ONC ARIA SESSION SUMMARY
Course Elapsed Days: 30
Plan Fractions Treated to Date: 21
Plan Prescribed Dose Per Fraction: 1.8 Gy
Plan Total Fractions Prescribed: 28
Plan Total Prescribed Dose: 50.4 Gy
Reference Point Dosage Given to Date: 37.8 Gy
Reference Point Session Dosage Given: 1.8 Gy
Session Number: 21

## 2023-10-24 ENCOUNTER — Ambulatory Visit

## 2023-10-24 ENCOUNTER — Ambulatory Visit
Admission: RE | Admit: 2023-10-24 | Discharge: 2023-10-24 | Disposition: A | Discharge status: Home / Self Care | Source: Ambulatory Visit | Attending: Radiation Oncology | Admitting: Radiation Oncology

## 2023-10-24 ENCOUNTER — Other Ambulatory Visit: Payer: Self-pay

## 2023-10-24 DIAGNOSIS — Z510 Encounter for antineoplastic radiation therapy: Secondary | ICD-10-CM | POA: Diagnosis not present

## 2023-10-24 LAB — RAD ONC ARIA SESSION SUMMARY
Course Elapsed Days: 33
Plan Fractions Treated to Date: 22
Plan Prescribed Dose Per Fraction: 1.8 Gy
Plan Total Fractions Prescribed: 28
Plan Total Prescribed Dose: 50.4 Gy
Reference Point Dosage Given to Date: 39.6 Gy
Reference Point Session Dosage Given: 1.8 Gy
Session Number: 22

## 2023-10-25 ENCOUNTER — Ambulatory Visit
Admission: RE | Admit: 2023-10-25 | Discharge: 2023-10-25 | Disposition: A | Discharge status: Home / Self Care | Source: Ambulatory Visit | Attending: Radiation Oncology | Admitting: Radiation Oncology

## 2023-10-25 ENCOUNTER — Other Ambulatory Visit: Payer: Self-pay

## 2023-10-25 DIAGNOSIS — Z510 Encounter for antineoplastic radiation therapy: Secondary | ICD-10-CM | POA: Diagnosis not present

## 2023-10-25 LAB — RAD ONC ARIA SESSION SUMMARY
Course Elapsed Days: 34
Plan Fractions Treated to Date: 23
Plan Prescribed Dose Per Fraction: 1.8 Gy
Plan Total Fractions Prescribed: 28
Plan Total Prescribed Dose: 50.4 Gy
Reference Point Dosage Given to Date: 41.4 Gy
Reference Point Session Dosage Given: 1.8 Gy
Session Number: 23

## 2023-10-26 ENCOUNTER — Other Ambulatory Visit: Payer: Self-pay

## 2023-10-26 ENCOUNTER — Ambulatory Visit
Admission: RE | Admit: 2023-10-26 | Discharge: 2023-10-26 | Disposition: A | Discharge status: Home / Self Care | Source: Ambulatory Visit | Attending: Radiation Oncology | Admitting: Radiation Oncology

## 2023-10-26 DIAGNOSIS — Z510 Encounter for antineoplastic radiation therapy: Secondary | ICD-10-CM | POA: Diagnosis not present

## 2023-10-26 LAB — RAD ONC ARIA SESSION SUMMARY
Course Elapsed Days: 35
Plan Fractions Treated to Date: 24
Plan Prescribed Dose Per Fraction: 1.8 Gy
Plan Total Fractions Prescribed: 28
Plan Total Prescribed Dose: 50.4 Gy
Reference Point Dosage Given to Date: 43.2 Gy
Reference Point Session Dosage Given: 1.8 Gy
Session Number: 24

## 2023-10-27 ENCOUNTER — Ambulatory Visit
Admission: RE | Admit: 2023-10-27 | Discharge: 2023-10-27 | Discharge status: Home / Self Care | Source: Ambulatory Visit | Attending: Radiation Oncology | Admitting: Radiation Oncology

## 2023-10-27 ENCOUNTER — Other Ambulatory Visit: Payer: Self-pay

## 2023-10-27 DIAGNOSIS — Z510 Encounter for antineoplastic radiation therapy: Secondary | ICD-10-CM | POA: Diagnosis not present

## 2023-10-27 LAB — RAD ONC ARIA SESSION SUMMARY
Course Elapsed Days: 36
Plan Fractions Treated to Date: 25
Plan Prescribed Dose Per Fraction: 1.8 Gy
Plan Total Fractions Prescribed: 28
Plan Total Prescribed Dose: 50.4 Gy
Reference Point Dosage Given to Date: 45 Gy
Reference Point Session Dosage Given: 1.8 Gy
Session Number: 25

## 2023-10-28 ENCOUNTER — Other Ambulatory Visit: Payer: Self-pay

## 2023-10-28 ENCOUNTER — Ambulatory Visit
Admission: RE | Admit: 2023-10-28 | Discharge: 2023-10-28 | Disposition: A | Discharge status: Home / Self Care | Source: Ambulatory Visit | Attending: Radiation Oncology | Admitting: Radiation Oncology

## 2023-10-28 DIAGNOSIS — Z510 Encounter for antineoplastic radiation therapy: Secondary | ICD-10-CM | POA: Diagnosis not present

## 2023-10-28 LAB — RAD ONC ARIA SESSION SUMMARY
Course Elapsed Days: 37
Plan Fractions Treated to Date: 26
Plan Prescribed Dose Per Fraction: 1.8 Gy
Plan Total Fractions Prescribed: 28
Plan Total Prescribed Dose: 50.4 Gy
Reference Point Dosage Given to Date: 46.8 Gy
Reference Point Session Dosage Given: 1.8 Gy
Session Number: 26

## 2023-10-31 ENCOUNTER — Ambulatory Visit

## 2023-10-31 ENCOUNTER — Ambulatory Visit
Admission: RE | Admit: 2023-10-31 | Discharge: 2023-10-31 | Disposition: A | Discharge status: Home / Self Care | Source: Ambulatory Visit | Attending: Radiation Oncology | Admitting: Radiation Oncology

## 2023-10-31 ENCOUNTER — Other Ambulatory Visit: Payer: Self-pay

## 2023-10-31 DIAGNOSIS — Z510 Encounter for antineoplastic radiation therapy: Secondary | ICD-10-CM | POA: Diagnosis not present

## 2023-10-31 LAB — RAD ONC ARIA SESSION SUMMARY
Course Elapsed Days: 40
Plan Fractions Treated to Date: 27
Plan Prescribed Dose Per Fraction: 1.8 Gy
Plan Total Fractions Prescribed: 28
Plan Total Prescribed Dose: 50.4 Gy
Reference Point Dosage Given to Date: 48.6 Gy
Reference Point Session Dosage Given: 1.8 Gy
Session Number: 27

## 2023-11-01 ENCOUNTER — Ambulatory Visit
Admission: RE | Admit: 2023-11-01 | Discharge: 2023-11-01 | Disposition: A | Discharge status: Home / Self Care | Source: Ambulatory Visit | Attending: Radiation Oncology | Admitting: Radiation Oncology

## 2023-11-01 ENCOUNTER — Other Ambulatory Visit: Payer: Self-pay

## 2023-11-01 DIAGNOSIS — Z510 Encounter for antineoplastic radiation therapy: Secondary | ICD-10-CM | POA: Diagnosis not present

## 2023-11-01 LAB — RAD ONC ARIA SESSION SUMMARY
Course Elapsed Days: 41
Plan Fractions Treated to Date: 28
Plan Prescribed Dose Per Fraction: 1.8 Gy
Plan Total Fractions Prescribed: 28
Plan Total Prescribed Dose: 50.4 Gy
Reference Point Dosage Given to Date: 50.4 Gy
Reference Point Session Dosage Given: 1.8 Gy
Session Number: 28

## 2023-11-02 NOTE — Radiation Completion Notes (Addendum)
  Radiation Oncology         [phone] ________________________________  Name: Leslie [name] MRN: [id number]  Date of Service: 11/01/2023  DOB: 07-Aug-1955  End of Treatment Note  Diagnosis: Stage IIIB Vulvar Carcinoma  Intent: Curative     ==========DELIVERED PLANS==========  First Treatment Date: 2023-09-21 Last Treatment Date: 2023-11-01   Plan Name: Pelvis_R Site: Groin, Right Technique: IMRT Mode: Photon Dose Per Fraction: 1.8 Gy Prescribed Dose (Delivered / Prescribed): 50.4 Gy / 50.4 Gy Prescribed Fxs (Delivered / Prescribed): 28 / 28     ====================================   The patient tolerated radiation. She developed fatigue,anticipated skin changes in the treatment field, and bladder urgency. She also developed diarrhea for which she was taking Imodium for.   The patient will return in one month and will continue follow up with Dr. Viktoria as well.      Ronita Due, PA-C

## 2023-11-03 ENCOUNTER — Ambulatory Visit (INDEPENDENT_AMBULATORY_CARE_PROVIDER_SITE_OTHER): Admitting: Obstetrics and Gynecology

## 2023-11-03 ENCOUNTER — Other Ambulatory Visit (HOSPITAL_COMMUNITY)
Admission: RE | Admit: 2023-11-03 | Discharge: 2023-11-03 | Disposition: A | Discharge status: Home / Self Care | Source: Ambulatory Visit | Attending: Obstetrics and Gynecology | Admitting: Obstetrics and Gynecology

## 2023-11-03 ENCOUNTER — Other Ambulatory Visit: Payer: Self-pay

## 2023-11-03 VITALS — BP 135/56 | HR 65 | Wt 242.0 lb

## 2023-11-03 DIAGNOSIS — Z1331 Encounter for screening for depression: Secondary | ICD-10-CM

## 2023-11-03 DIAGNOSIS — C519 Malignant neoplasm of vulva, unspecified: Secondary | ICD-10-CM | POA: Diagnosis not present

## 2023-11-03 DIAGNOSIS — L292 Pruritus vulvae: Secondary | ICD-10-CM

## 2023-11-03 NOTE — Patient Instructions (Signed)
 Apply a little aquaphor

## 2023-11-03 NOTE — Progress Notes (Unsigned)
 GYNECOLOGY VISIT  Patient name: Leslie [name] MRN [id number]  Date of birth: 31-Jul-1955 Chief Complaint:   Follow-up  History:  Leslie [name] is a 68 y.o. G0P0000 being seen today for vulvar itching. Has had a resumption of itching of inner vulva and wondering if clobetasol  should be restarted as well as biopsy. Not sure if there is new lesion but very nervous about having a new lesion as itching and bleeding were her initial symptoms when malignancy initially diagnosed.  Has continued to use bidet.    Past Medical History:  Diagnosis Date   Anal squamous cell carcinoma (HCC)    Anxiety    Arthritis    Depression    History of adenomatous polyp of colon 2015   History of hiatal hernia    Hypothyroidism    IDA (iron deficiency anemia)    hematolody-- [facility] 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   Vulva cancer [facility])     Past Surgical History:  Procedure Laterality Date   BREAST REDUCTION SURGERY  2001   COLONOSCOPY WITH PROPOFOL   06/04/2013   dr avram   DILATION AND CURETTAGE OF UTERUS     yrs ago   INGUINAL LYMPHADENECTOMY Right 08/03/2023   Procedure: LYMPHADENECTOMY, INGUINAL, OPEN; VULVA BIOPSY;  Surgeon: Viktoria Comer SAUNDERS, MD;  Location: [facility];  Service: Gynecology;  Laterality: Right;   LAPAROSCOPIC GASTRIC BANDING WITH HIATAL HERNIA REPAIR  03/28/2006   @ [facility] by dr b. mikell   LAPAROSCOPIC ROUX-EN-Y GASTRIC BYPASS WITH UPPER ENDOSCOPY AND REMOVAL OF LAP BAND  02/26/2019   @ [facility] by dr j. dasher   LESION REMOVAL N/A 12/30/2022   Procedure: EXCISION BIOPSY PERIANAL LESION;  Surgeon: Debby Hila, MD;  Location: [facility];  Service: General;  Laterality: N/A;   VULVA MARYBETH BIOPSY N/A 04/13/2023   Procedure: VULVAR BIOPSY WITH PAPSMERE;  Surgeon: Jeralyn Crutch, MD;   Location: [facility];  Service: Gynecology;  Laterality: N/A;   VULVECTOMY N/A 05/09/2023   Procedure: PARTIAL MODIFIED RADICAL VULVECTOMY;  Surgeon: Viktoria Comer SAUNDERS, MD;  Location: [facility];  Service: Gynecology;  Laterality: N/A;   WISDOM TOOTH EXTRACTION      The following portions of the patient's history were reviewed and updated as appropriate: allergies, current medications, past family history, past medical history, past social history, past surgical history and problem list.   Health Maintenance:   Last pap     Component Value Date/Time   DIAGPAP  04/13/2023 0807    - Negative for intraepithelial lesion or malignancy (NILM)   HPVHIGH Negative 05/09/2023 1419   ADEQPAP  04/13/2023 0807    Satisfactory for evaluation; transformation zone component ABSENT.    Last mammogram: birads 2 2025   Review of Systems:  Pertinent items are noted in HPI. Comprehensive review of systems was otherwise negative.   Objective:  Physical Exam BP (!) 135/56   Pulse 65   Wt 242 lb (109.8 kg)   BMI 44.26 kg/m    Physical Exam Vitals and nursing note reviewed. Exam conducted with a chaperone present.  Constitutional:      Appearance: Normal appearance.  HENT:     Head: Normocephalic and atraumatic.  Pulmonary:     Effort: Pulmonary effort is normal.     Breath sounds: Normal breath sounds.  Genitourinary:  General: Normal vulva.     Exam position: Lithotomy position.     Vagina: Normal.     Cervix: Normal.     Comments: S/p vulvectomy ~11mm white lesion on inner right vulva that provided pruritic sensation  Scattered mild erythema  Atrophic vaginal introitus  Skin:    General: Skin is warm and dry.  Neurological:     General: No focal deficit present.     Mental Status: She is alert.  Psychiatric:        Mood and Affect: Mood normal.        Behavior: Behavior normal.        Thought Content: Thought content normal.        Judgment: Judgment normal.      VULVAR BIOPSY NOTE The indications for vulvar biopsy (rule out neoplasia, establish lichen sclerosus diagnosis) were reviewed.   Risks of the biopsy including pain, bleeding, infection, inadequate specimen, scarring and need for additional procedures  were discussed. The patient stated understanding and agreed to undergo procedure today. Consent was signed,  time out performed.  Chaperone was present during entire procedure. The patient's vulva was prepped with Betadine . 1% lidocaine  was injected into inner right vulva. A 3-mm punch biopsy was done x2, biopsy tissue was picked up with sterile forceps and sterile scissors were used to excise the lesion.  Small bleeding was noted and hemostasis was achieved using silver nitrate sticks.  The patient tolerated the procedure well. Post-procedure instructions  (pelvic rest for one week) were given to the patient. The patient is to call with heavy bleeding, fever greater than 100.4, foul smelling vaginal discharge or other concerns. The patient will be return to clinic in two weeks for discussion of results.      Assessment & Plan:   1. Vulvar pruritus (Primary) 2. Primary vulvar squamous cell carcinoma (HCC) History of vulvar SCC s/p vulvectomy and CO2 laser ablation of VIN with resumption of vulvar pruritus. 2 small lesions noted on vulva, possibly secondary to ablative therapy vs GSM, will follow up with surgical pathology. Will reach out to gyn onc for additional guidance. Suspect vaginal estrogen should be safe in the setting of history of SCC and input regarding management of pruritus possibly secondary to laser therapy.  - Surgical pathology    Carter Quarry, MD [facility] for [facility], [facility]

## 2023-11-07 LAB — SURGICAL PATHOLOGY

## 2023-11-09 ENCOUNTER — Ambulatory Visit: Payer: Self-pay | Admitting: Obstetrics and Gynecology

## 2023-11-10 ENCOUNTER — Telehealth: Payer: Self-pay | Admitting: *Deleted

## 2023-11-10 NOTE — Telephone Encounter (Signed)
 Called patient, no answer, LVM - noted it was daughter's number.   Called listed number for patient. Reviewed pathology results demonstrating VIN with focus of possible high grade without evidence of carcinoma. Previously reached out to gyn onc Dr. Viktoria - given symptoms and focus that is possibly high grade dysplasia, it is reasonable to repeat laser or apply topical treatment. Follow up scheduled. Patient agrees with plan. All questions answered.    FINAL MICROSCOPIC DIAGNOSIS:   A. VULVA, INNER RIGHT, BIOPSY:  Squamous dysplasia.  Negative for invasive carcinoma.   COMMENT:   There is squamous atypia consistent with low-grade squamous  intraepithelial lesion, VIN-1 (low-grade dysplasia.  There is a  microscopic focus with marked inflammation and possible erosion with  marked squamous atypia and focal high-grade dysplasia, VIN-3 cannot be  ruled out in this microscopic focus.  No invasive carcinoma is  identified.

## 2023-11-10 NOTE — Telephone Encounter (Signed)
 Per Dr Viktoria patient scheduled for a follow up appt

## 2023-11-15 ENCOUNTER — Encounter: Payer: Self-pay | Admitting: Gynecologic Oncology

## 2023-11-17 ENCOUNTER — Inpatient Hospital Stay

## 2023-11-17 ENCOUNTER — Inpatient Hospital Stay: Attending: Gynecologic Oncology | Admitting: Gynecologic Oncology

## 2023-11-17 ENCOUNTER — Encounter: Payer: Self-pay | Admitting: Gynecologic Oncology

## 2023-11-17 VITALS — BP 140/62 | HR 63 | Temp 98.4°F | Resp 20 | Wt 246.4 lb

## 2023-11-17 DIAGNOSIS — Z923 Personal history of irradiation: Secondary | ICD-10-CM | POA: Insufficient documentation

## 2023-11-17 DIAGNOSIS — C519 Malignant neoplasm of vulva, unspecified: Secondary | ICD-10-CM

## 2023-11-17 DIAGNOSIS — Z8544 Personal history of malignant neoplasm of other female genital organs: Secondary | ICD-10-CM | POA: Diagnosis present

## 2023-11-17 DIAGNOSIS — C774 Secondary and unspecified malignant neoplasm of inguinal and lower limb lymph nodes: Secondary | ICD-10-CM | POA: Diagnosis present

## 2023-11-17 DIAGNOSIS — L292 Pruritus vulvae: Secondary | ICD-10-CM | POA: Diagnosis not present

## 2023-11-17 DIAGNOSIS — N903 Dysplasia of vulva, unspecified: Secondary | ICD-10-CM

## 2023-11-17 DIAGNOSIS — N904 Leukoplakia of vulva: Secondary | ICD-10-CM | POA: Diagnosis not present

## 2023-11-17 NOTE — H&P (View-Only) (Signed)
 Gynecologic Oncology Return Clinic Visit  11/17/23  Reason for Visit: follow-up  Treatment History: Oncology History Overview Note  Presented with vulvar pruritus   Vulvar cancer (HCC)  04/13/2023 Surgery   EUA, vulvar biopsy, pap test FINDINGS:  Erythema in groin folds and along inner labia majora, atrophic vaginal introitus with atrophic urethral meatus, normal appearing cervix, thickened and friable clitoral hood, 1.5cm ulcerated lesion on upper inner right labia  SPECIMENS: left labial biopsy, right labial biopsy, clitoral hood biopsy, cervical pap smear    04/13/2023 Initial Biopsy   A. LABIA, RIGHT INNER, BIOPSY:  - Invasive moderately differentiated squamous cell carcinoma, see  comment  B. CLITORAL, BIOPSY:  - Invasive moderately differentiated squamous cell carcinoma, see  comment  C. 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  Moderate hypermetabolism in the anal region, SUV max 7.7.  No pelvic adenopathy.  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  Comment that there are swirling vessels in the root of the small bowel mesentery likely due to loose mesenteric attachment   09/22/2023 - 11/01/2023 Radiation Therapy   IMRT to right inguinal lymph nodes,      Interval History: Patient is overall doing well although has some vulvar irritation and pruritus at the lesion recently biopsied.  Tolerated radiation very well.  Still having some diarrhea, using Imodium as needed.  Also experiencing some urinary frequency.  Energy is recovering.  Past Medical/Surgical History: Past Medical History:  Diagnosis Date   Anal squamous cell carcinoma (HCC)    Anxiety    Arthritis    Depression    History of adenomatous polyp of colon 2015    History of hiatal hernia    Hypothyroidism    IDA (iron deficiency anemia)    hematolody-- dr a. 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   Vulva cancer [facility])     Past Surgical History:  Procedure Laterality Date   BREAST REDUCTION SURGERY  2001   COLONOSCOPY WITH PROPOFOL   06/04/2013   dr avram   DILATION AND CURETTAGE OF UTERUS     yrs ago   INGUINAL LYMPHADENECTOMY Right 08/03/2023   Procedure: LYMPHADENECTOMY, INGUINAL, OPEN; VULVA BIOPSY;  Surgeon: Viktoria Comer SAUNDERS, MD;  Location[facility];  Service: Gynecology;  Laterality: Right;   LAPAROSCOPIC GASTRIC BANDING WITH HIATAL HERNIA REPAIR  03/28/2006   @ [facility] by dr b. mikell   LAPAROSCOPIC ROUX-EN-Y GASTRIC BYPASS WITH UPPER ENDOSCOPY AND REMOVAL OF LAP BAND  02/26/2019   @ [facility] by dr j. dasher   LESION REMOVAL N/A 12/30/2022   Procedure: EXCISION BIOPSY PERIANAL LESION;  Surgeon: Debby Hila, MD;  Location: [facility];  Service: General;  Laterality: N/A;   VULVA Leslie [name] BIOPSY N/A 04/13/2023   Procedure: VULVAR BIOPSY WITH PAPSMERE;  Surgeon: Jeralyn Crutch, MD;  Location: [facility];  Service: Gynecology;  Laterality: N/A;   VULVECTOMY N/A 05/09/2023   Procedure: PARTIAL MODIFIED RADICAL VULVECTOMY;  Surgeon: Viktoria Comer SAUNDERS, MD;  Location[facility];  Service: Gynecology;  Laterality: N/A;   WISDOM TOOTH EXTRACTION      Family History  Problem Relation Age of Onset   Heart attack Mother    Breast cancer Mother    Heart attack Father    Breast cancer Maternal Grandmother        colon cancer   Colon cancer Maternal Grandmother    Diabetes Neg Hx    Hyperlipidemia Neg Hx    Hypertension Neg Hx    Sudden death Neg Hx    Prostate cancer Neg Hx    Pancreatic cancer Neg Hx    Ovarian cancer Neg Hx    Endometrial  cancer Neg Hx     Social History   Socioeconomic History   Marital status: Single    Spouse name: Not on file   Number of children: Not on file   Years of education: Not on file   Highest education level: Not on file  Occupational History   Not on file  Tobacco Use   Smoking status: Former    Types: Cigarettes   Smokeless tobacco: Never   Tobacco comments:    04-12-2023  quit smoking 2000,  started age 82  Vaping Use   Vaping status: Never Used  Substance and Sexual Activity   Alcohol use: Not Currently    Comment: rare   Drug use: Never   Sexual activity: Not on file  Other Topics Concern   Not on file  Social History Narrative   Not on file   Social Drivers of Health   Financial Resource Strain: Low Risk  (08/29/2023)   Received from [facility]   Overall Financial Resource Strain (CARDIA)    Difficulty of Paying Living Expenses: Not hard at all  Food Insecurity: No Food Insecurity (11/03/2023)   Hunger Vital Sign    Worried About Running Out of Food in the Last Year: Never true    Ran Out of Food in the Last Year: Never true  Transportation Needs: No Transportation Needs (08/29/2023)   Received from [facility] - Transportation    Lack of Transportation (Medical): No    Lack of Transportation (Non-Medical): No  Physical Activity: Unknown (08/29/2023)   Received from [facility]   Exercise Vital Sign    On average, how many days per week do you engage in moderate to strenuous exercise (like a brisk walk)?: 0 days    Minutes of Exercise per Session: Not on file  Stress: No Stress Concern Present (08/29/2023)   Received from [facility] Occupational [facility] - Occupational Stress Questionnaire    Feeling of Stress : Only a little  Social Connections: Moderately Integrated (08/29/2023)   Received from [facility]   Social Network    How would you rate your social network (family, work, friends)?: Adequate participation with social  networks    Current Medications:  Current Outpatient Medications:    acetaminophen  (TYLENOL ) 500 MG tablet, Take 500 mg by mouth every 6 (six) hours as needed for moderate pain (pain score 4-6)., Disp: , Rfl:    BIOTIN PO, Take 6,000 mcg by mouth daily., Disp: , Rfl:    Calcium  Carb-Cholecalciferol (CALCIUM  500 + D PO), Take 2 tablets by mouth daily., Disp: , Rfl:    Cholecalciferol 50 MCG (2000 UT) TABS, Take 4,000 Units by mouth daily., Disp: , Rfl:    clobetasol  ointment (TEMOVATE ) 0.05 %, Apply to affected area every night for 4 weeks, then every other day for 4 weeks and then twice a week for 4 weeks or until resolution. (Patient  taking differently: Apply 1 Application topically daily.), Disp: 30 g, Rfl: 5   Ferrous Sulfate (IRON PO), Take 26 mg by mouth daily., Disp: , Rfl:    fluconazole  (DIFLUCAN ) 150 MG tablet, Take 1 tablet (150 mg total) by mouth daily. Take one dose when you pick up the prescription and second dose 72  hours later (Patient not taking: Reported on 11/03/2023), Disp: 2 tablet, Rfl: 1   FLUoxetine  (PROZAC ) 40 MG capsule, Take 40 mg by mouth at bedtime., Disp: , Rfl:    hydrOXYzine  (ATARAX ) 25 MG tablet, TAKE 1 TABLET(25 MG) BY MOUTH AT BEDTIME AS NEEDED FOR ITCHING, Disp: 30 tablet, Rfl: 2   levothyroxine  (SYNTHROID ) 137 MCG tablet, Take 137 mcg by mouth daily before breakfast., Disp: , Rfl:    loratadine (CLARITIN) 10 MG tablet, Take 10 mg by mouth daily as needed for allergies., Disp: , Rfl:    mineral oil-hydrophilic petrolatum  (AQUAPHOR) ointment, Apply topically as needed for dry skin. (Patient not taking: Reported on 11/03/2023), Disp: 420 g, Rfl: 0   Multiple Vitamin (MULTIVITAMIN) capsule, Take 2 capsules by mouth daily., Disp: , Rfl:    nystatin  (MYCOSTATIN /NYSTOP ) powder, Apply 1 Application topically 3 (three) times daily., Disp: 15 g, Rfl: 1   traMADol  (ULTRAM ) 50 MG tablet, Take 1 tablet (50 mg total) by mouth every 6 (six) hours as needed for severe pain  (pain score 7-10). For AFTER surgery only, do not take and drive (Patient not taking: Reported on 11/03/2023), Disp: 10 tablet, Rfl: 0  Review of Systems: + Fatigue, diarrhea, frequency, wound Denies appetite changes, fevers, chills, unexplained weight changes. Denies hearing loss, neck lumps or masses, mouth sores, ringing in ears or voice changes. Denies cough or wheezing.  Denies shortness of breath. Denies chest pain or palpitations. Denies leg swelling. Denies abdominal distention, pain, blood in stools, constipation, nausea, vomiting, or early satiety. Denies pain with intercourse, dysuria, hematuria or incontinence. Denies hot flashes, pelvic pain, vaginal bleeding or vaginal discharge.   Denies joint pain, back pain or muscle pain/cramps. Denies itching, rash. Denies dizziness, headaches, numbness or seizures. Denies swollen lymph nodes or glands, denies easy bruising or bleeding. Denies anxiety, depression, confusion, or decreased concentration.  Physical Exam: BP (!) 140/62 (BP Location: Left Arm, Patient Position: Sitting)   Pulse 63   Temp 98.4 F (36.9 C) (Oral)   Resp 20   Wt 246 lb 6.4 oz (111.8 kg)   SpO2 96%   BMI 45.07 kg/m  General: Alert, oriented, no acute distress. HEENT: Posterior oropharynx clear, sclera anicteric. Chest: Clear to auscultation bilaterally.  No wheezes or rhonchi. Cardiovascular: Regular rate and rhythm, no murmurs. Extremities: Grossly normal range of motion.  Warm, well perfused.  No edema bilaterally. Lymphatics: No cervical, supraclavicular, or inguinal adenopathy. GU: External genitalia notable for changes related to prior surgery.  Along the right vulva, from approximately 8-10:00, there is an area of desquamation that spans from the labia to the inner vagina with 2 small areas of punctation along the more lateral aspect of desquamation.  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  Laboratory & Radiologic  Studies: A. VULVA, INNER RIGHT, BIOPSY:  Squamous dysplasia.  Negative for invasive carcinoma.  See comment.   COMMENT:   There is squamous atypia consistent with low-grade squamous  intraepithelial lesion, VIN-1 (low-grade dysplasia.  There is a  microscopic focus with marked inflammation and possible erosion with  marked squamous atypia and focal high-grade dysplasia, VIN-3 cannot be  ruled out  in this microscopic focus.  No invasive carcinoma is  identified.   Assessment & Plan: Leslie [name] is a 68 y.o. 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 Completed adjuvant radiation to the right groin in mid August. Given vulvar symptoms, underwent recent biopsy showing low-grade dysplasia with a focal area concerning for inflammation versus high-grade dysplasia.  Given the patient's symptoms, we discussed that in the setting of not being able to rule out high-grade dysplasia, I would recommend that we proceed with treatment.  I recommend some additional biopsies and laser ablation of the more concerning appearing areas.  Much of the area on the right labia and spanning into the distal vagina appears more consistent with lichen sclerosis or low-grade dysplasia in the setting of lichen sclerosis.  Discussed with the patient that I may send some biopsies for frozen section to help guide how much of this should be treated with laser.  We also discussed that in the setting of her HPV independent disease, that clobetasol  can be used for symptomatic relief.  This will not prevent the development of dysplasia or cancer but can help with symptoms like pruritus.  Reviewed plan for exam under anesthesia, vulvar biopsies, laser ablation.  Discussed risk which include but are not limited to bleeding, need for blood transfusion, infection, damage to surrounding structures, VTE, and risk related to anesthesia.  The patient had the  opportunity to ask questions today.  Peri-operative instructions were reviewed with her.  26 minutes of total time was spent for this patient encounter, including preparation, face-to-face counseling with the patient and coordination of care, and documentation of the encounter.  Comer Dollar, MD  Division of Gynecologic Oncology  Department of Obstetrics and Gynecology  [facility]

## 2023-11-17 NOTE — Progress Notes (Signed)
 Gynecologic Oncology Return Clinic Visit  11/17/23  Reason for Visit: follow-up  Treatment History: Oncology History Overview Note  Presented with vulvar pruritus   Vulvar cancer (HCC)  04/13/2023 Surgery   EUA, vulvar biopsy, pap test FINDINGS:  Erythema in groin folds and along inner labia majora, atrophic vaginal introitus with atrophic urethral meatus, normal appearing cervix, thickened and friable clitoral hood, 1.5cm ulcerated lesion on upper inner right labia  SPECIMENS: left labial biopsy, right labial biopsy, clitoral hood biopsy, cervical pap smear    04/13/2023 Initial Biopsy   A. LABIA, RIGHT INNER, BIOPSY:  - Invasive moderately differentiated squamous cell carcinoma, see  comment  B. CLITORAL, BIOPSY:  - Invasive moderately differentiated squamous cell carcinoma, see  comment  C. 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  Moderate hypermetabolism in the anal region, SUV max 7.7.  No pelvic adenopathy.  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  Comment that there are swirling vessels in the root of the small bowel mesentery likely due to loose mesenteric attachment   09/22/2023 - 11/01/2023 Radiation Therapy   IMRT to right inguinal lymph nodes,      Interval History: Patient is overall doing well although has some vulvar irritation and pruritus at the lesion recently biopsied.  Tolerated radiation very well.  Still having some diarrhea, using Imodium as needed.  Also experiencing some urinary frequency.  Energy is recovering.  Past Medical/Surgical History: Past Medical History:  Diagnosis Date   Anal squamous cell carcinoma (HCC)    Anxiety    Arthritis    Depression    History of adenomatous polyp of colon 2015    History of hiatal hernia    Hypothyroidism    IDA (iron deficiency anemia)    hematolody-- dr a. 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   Vulva cancer [facility])     Past Surgical History:  Procedure Laterality Date   BREAST REDUCTION SURGERY  2001   COLONOSCOPY WITH PROPOFOL   06/04/2013   dr avram   DILATION AND CURETTAGE OF UTERUS     yrs ago   INGUINAL LYMPHADENECTOMY Right 08/03/2023   Procedure: LYMPHADENECTOMY, INGUINAL, OPEN; VULVA BIOPSY;  Surgeon: Viktoria Comer SAUNDERS, MD;  Location: [facility];  Service: Gynecology;  Laterality: Right;   LAPAROSCOPIC GASTRIC BANDING WITH HIATAL HERNIA REPAIR  03/28/2006   @ [facility] by dr b. mikell   LAPAROSCOPIC ROUX-EN-Y GASTRIC BYPASS WITH UPPER ENDOSCOPY AND REMOVAL OF LAP BAND  02/26/2019   @ [facility] by dr j. dasher   LESION REMOVAL N/A 12/30/2022   Procedure: EXCISION BIOPSY PERIANAL LESION;  Surgeon: Debby Hila, MD;  Location: [facility];  Service: General;  Laterality: N/A;   VULVA MARYBETH BIOPSY N/A 04/13/2023   Procedure: VULVAR BIOPSY WITH PAPSMERE;  Surgeon: Jeralyn Crutch, MD;  Location: [facility];  Service: Gynecology;  Laterality: N/A;   VULVECTOMY N/A 05/09/2023   Procedure: PARTIAL MODIFIED RADICAL VULVECTOMY;  Surgeon: Viktoria Comer SAUNDERS, MD;  Location: [facility];  Service: Gynecology;  Laterality: N/A;   WISDOM TOOTH EXTRACTION      Family History  Problem Relation Age of Onset   Heart attack Mother    Breast cancer Mother    Heart attack Father    Breast cancer Maternal Grandmother        colon cancer   Colon cancer Maternal Grandmother    Diabetes Neg Hx    Hyperlipidemia Neg Hx    Hypertension Neg Hx    Sudden death Neg Hx    Prostate cancer Neg Hx    Pancreatic cancer Neg Hx    Ovarian cancer Neg Hx    Endometrial  cancer Neg Hx     Social History   Socioeconomic History   Marital status: Single    Spouse name: Not on file   Number of children: Not on file   Years of education: Not on file   Highest education level: Not on file  Occupational History   Not on file  Tobacco Use   Smoking status: Former    Types: Cigarettes   Smokeless tobacco: Never   Tobacco comments:    04-12-2023  quit smoking 2000,  started age 82  Vaping Use   Vaping status: Never Used  Substance and Sexual Activity   Alcohol use: Not Currently    Comment: rare   Drug use: Never   Sexual activity: Not on file  Other Topics Concern   Not on file  Social History Narrative   Not on file   Social Drivers of Health   Financial Resource Strain: Low Risk  (08/29/2023)   Received from [facility]   Overall Financial Resource Strain (CARDIA)    Difficulty of Paying Living Expenses: Not hard at all  Food Insecurity: No Food Insecurity (11/03/2023)   Hunger Vital Sign    Worried About Running Out of Food in the Last Year: Never true    Ran Out of Food in the Last Year: Never true  Transportation Needs: No Transportation Needs (08/29/2023)   Received from [facility] - Transportation    Lack of Transportation (Medical): No    Lack of Transportation (Non-Medical): No  Physical Activity: Unknown (08/29/2023)   Received from [facility]   Exercise Vital Sign    On average, how many days per week do you engage in moderate to strenuous exercise (like a brisk walk)?: 0 days    Minutes of Exercise per Session: Not on file  Stress: No Stress Concern Present (08/29/2023)   Received from [facility] of Occupational [facility] - Occupational Stress Questionnaire    Feeling of Stress : Only a little  Social Connections: Moderately Integrated (08/29/2023)   Received from [facility]   Social Network    How would you rate your social network (family, work, friends)?: Adequate participation with social  networks    Current Medications:  Current Outpatient Medications:    acetaminophen  (TYLENOL ) 500 MG tablet, Take 500 mg by mouth every 6 (six) hours as needed for moderate pain (pain score 4-6)., Disp: , Rfl:    BIOTIN PO, Take 6,000 mcg by mouth daily., Disp: , Rfl:    Calcium  Carb-Cholecalciferol (CALCIUM  500 + D PO), Take 2 tablets by mouth daily., Disp: , Rfl:    Cholecalciferol 50 MCG (2000 UT) TABS, Take 4,000 Units by mouth daily., Disp: , Rfl:    clobetasol  ointment (TEMOVATE ) 0.05 %, Apply to affected area every night for 4 weeks, then every other day for 4 weeks and then twice a week for 4 weeks or until resolution. (Patient  taking differently: Apply 1 Application topically daily.), Disp: 30 g, Rfl: 5   Ferrous Sulfate (IRON PO), Take 26 mg by mouth daily., Disp: , Rfl:    fluconazole  (DIFLUCAN ) 150 MG tablet, Take 1 tablet (150 mg total) by mouth daily. Take one dose when you pick up the prescription and second dose 72  hours later (Patient not taking: Reported on 11/03/2023), Disp: 2 tablet, Rfl: 1   FLUoxetine  (PROZAC ) 40 MG capsule, Take 40 mg by mouth at bedtime., Disp: , Rfl:    hydrOXYzine  (ATARAX ) 25 MG tablet, TAKE 1 TABLET(25 MG) BY MOUTH AT BEDTIME AS NEEDED FOR ITCHING, Disp: 30 tablet, Rfl: 2   levothyroxine  (SYNTHROID ) 137 MCG tablet, Take 137 mcg by mouth daily before breakfast., Disp: , Rfl:    loratadine (CLARITIN) 10 MG tablet, Take 10 mg by mouth daily as needed for allergies., Disp: , Rfl:    mineral oil-hydrophilic petrolatum  (AQUAPHOR) ointment, Apply topically as needed for dry skin. (Patient not taking: Reported on 11/03/2023), Disp: 420 g, Rfl: 0   Multiple Vitamin (MULTIVITAMIN) capsule, Take 2 capsules by mouth daily., Disp: , Rfl:    nystatin  (MYCOSTATIN /NYSTOP ) powder, Apply 1 Application topically 3 (three) times daily., Disp: 15 g, Rfl: 1   traMADol  (ULTRAM ) 50 MG tablet, Take 1 tablet (50 mg total) by mouth every 6 (six) hours as needed for severe pain  (pain score 7-10). For AFTER surgery only, do not take and drive (Patient not taking: Reported on 11/03/2023), Disp: 10 tablet, Rfl: 0  Review of Systems: + Fatigue, diarrhea, frequency, wound Denies appetite changes, fevers, chills, unexplained weight changes. Denies hearing loss, neck lumps or masses, mouth sores, ringing in ears or voice changes. Denies cough or wheezing.  Denies shortness of breath. Denies chest pain or palpitations. Denies leg swelling. Denies abdominal distention, pain, blood in stools, constipation, nausea, vomiting, or early satiety. Denies pain with intercourse, dysuria, hematuria or incontinence. Denies hot flashes, pelvic pain, vaginal bleeding or vaginal discharge.   Denies joint pain, back pain or muscle pain/cramps. Denies itching, rash. Denies dizziness, headaches, numbness or seizures. Denies swollen lymph nodes or glands, denies easy bruising or bleeding. Denies anxiety, depression, confusion, or decreased concentration.  Physical Exam: BP (!) 140/62 (BP Location: Left Arm, Patient Position: Sitting)   Pulse 63   Temp 98.4 F (36.9 C) (Oral)   Resp 20   Wt 246 lb 6.4 oz (111.8 kg)   SpO2 96%   BMI 45.07 kg/m  General: Alert, oriented, no acute distress. HEENT: Posterior oropharynx clear, sclera anicteric. Chest: Clear to auscultation bilaterally.  No wheezes or rhonchi. Cardiovascular: Regular rate and rhythm, no murmurs. Extremities: Grossly normal range of motion.  Warm, well perfused.  No edema bilaterally. Lymphatics: No cervical, supraclavicular, or inguinal adenopathy. GU: External genitalia notable for changes related to prior surgery.  Along the right vulva, from approximately 8-10:00, there is an area of desquamation that spans from the labia to the inner vagina with 2 small areas of punctation along the more lateral aspect of desquamation.  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  Laboratory & Radiologic  Studies: A. VULVA, INNER RIGHT, BIOPSY:  Squamous dysplasia.  Negative for invasive carcinoma.  See comment.   COMMENT:   There is squamous atypia consistent with low-grade squamous  intraepithelial lesion, VIN-1 (low-grade dysplasia.  There is a  microscopic focus with marked inflammation and possible erosion with  marked squamous atypia and focal high-grade dysplasia, VIN-3 cannot be  ruled out  in this microscopic focus.  No invasive carcinoma is  identified.   Assessment & Plan: Leslie [name] is a 68 y.o. 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 Completed adjuvant radiation to the right groin in mid August. Given vulvar symptoms, underwent recent biopsy showing low-grade dysplasia with a focal area concerning for inflammation versus high-grade dysplasia.  Given the patient's symptoms, we discussed that in the setting of not being able to rule out high-grade dysplasia, I would recommend that we proceed with treatment.  I recommend some additional biopsies and laser ablation of the more concerning appearing areas.  Much of the area on the right labia and spanning into the distal vagina appears more consistent with lichen sclerosis or low-grade dysplasia in the setting of lichen sclerosis.  Discussed with the patient that I may send some biopsies for frozen section to help guide how much of this should be treated with laser.  We also discussed that in the setting of her HPV independent disease, that clobetasol  can be used for symptomatic relief.  This will not prevent the development of dysplasia or cancer but can help with symptoms like pruritus.  Reviewed plan for exam under anesthesia, vulvar biopsies, laser ablation.  Discussed risk which include but are not limited to bleeding, need for blood transfusion, infection, damage to surrounding structures, VTE, and risk related to anesthesia.  The patient had the  opportunity to ask questions today.  Peri-operative instructions were reviewed with her.  26 minutes of total time was spent for this patient encounter, including preparation, face-to-face counseling with the patient and coordination of care, and documentation of the encounter.  Comer Dollar, MD  Division of Gynecologic Oncology  Department of Obstetrics and Gynecology  [facility]

## 2023-11-17 NOTE — Patient Instructions (Signed)
 Preparing for your Surgery  Plan for surgery on December 07, 2023 with Dr. Comer Dollar at [facility]. You will be scheduled for pelvic examination under anesthesia, vulvar laser, vulvar biopsies.   Pre-operative Testing -You will receive a phone call from presurgical testing at [facility] to discuss surgery instructions and arrange for lab work if needed.  -Bring your insurance card, copy of an advanced directive if applicable, medication list.  -You should not be taking blood thinners or aspirin  at least ten days prior to surgery unless instructed by your surgeon.  -Do not take supplements such as fish oil (omega 3), red yeast rice, turmeric before your surgery. You want to avoid medications with aspirin  in them including headache powders such as BC or Goody's), Excedrin migraine.  Day Before Surgery at Home -You will be advised you can have clear liquids up until 3 hours before your surgery.    Your role in recovery Your role is to become active as soon as directed by your doctor, while still giving yourself time to heal.  Rest when you feel tired. You will be asked to do the following in order to speed your recovery:  - Cough and breathe deeply. This helps to clear and expand your lungs and can prevent pneumonia after surgery.  - STAY ACTIVE WHEN YOU GET HOME. Do mild physical activity. Walking or moving your legs help your circulation and body functions return to normal. Do not try to get up or walk alone the first time after surgery.   -If you develop swelling on one leg or the other, pain in the back of your leg, redness/warmth in one of your legs, please call the office or go to the Emergency Room to have a doppler to rule out a blood clot. For shortness of breath, chest pain-seek care in the Emergency Room as soon as possible. - Actively manage your pain. Managing your pain lets you move in comfort. We will ask you to rate your pain on a scale of zero to 10. It  is your responsibility to tell your doctor or nurse where and how much you hurt so your pain can be treated.  Special Considerations -Your final pathology results from surgery should be available around one week after surgery and the results will be relayed to you when available.  -FMLA forms can be faxed to [phone] and please allow 5-7 business days for completion.  Pain Management After Surgery -You will be prescribed your pain medication and bowel regimen medications before surgery so that you can have these available when you are discharged from the hospital. The pain medication is for use ONLY AFTER surgery and a new prescription will not be given.   -Make sure that you have Tylenol  and Ibuprofen  at home IF YOU ARE ABLE TO TAKE THESE MEDICATIONS to use on a regular basis after surgery for pain control. We recommend alternating the medications every hour to six hours since they work differently and are processed in the body differently for pain relief.  -Review the attached handout on narcotic use and their risks and side effects.   Bowel Regimen -You will be prescribed Sennakot-S to take nightly to prevent constipation especially if you are taking the narcotic pain medication intermittently.  It is important to prevent constipation and drink adequate amounts of liquids. You can stop taking this medication when you are not taking pain medication and you are back on your normal bowel routine.  Risks of Surgery Risks  of surgery are low but include bleeding, infection, damage to surrounding structures, re-operation, blood clots, and very rarely death.  AFTER SURGERY INSTRUCTIONS  Return to work:  1-2 weeks if applicable, variable based on occupation  We recommend purchasing several bags of frozen green peas and dividing them into ziploc bags. You will want to keep these in the freezer and have them ready to use as ice packs to the vulvar incision. Once the ice pack is no longer cold,  you can get another from the freezer. The frozen peas mold to your body better than a regular ice pack.  The lidocaine  is as needed application to the lasered area for discomfort up to 3 times daily. You may also be given a topical burn cream called silvadene that can assist with healing of the skin.   Activity: 1. Be up and out of the bed during the day.  Take a nap if needed.  You may walk up steps but be careful and use the hand rail.  Stair climbing will tire you more than you think, you may need to stop part way and rest.   2. No lifting or straining for 1 week over 10 pounds. No pushing, pulling, straining for 1 week.  3. No driving for minimum 24 hours after surgery but this is typically longer until the following criteria have been met: Do not drive if you are taking narcotic pain medicine and make sure that your reaction time has returned.   4. You can shower as soon as the next day after surgery. Shower daily. No tub baths or submerging your body in water until cleared by your surgeon. If you have the soap that was given to you by pre-surgical testing that was used before surgery, you do not need to use it afterwards because this can irritate your incisions.   5. No sexual activity and nothing in the vagina for 4-6 weeks.  6. You may experience vulvar spotting and discharge after surgery.  The spotting is normal but if you experience heavy bleeding, call our office.  7. Take Tylenol  or ibuprofen  first for pain if you are able to take these medications and only use narcotic pain medication for severe pain not relieved by the Tylenol  or Ibuprofen .  Monitor your Tylenol  intake to a max of 4,000 mg in a 24 hour period. You can alternate these medications after surgery.  Diet: 1. Low sodium Heart Healthy Diet is recommended but you are cleared to resume your normal (before surgery) diet after your procedure.  2. It is safe to use a laxative, such as Miralax or Colace, if you have  difficulty moving your bowels. You have been prescribed Sennakot at bedtime every evening to keep bowel movements regular and to prevent constipation.    Wound Care: 1. Keep clean and dry.  Shower daily.  Reasons to call the Doctor: Fever - Oral temperature greater than 100.4 degrees Fahrenheit Foul-smelling vaginal discharge Difficulty urinating Nausea and vomiting Increased pain at the site of the incision that is unrelieved with pain medicine. Difficulty breathing with or without chest pain New calf pain especially if only on one side Sudden, continuing increased vaginal bleeding with or without clots.   Contacts: For questions or concerns you should contact:  Dr. Comer Dollar at [phone]  Eleanor Epps, NP at [phone]  After Hours: call [phone] and have the GYN Oncologist paged/contacted (after 5 pm or on the weekends).  Messages sent via mychart are for non-urgent matters and  are not responded to after hours so for urgent needs, please call the after hours number.

## 2023-11-22 ENCOUNTER — Telehealth: Payer: Self-pay | Admitting: Oncology

## 2023-11-22 NOTE — Telephone Encounter (Signed)
 Leslie [name] called back and would like the 11/24/23 surgery with Dr. Viktoria.  Let her know that pre op at [facility] should be calling her with an appointment.

## 2023-11-22 NOTE — Telephone Encounter (Signed)
 Left a message regarding moving surgery to 11/24/23.  Requested a return call.

## 2023-11-23 ENCOUNTER — Telehealth: Payer: Self-pay | Admitting: Oncology

## 2023-11-23 ENCOUNTER — Other Ambulatory Visit: Payer: Self-pay

## 2023-11-23 ENCOUNTER — Encounter (HOSPITAL_COMMUNITY)
Admission: RE | Admit: 2023-11-23 | Discharge: 2023-11-23 | Disposition: A | Discharge status: Home / Self Care | Source: Ambulatory Visit | Attending: Gynecologic Oncology | Admitting: Gynecologic Oncology

## 2023-11-23 ENCOUNTER — Telehealth: Payer: Self-pay | Admitting: *Deleted

## 2023-11-23 VITALS — Ht 62.0 in | Wt 246.0 lb

## 2023-11-23 DIAGNOSIS — Z01818 Encounter for other preprocedural examination: Secondary | ICD-10-CM

## 2023-11-23 NOTE — Telephone Encounter (Signed)
 Telephone call to check on pre-operative status.  Patient compliant with pre-operative instructions.  Reinforced nothing to eat after midnight. Clear liquids until 0715. Patient to arrive at 0815.  No questions or concerns voiced.  Instructed to call for any needs.

## 2023-11-23 NOTE — Anesthesia Preprocedure Evaluation (Signed)
 Anesthesia Evaluation  Patient identified by MRN, date of birth, ID band Patient awake    Reviewed: Allergy & Precautions, NPO status , Patient's Chart, lab work & pertinent test results  History of Anesthesia Complications Negative for: history of anesthetic complications  Airway Mallampati: II  TM Distance: >3 FB Neck ROM: Full    Dental no notable dental hx.    Pulmonary former smoker   Pulmonary exam normal        Cardiovascular negative cardio ROS Normal cardiovascular exam     Neuro/Psych   Anxiety Depression       GI/Hepatic Neg liver ROS,,,S/P gastric bypass   Endo/Other  Hypothyroidism  Class 3 obesity  Renal/GU negative Renal ROS     Musculoskeletal  (+) Arthritis ,    Abdominal   Peds  Hematology negative hematology ROS (+)   Anesthesia Other Findings   Reproductive/Obstetrics VULVAR DYSPLASIA,HISTORY OF VULVAR CANCER                              Anesthesia Physical Anesthesia Plan  ASA: 3  Anesthesia Plan: General   Post-op Pain Management: Tylenol  PO (pre-op)*   Induction: Intravenous  PONV Risk Score and Plan: 3 and Treatment may vary due to age or medical condition, Ondansetron , Dexamethasone  and Midazolam   Airway Management Planned: LMA  Additional Equipment: None  Intra-op Plan:   Post-operative Plan: Extubation in OR  Informed Consent: I have reviewed the patients History and Physical, chart, labs and discussed the procedure including the risks, benefits and alternatives for the proposed anesthesia with the patient or authorized representative who has indicated his/her understanding and acceptance.     Dental advisory given  Plan Discussed with: CRNA  Anesthesia Plan Comments:          Anesthesia Quick Evaluation

## 2023-11-23 NOTE — Telephone Encounter (Signed)
 Called Leslie [name] regarding the pre op appointment at [facility] hospital today.  She would like to schedule at 12:30 today.  Let her know that it is a phone visit and they will call her at 12:30. She verbalized understanding and agreement.

## 2023-11-23 NOTE — Discharge Instructions (Signed)
 AFTER SURGERY INSTRUCTIONS  I took two biopsies and lasered the area where I biopsied (including where prior biopsy was).    Return to work:  1-2 weeks if applicable, variable based on occupation   We recommend purchasing several bags of frozen green peas and dividing them into ziploc bags. You will want to keep these in the freezer and have them ready to use as ice packs to the vulvar incision. Once the ice pack is no longer cold, you can get another from the freezer. The frozen peas mold to your body better than a regular ice pack.   The lidocaine  is as needed application to the lasered area for discomfort up to 3 times daily. You will also be given a topical burn cream called silvadene  that can assist with healing of the skin.    Activity: 1. Be up and out of the bed during the day.  Take a nap if needed.  You may walk up steps but be careful and use the hand rail.  Stair climbing will tire you more than you think, you may need to stop part way and rest.    2. No lifting or straining for 1 week over 10 pounds. No pushing, pulling, straining for 1 week.   3. No driving for minimum 24 hours after surgery but this is typically longer until the following criteria have been met: Do not drive if you are taking narcotic pain medicine and make sure that your reaction time has returned.    4. You can shower as soon as the next day after surgery. Shower daily. No tub baths or submerging your body in water until cleared by your surgeon. If you have the soap that was given to you by pre-surgical testing that was used before surgery, you do not need to use it afterwards because this can irritate your incisions.    5. No sexual activity and nothing in the vagina for 4-6 weeks.   6. You may experience vulvar spotting and discharge after surgery.  The spotting is normal but if you experience heavy bleeding, call our office.   7. Take Tylenol  or ibuprofen  first for pain if you are able to take these  medications and only use narcotic pain medication for severe pain not relieved by the Tylenol  or Ibuprofen .  Monitor your Tylenol  intake to a max of 4,000 mg in a 24 hour period. You can alternate these medications after surgery.   Diet: 1. Low sodium Heart Healthy Diet is recommended but you are cleared to resume your normal (before surgery) diet after your procedure.   2. It is safe to use a laxative, such as Miralax or Colace, if you have difficulty moving your bowels. You have been prescribed Sennakot at bedtime every evening to keep bowel movements regular and to prevent constipation.     Wound Care: 1. Keep clean and dry.  Shower daily.   Reasons to call the Doctor: Fever - Oral temperature greater than 100.4 degrees Fahrenheit Foul-smelling vaginal discharge Difficulty urinating Nausea and vomiting Increased pain at the site of the incision that is unrelieved with pain medicine. Difficulty breathing with or without chest pain New calf pain especially if only on one side Sudden, continuing increased vaginal bleeding with or without clots.   Contacts: For questions or concerns you should contact:   Leslie [name] at [phone]   Leslie Epps, NP at [phone]   After Hours: call [phone] and have the GYN Oncologist paged/contacted (after 5 pm  or on the weekends).   Messages sent via mychart are for non-urgent matters and are not responded to after hours so for urgent needs, please call the after hours number.

## 2023-11-23 NOTE — Patient Instructions (Addendum)
 SURGICAL WAITING ROOM VISITATION  Patients having surgery or a procedure may have no more than 2 support people in the waiting area - these visitors may rotate.    Children under the age of 100 must have an adult with them who is not the patient.  Visitors with respiratory illnesses are discouraged from visiting and should remain at home.  If the patient needs to stay at the hospital during part of their recovery, the visitor guidelines for inpatient rooms apply. Pre-op nurse will coordinate an appropriate time for 1 support person to accompany patient in pre-op.  This support person may not rotate.    Please refer to the [facility] website for the visitor guidelines for Inpatients (after your surgery is over and you are in a regular room).       Your procedure is scheduled on: 11/24/23   Report to [facility] Main Entrance    Report to admitting at 8:15 AM   Call this number if you have problems the morning of surgery [phone]   Do not eat food or drink liquid:After Midnight.but may have sips of water with meds.   After Midnight you may have the following liquids until __7:15____ AM DAY OF SURGERY  Water Non-Citrus Juices (without pulp, NO RED-Apple, White grape, White cranberry) Black Coffee (NO MILK/CREAM OR CREAMERS, sugar ok)  Clear Tea (NO MILK/CREAM OR CREAMERS, sugar ok) regular and decaf                             Plain Jell-O (NO RED)                                           Fruit ices (not with fruit pulp, NO RED)                                     Popsicles (NO RED)                                                               Sports drinks like Gatorade (NO RED)                 Oral Hygiene is also important to reduce your risk of infection.                                    Remember - BRUSH YOUR TEETH THE MORNING OF SURGERY WITH YOUR REGULAR TOOTHPASTE   Stop all vitamins and herbal supplements 7 days before surgery.   Take these medicines the  morning of surgery with A SIP OF WATER: Prozac , Levothyroxine , loratadine(claritin), tylenol  if needed.             You may not have any metal on your body including hair pins, jewelry, and body piercing             Do not wear make-up, lotions, powders, perfumes/cologne, or deodorant  Do not wear nail polish including gel and S&S, artificial/acrylic  nails, or any other type of covering on natural nails including finger and toenails. If you have artificial nails, gel coating, etc. that needs to be removed by a nail salon please have this removed prior to surgery or surgery may need to be canceled/ delayed if the surgeon/ anesthesia feels like they are unable to be safely monitored.   Do not shave  48 hours prior to surgery.    Do not bring valuables to the hospital. [facility] IS NOT             RESPONSIBLE   FOR VALUABLES.   Contacts, glasses, dentures or bridgework may not be worn into surgery.   .   DO NOT BRING YOUR HOME MEDICATIONS TO THE HOSPITAL. PHARMACY WILL DISPENSE MEDICATIONS LISTED ON YOUR MEDICATION LIST TO YOU DURING YOUR ADMISSION IN THE HOSPITAL!    Patients discharged on the day of surgery will not be allowed to drive home.  Someone NEEDS to stay with you for the first 24 hours after anesthesia.   Special Instructions: Bring a copy of your healthcare power of attorney and living will documents the day of surgery if you haven't scanned them before.              Please read over the following fact sheets you were given: IF YOU HAVE QUESTIONS ABOUT YOUR PRE-OP INSTRUCTIONS PLEASE CALL [phone] Leslie [name]   If you received a COVID test during your pre-op visit  it is requested that you wear a mask when out in public, stay away from anyone that may not be feeling well and notify your surgeon if you develop symptoms. If you test positive for Covid or have been in contact with anyone that has tested positive in the last 10 days please notify you surgeon.    [place] -  Preparing for Surgery Before surgery, you can play an important role.  Because skin is not sterile, your skin needs to be as free of germs as possible.  You can reduce the number of germs on your skin by washing with CHG (chlorahexidine gluconate) soap before surgery.  CHG is an antiseptic cleaner which kills germs and bonds with the skin to continue killing germs even after washing. Please DO NOT use if you have an allergy to CHG or antibacterial soaps.  If your skin becomes reddened/irritated stop using the CHG and inform your nurse when you arrive at Short Stay. Do not shave (including legs and underarms) for at least 48 hours prior to the first CHG shower.  You may shave your face/neck.  Please follow these instructions carefully:  1.  Shower with CHG Soap the night before surgery and the  morning of surgery.  2.  If you choose to wash your hair, wash your hair first as usual with your normal  shampoo.  3.  After you shampoo, rinse your hair and body thoroughly to remove the shampoo.                             4.  Use CHG as you would any other liquid soap.  You can apply chg directly to the skin and wash.  Gently with a scrungie or clean washcloth.  5.  Apply the CHG Soap to your body ONLY FROM THE NECK DOWN.   Do   not use on face/ open  Wound or open sores. Avoid contact with eyes, ears mouth and   genitals (private parts).                       Wash face,  Genitals (private parts) with your normal soap.             6.  Wash thoroughly, paying special attention to the area where your    surgery  will be performed.  7.  Thoroughly rinse your body with warm water from the neck down.  8.  DO NOT shower/wash with your normal soap after using and rinsing off the CHG Soap.                9.  Pat yourself dry with a clean towel.            10.  Wear clean pajamas.            11.  Place clean sheets on your bed the night of your first shower and do not  sleep with  pets. Day of Surgery : Do not apply any lotions/deodorants the morning of surgery.  Please wear clean clothes to the hospital/surgery center.  FAILURE TO FOLLOW THESE INSTRUCTIONS MAY RESULT IN THE CANCELLATION OF YOUR SURGERY  PATIENT SIGNATURE_________________________________  NURSE SIGNATURE__________________________________  ________________________________________________________________________

## 2023-11-23 NOTE — Telephone Encounter (Signed)
 Attempted to reach patient for pre-op call and remind patient of her missed pre-admission appointment. Left voicemail requesting call back to [phone].

## 2023-11-23 NOTE — Progress Notes (Addendum)
 COVID Vaccine received:  []  No [x]  Yes Date of any COVID positive Test in last 90 days: no PCP - Bernita Purchase PA Cardiologist - n/a  Chest x-ray -  EKG -   Stress Test -  ECHO -  Cardiac Cath -   Bowel Prep - [x]  No  []   Yes ______  Pacemaker / ICD device [x]  No []  Yes   Spinal Cord Stimulator:[x]  No []  Yes       History of Sleep Apnea? [x]  No []  Yes   CPAP used?- [x]  No []  Yes    Does the patient monitor blood sugar?          [x]  No []  Yes  []  N/A  Patient has: [x]  NO Hx DM   []  Pre-DM                 []  DM1  []   DM2 Does patient have a [employer] or Dexacom? []  No []  Yes   Fasting Blood Sugar Ranges-  Checks Blood Sugar _____ times a day  GLP1 agonist / usual dose - no GLP1 instructions:  SGLT-2 inhibitors / usual dose - no SGLT-2 instructions:   Blood Thinner / Instructions:no Aspirin  Instructions:no  Comments:   Activity level: Patient is able  to climb a flight of stairs without difficulty; [x]  No CP  [x]  No SOB,    Patient canperform ADLs without assistance.   Anesthesia review:   Patient denies shortness of breath, fever, cough and chest pain at PAT appointment.  Patient verbalized understanding and agreement to the Pre-Surgical Instructions that were given to them at this PAT appointment. Patient was also educated of the need to review these PAT instructions again prior to his/her surgery.I reviewed the appropriate phone numbers to call if they have any and questions or concerns.

## 2023-11-23 NOTE — Progress Notes (Signed)
 Called patient on phone to do PST interview. Pt. Verbalized her name and DOB to identify herself. Pt. Answered all questions. Pre op instructions given to pt. She verbalized understanding. Phone number to admitting given to pt. For her to call to do pre admit.

## 2023-11-24 ENCOUNTER — Encounter (HOSPITAL_COMMUNITY)
Admission: RE | Disposition: A | Discharge status: Home / Self Care | Payer: Self-pay | Source: Home / Self Care | Attending: Gynecologic Oncology

## 2023-11-24 ENCOUNTER — Ambulatory Visit (HOSPITAL_COMMUNITY): Admitting: Anesthesiology

## 2023-11-24 ENCOUNTER — Encounter (HOSPITAL_COMMUNITY): Payer: Self-pay | Admitting: Gynecologic Oncology

## 2023-11-24 ENCOUNTER — Other Ambulatory Visit: Payer: Self-pay

## 2023-11-24 ENCOUNTER — Ambulatory Visit (HOSPITAL_COMMUNITY)
Admission: RE | Admit: 2023-11-24 | Discharge: 2023-11-24 | Disposition: A | Discharge status: Home / Self Care | Attending: Gynecologic Oncology | Admitting: Gynecologic Oncology

## 2023-11-24 DIAGNOSIS — D071 Carcinoma in situ of vulva: Secondary | ICD-10-CM | POA: Diagnosis present

## 2023-11-24 DIAGNOSIS — E66813 Obesity, class 3: Secondary | ICD-10-CM | POA: Insufficient documentation

## 2023-11-24 DIAGNOSIS — M1990 Unspecified osteoarthritis, unspecified site: Secondary | ICD-10-CM | POA: Diagnosis not present

## 2023-11-24 DIAGNOSIS — E039 Hypothyroidism, unspecified: Secondary | ICD-10-CM | POA: Diagnosis not present

## 2023-11-24 DIAGNOSIS — F419 Anxiety disorder, unspecified: Secondary | ICD-10-CM | POA: Insufficient documentation

## 2023-11-24 DIAGNOSIS — L900 Lichen sclerosus et atrophicus: Secondary | ICD-10-CM | POA: Diagnosis not present

## 2023-11-24 DIAGNOSIS — Z6841 Body Mass Index (BMI) 40.0 and over, adult: Secondary | ICD-10-CM | POA: Diagnosis not present

## 2023-11-24 DIAGNOSIS — Z9884 Bariatric surgery status: Secondary | ICD-10-CM | POA: Insufficient documentation

## 2023-11-24 DIAGNOSIS — Z923 Personal history of irradiation: Secondary | ICD-10-CM | POA: Diagnosis not present

## 2023-11-24 DIAGNOSIS — C519 Malignant neoplasm of vulva, unspecified: Secondary | ICD-10-CM

## 2023-11-24 DIAGNOSIS — F32A Depression, unspecified: Secondary | ICD-10-CM | POA: Diagnosis not present

## 2023-11-24 DIAGNOSIS — Z87891 Personal history of nicotine dependence: Secondary | ICD-10-CM | POA: Diagnosis not present

## 2023-11-24 DIAGNOSIS — N903 Dysplasia of vulva, unspecified: Secondary | ICD-10-CM

## 2023-11-24 DIAGNOSIS — Z01818 Encounter for other preprocedural examination: Secondary | ICD-10-CM

## 2023-11-24 HISTORY — PX: EXAM UNDER ANESTHESIA, PELVIC: SHX7461

## 2023-11-24 HISTORY — PX: VULVA /PERINEUM BIOPSY: SHX319

## 2023-11-24 HISTORY — PX: LESION DESTRUCTION: SHX5132

## 2023-11-24 LAB — CBC
HCT: 37.7 % (ref 36.0–46.0)
Hemoglobin: 11.7 g/dL — ABNORMAL LOW (ref 12.0–15.0)
MCH: 25.9 pg — ABNORMAL LOW (ref 26.0–34.0)
MCHC: 31 g/dL (ref 30.0–36.0)
MCV: 83.6 fL (ref 80.0–100.0)
Platelets: 258 K/uL (ref 150–400)
RBC: 4.51 MIL/uL (ref 3.87–5.11)
RDW: 15.2 % (ref 11.5–15.5)
WBC: 5.7 K/uL (ref 4.0–10.5)
nRBC: 0 % (ref 0.0–0.2)

## 2023-11-24 SURGERY — EXAM UNDER ANESTHESIA, PELVIC
Anesthesia: General

## 2023-11-24 MED ORDER — FENTANYL CITRATE (PF) 250 MCG/5ML IJ SOLN
INTRAMUSCULAR | Status: DC | PRN
  Administered 2023-11-24: 25 ug via INTRAVENOUS
  Administered 2023-11-24 (×2): 50 ug via INTRAVENOUS
  Administered 2023-11-24: 25 ug via INTRAVENOUS

## 2023-11-24 MED ORDER — FENTANYL CITRATE (PF) 100 MCG/2ML IJ SOLN
INTRAMUSCULAR | Status: AC
Start: 2023-11-24 — End: 2023-11-24
  Filled 2023-11-24: qty 2

## 2023-11-24 MED ORDER — SILVER SULFADIAZINE 1 % EX CREA
TOPICAL_CREAM | CUTANEOUS | Status: DC | PRN
  Administered 2023-11-24: 1 via TOPICAL

## 2023-11-24 MED ORDER — LIDOCAINE 2% (20 MG/ML) 5 ML SYRINGE
INTRAMUSCULAR | Status: DC | PRN
  Administered 2023-11-24: 60 mg via INTRAVENOUS

## 2023-11-24 MED ORDER — DEXAMETHASONE SODIUM PHOSPHATE 10 MG/ML IJ SOLN: 4.0000 mg | INTRAMUSCULAR | Status: DC

## 2023-11-24 MED ORDER — TRAMADOL HCL 50 MG PO TABS: 50.0000 mg | ORAL_TABLET | Freq: Four times a day (QID) | ORAL | 0 refills | Status: DC | PRN

## 2023-11-24 MED ORDER — BUPIVACAINE LIPOSOME 1.3 % IJ SUSP
INTRAMUSCULAR | Status: AC
  Filled 2023-11-24: qty 20

## 2023-11-24 MED ORDER — LACTATED RINGERS IV SOLN: INTRAVENOUS | Status: DC

## 2023-11-24 MED ORDER — ACETIC ACID 5 % SOLN
Status: DC | PRN
  Administered 2023-11-24: 1 via TOPICAL

## 2023-11-24 MED ORDER — OXYCODONE HCL 5 MG PO TABS: 5.0000 mg | ORAL_TABLET | Freq: Once | ORAL | Status: DC | PRN

## 2023-11-24 MED ORDER — PROPOFOL 10 MG/ML IV BOLUS
INTRAVENOUS | Status: AC
  Filled 2023-11-24: qty 20

## 2023-11-24 MED ORDER — ONDANSETRON HCL 4 MG/2ML IJ SOLN
INTRAMUSCULAR | Status: DC | PRN
  Administered 2023-11-24: 4 mg via INTRAVENOUS

## 2023-11-24 MED ORDER — ACETAMINOPHEN 500 MG PO TABS
1000.0000 mg | ORAL_TABLET | Freq: Once | ORAL | Status: AC
  Administered 2023-11-24: 1000 mg via ORAL
  Filled 2023-11-24: qty 2

## 2023-11-24 MED ORDER — PROPOFOL 10 MG/ML IV BOLUS
INTRAVENOUS | Status: DC | PRN
  Administered 2023-11-24: 20 mg via INTRAVENOUS
  Administered 2023-11-24: 170 mg via INTRAVENOUS
  Administered 2023-11-24 (×2): 30 mg via INTRAVENOUS

## 2023-11-24 MED ORDER — BUPIVACAINE HCL (PF) 0.25 % IJ SOLN
INTRAMUSCULAR | Status: DC | PRN
  Administered 2023-11-24: 10 mL

## 2023-11-24 MED ORDER — MIDAZOLAM HCL 2 MG/2ML IJ SOLN
INTRAMUSCULAR | Status: DC | PRN
  Administered 2023-11-24: 2 mg via INTRAVENOUS

## 2023-11-24 MED ORDER — LIDOCAINE HCL 1 % IJ SOLN
INTRAMUSCULAR | Status: AC
  Filled 2023-11-24: qty 20

## 2023-11-24 MED ORDER — SILVER SULFADIAZINE 1 % EX CREA
TOPICAL_CREAM | Freq: Once | CUTANEOUS | Status: DC
  Filled 2023-11-24: qty 50

## 2023-11-24 MED ORDER — OXYCODONE HCL 5 MG/5ML PO SOLN: 5.0000 mg | Freq: Once | ORAL | Status: DC | PRN

## 2023-11-24 MED ORDER — ACETAMINOPHEN 500 MG PO TABS: 1000.0000 mg | ORAL_TABLET | ORAL | Status: DC

## 2023-11-24 MED ORDER — DROPERIDOL 2.5 MG/ML IJ SOLN: 0.6250 mg | Freq: Once | INTRAMUSCULAR | Status: DC | PRN

## 2023-11-24 MED ORDER — BUPIVACAINE LIPOSOME 1.3 % IJ SUSP
INTRAMUSCULAR | Status: DC | PRN
  Administered 2023-11-24: 10 mL

## 2023-11-24 MED ORDER — MIDAZOLAM HCL 2 MG/2ML IJ SOLN
INTRAMUSCULAR | Status: AC
  Filled 2023-11-24: qty 2

## 2023-11-24 MED ORDER — CHLORHEXIDINE GLUCONATE 0.12 % MT SOLN
15.0000 mL | Freq: Once | OROMUCOSAL | Status: AC
  Administered 2023-11-24: 15 mL via OROMUCOSAL

## 2023-11-24 MED ORDER — DEXAMETHASONE SODIUM PHOSPHATE 10 MG/ML IJ SOLN
INTRAMUSCULAR | Status: DC | PRN
  Administered 2023-11-24: 10 mg via INTRAVENOUS

## 2023-11-24 MED ORDER — FENTANYL CITRATE PF 50 MCG/ML IJ SOSY: 25.0000 ug | PREFILLED_SYRINGE | INTRAMUSCULAR | Status: DC | PRN

## 2023-11-24 MED ORDER — BUPIVACAINE HCL (PF) 0.25 % IJ SOLN
INTRAMUSCULAR | Status: AC
Start: 2023-11-24 — End: 2023-11-24
  Filled 2023-11-24: qty 30

## 2023-11-24 MED ORDER — ORAL CARE MOUTH RINSE: 15.0000 mL | Freq: Once | OROMUCOSAL | Status: AC

## 2023-11-24 SURGICAL SUPPLY — 28 items
BLADE SURG 15 STRL LF DISP TIS (BLADE) IMPLANT
DEPRESSOR TONGUE 6 IN STERILE (GAUZE/BANDAGES/DRESSINGS) ×2 IMPLANT
DRAPE UNDERBUTTOCKS STRL (DISPOSABLE) IMPLANT
DRSG TELFA 3X8 NADH STRL (GAUZE/BANDAGES/DRESSINGS) IMPLANT
ELECT BALL LEEP 3MM BLK (ELECTRODE) IMPLANT
GLOVE BIO SURGEON STRL SZ 6 (GLOVE) ×4 IMPLANT
GOWN STRL REUS W/ TWL LRG LVL3 (GOWN DISPOSABLE) ×2 IMPLANT
KIT TURNOVER KIT A (KITS) ×2 IMPLANT
NDL HYPO 22X1.5 SAFETY MO (MISCELLANEOUS) IMPLANT
NDL SPNL 22GX7 QUINCKE BK (NEEDLE) IMPLANT
NEEDLE HYPO 22X1.5 SAFETY MO (MISCELLANEOUS) ×1 IMPLANT
NEEDLE SPNL 22GX7 QUINCKE BK (NEEDLE) IMPLANT
PACK LITHOTOMY IV (CUSTOM PROCEDURE TRAY) IMPLANT
PAD PREP 24X48 CUFFED NSTRL (MISCELLANEOUS) ×2 IMPLANT
PUNCH BIOPSY 3 (MISCELLANEOUS) IMPLANT
SCOPETTES 8 STERILE (MISCELLANEOUS) ×2 IMPLANT
SOL PREP POV-IOD 4OZ 10% (MISCELLANEOUS) ×2 IMPLANT
SUT VIC AB 0 CT1 36 (SUTURE) IMPLANT
SUT VIC AB 2-0 SH 27XBRD (SUTURE) IMPLANT
SUT VIC AB 3-0 PS2 18XBRD (SUTURE) IMPLANT
SUT VIC AB 3-0 SH 27X BRD (SUTURE) IMPLANT
SUT VIC AB 4-0 PS2 18 (SUTURE) IMPLANT
SUT VICRYL 0 UR6 27IN ABS (SUTURE) IMPLANT
SYR CONTROL 10ML LL (SYRINGE) ×2 IMPLANT
TOWEL OR 17X26 10 PK STRL BLUE (TOWEL DISPOSABLE) IMPLANT
TUBING CONNECTING 10 (TUBING) ×2 IMPLANT
VACUUM HOSE 7/8X10 W/ WAND (MISCELLANEOUS) IMPLANT
WATER STERILE IRR 500ML POUR (IV SOLUTION) ×2 IMPLANT

## 2023-11-24 NOTE — Anesthesia Procedure Notes (Signed)
 Procedure Name: LMA Insertion Date/Time: 11/24/2023 11:40 AM  Performed by: Cena Epps, CRNAPre-anesthesia Checklist: Patient identified, Emergency Drugs available, Suction available, Timeout performed and Patient being monitored Patient Re-evaluated:Patient Re-evaluated prior to induction Oxygen Delivery Method: Circle system utilized Preoxygenation: Pre-oxygenation with 100% oxygen Induction Type: IV induction LMA: LMA with gastric port inserted LMA Size: 4.0 Number of attempts: 1 Placement Confirmation: breath sounds checked- equal and bilateral Tube secured with: Tape Dental Injury: Teeth and Oropharynx as per pre-operative assessment

## 2023-11-24 NOTE — Interval H&P Note (Signed)
 History and Physical Interval Note:  11/24/2023 10:46 AM  Leslie [name]  has presented today for surgery, with the diagnosis of VULVAR DYSPLASIA,HISTORY OF VULVAR CANCER.  The various methods of treatment have been discussed with the patient and family. After consideration of risks, benefits and other options for treatment, the patient has consented to  Procedure(s): EXAM UNDER ANESTHESIA, PELVIC (N/A) BIOPSY, VULVA (N/A) DESTRUCTION, LESION, GENITALIA (N/A) as a surgical intervention.  The patient's history has been reviewed, patient examined, no change in status, stable for surgery.  I have reviewed the patient's chart and labs.  Questions were answered to the patient's satisfaction.     Comer JONELLE Dollar

## 2023-11-24 NOTE — Anesthesia Postprocedure Evaluation (Signed)
 Anesthesia Post Note  Patient: Leslie [name]  Procedure(s) Performed: EXAM UNDER ANESTHESIA, PELVIC BIOPSY, VULVA DESTRUCTION, LESION, GENITALIA     Patient location during evaluation: PACU Anesthesia Type: General Level of consciousness: awake and alert Pain management: pain level controlled Vital Signs Assessment: post-procedure vital signs reviewed and stable Respiratory status: spontaneous breathing, nonlabored ventilation and respiratory function stable Cardiovascular status: blood pressure returned to baseline Postop Assessment: no apparent nausea or vomiting Anesthetic complications: no   No notable events documented.  Last Vitals:  Vitals:   11/24/23 1315 11/24/23 1330  BP: 114/67 125/89  Pulse: (!) 54 63  Resp: 11 12  Temp:  (!) 36.4 C  SpO2: 92% 96%    Last Pain:  Vitals:   11/24/23 1330  TempSrc:   PainSc: 0-No pain                 Vertell Row

## 2023-11-24 NOTE — Transfer of Care (Signed)
 Immediate Anesthesia Transfer of Care Note  Patient: Leslie [name]  Procedure(s) Performed: EXAM UNDER ANESTHESIA, PELVIC BIOPSY, VULVA DESTRUCTION, LESION, GENITALIA  Patient Location: PACU  Anesthesia Type:General  Level of Consciousness: awake, alert , and oriented  Airway & Oxygen Therapy: Patient Spontanous Breathing and Patient connected to face mask oxygen  Post-op Assessment: Report given to RN and Post -op Vital signs reviewed and stable  Post vital signs: Reviewed and stable  Last Vitals:  Vitals Value Taken Time  BP 137/73 11/24/23 12:16  Temp 36.1 C 11/24/23 12:15  Pulse 63 11/24/23 12:20  Resp 11 11/24/23 12:20  SpO2 100 % 11/24/23 12:20  Vitals shown include unfiled device data.  Last Pain:  Vitals:   11/24/23 1215  TempSrc:   PainSc: Asleep         Complications: No notable events documented.

## 2023-11-24 NOTE — Op Note (Signed)
 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 After application of acetic acid , some acetowhite along right mid vulva with two areas with mildly raised lesions (< 5 mm) with punctations - both biopsied. No other acetowhite changes noted.  Procedure: The patient was identified in the preoperative holding area. Informed consent was signed on the chart. Patient was seen history was reviewed and exam was performed.   The patient was then taken to the operating room and placed in the supine position with SCD hose on. General anesthesia was then induced without difficulty. She was then placed in the dorsolithotomy position. The perineum was prepped with CHG. The vagina was prepped with Betadine . The patient was then draped after the prep was dried.   Timeout was performed the patient, procedure, antibiotic, allergy, and length of procedure. 5% acetic acid  solution was applied to the perineum. The vulvar tissues were inspected for areas of acetowhite changes or leukoplakia. The area was identified and appropriate surgical margins were marked. The subcuticular tissues were infiltrated with 0.25% marcaine . Two vulvar biopsies were taken with Tischler forceps.  The patient's surgical field was draped with wet towels. The staff and patient ensured laser-safe eyewear and masks  were fitted. The laser was set to 8 watts continuous. The laser was tested for accuracy on a tongue depressor.  The laser was applied to the circumscribed area of the vulva that had been previously identified. The tissue was ablated to the desired depth and the eschar was removed with a moistened sponge. When the entire lesion had been ablated the procedure was complete. Exparel  was injected for local anesthesia. The vulva was irrigated.  Silvadine cream was applied to the laser site.  All instrument, suture, laparotomy, Ray-Tec, and needle counts were correct x2. The patient tolerated the procedure well and was taken recovery room in stable condition.   Crank Viktoria MD Gynecologic Oncology

## 2023-11-25 ENCOUNTER — Telehealth: Payer: Self-pay | Admitting: *Deleted

## 2023-11-25 ENCOUNTER — Encounter (HOSPITAL_COMMUNITY): Payer: Self-pay | Admitting: Gynecologic Oncology

## 2023-11-25 LAB — SURGICAL PATHOLOGY

## 2023-11-25 NOTE — Telephone Encounter (Signed)
 Spoke with Leslie [name] this morning. She states she is eating, drinking and urinating well. She has not had a BM yet but is passing gas. Encouraged her to drink plenty of water. She denies fever or chills. 0 She rates her pain 0/10.  Instructed to call office with any fever, chills, purulent drainage, uncontrolled pain or any other questions or concerns. Patient verbalizes understanding.   Pt aware of post op appointments as well as the office number [phone] and after hours number [phone] to call if she has any questions or concerns

## 2023-11-29 ENCOUNTER — Ambulatory Visit: Payer: Self-pay | Admitting: Gynecologic Oncology

## 2023-11-29 NOTE — Addendum Note (Signed)
 Encounter addended by: Wyatt Leeroy HERO, PA-C on: 11/29/2023 1:54 PM  Actions taken: Clinical Note Signed

## 2023-11-30 ENCOUNTER — Encounter: Payer: Self-pay | Admitting: Gynecologic Oncology

## 2023-12-02 ENCOUNTER — Encounter: Payer: Self-pay | Admitting: Radiation Oncology

## 2023-12-05 ENCOUNTER — Ambulatory Visit
Admission: RE | Admit: 2023-12-05 | Discharge: 2023-12-05 | Disposition: A | Discharge status: Home / Self Care | Source: Ambulatory Visit | Attending: Radiation Oncology | Admitting: Radiation Oncology

## 2023-12-05 ENCOUNTER — Encounter: Payer: Self-pay | Admitting: Radiation Oncology

## 2023-12-05 DIAGNOSIS — Z79899 Other long term (current) drug therapy: Secondary | ICD-10-CM | POA: Insufficient documentation

## 2023-12-05 DIAGNOSIS — Z923 Personal history of irradiation: Secondary | ICD-10-CM | POA: Diagnosis not present

## 2023-12-05 DIAGNOSIS — Z79890 Hormone replacement therapy (postmenopausal): Secondary | ICD-10-CM | POA: Diagnosis not present

## 2023-12-05 DIAGNOSIS — C519 Malignant neoplasm of vulva, unspecified: Secondary | ICD-10-CM

## 2023-12-05 DIAGNOSIS — C518 Malignant neoplasm of overlapping sites of vulva: Secondary | ICD-10-CM | POA: Insufficient documentation

## 2023-12-05 HISTORY — DX: Personal history of irradiation: Z92.3

## 2023-12-05 NOTE — Progress Notes (Signed)
 Leslie [name] is here today for follow up post radiation to the pelvic.  They completed their radiation on: 11/01/23   Does the patient complain of any of the following:  Pain:No Abdominal bloating: yes Diarrhea/Constipation: Yes, diarrhea. Patient taking Imodium.  Nausea/Vomiting: No Vaginal Discharge: No Blood in Urine or Stool: No Urinary Issues (dysuria/incomplete emptying/ incontinence/ increased frequency/urgency): Yes, urinary frequency.  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  BP (P) 131/60 (BP Location: Right Wrist, Patient Position: Sitting)   Pulse (P) 64   Temp (!) (P) 97.3 F (36.3 C) (Temporal)   Resp (P) 18   Ht (P) 5' 2 (1.575 m)   Wt (P) 244 lb (110.7 kg)   SpO2 98%   BMI (P) 44.63 kg/m

## 2023-12-05 NOTE — Progress Notes (Signed)
 Radiation Oncology         [phone] ________________________________  Name: Leslie [name] MRN: [id number]  Date: 12/05/2023  DOB: [birth year]  Follow-Up Visit Note  CC: Juliane Che, PA  Juliane Che, PA    ICD-10-CM   1. Malignant neoplasm of overlapping sites of vulva (HCC)  C51.8       Diagnosis: The primary encounter diagnosis was Malignant neoplasm of overlapping sites of vulva (HCC). A diagnosis of Vulva cancer (HCC) was also pertinent to this visit.   Stage IIIB Vulvar Carcinoma; s/p partial modified radical vulvectomy and right inguinal node dissection with 1/4 lymph nodes positive for metastatic carcinoma, measuring 5 mm in greatest dimension  Interval Since Last Radiation: 1 month and 3 days   Intent: Curative  Radiation Treatment Dates: First Treatment Date: 2023-09-21 -- Last Treatment Date: 2023-11-01 Site/Dose/Technique/Mode:  Plan Name: Pelvis_R Site: Groin, Right Technique: IMRT Mode: Photon Dose Per Fraction: 1.8 Gy Prescribed Dose (Delivered / Prescribed): 50.4 Gy / 50.4 Gy Prescribed Fxs (Delivered / Prescribed): 28 / 28  Narrative:  The patient returns today for routine follow-up. She tolerated radiation therapy relatively well other than fatigue, anticipated skin changes in the treatment field, some bladder urgency, and diarrhea which was managed with imodium.     Since her initial consultation date of 08/17/23 (prior to starting XRT), she followed up with Dr. Lewie office on several occasions in June for management of candidiasis infection to the bilateral groins, inner upper thighs, and vulva. Management consisted of Diflucan . She also presented to Dr. Viktoria on 09/13/23 for management of seeping from her groin incisions (which had reopened). Wound care was administered and she was prescribed a course of Keflex .    Of note: Given her skin issues, her radiation therapy start date was delayed by 1 week to allow for adequate healing.   After  completing radiation therapy, she presented to her gynecologist on 11/03/23 with c/o vulvar itching. Pelvic exam performed at that time noted a 2 mm white colored lesion on the inner right vulva. The lesion was biopsied and sent for pathology. Results showed low-grade dysplasia with a focal area concerning for inflammation versus high-grade dysplasia (VIN-1).   Given her symptoms and indeterminate pathology, Dr. Viktoria advised proceeding with additional biopsies and laser ablation of the more concerning appearing areas.      She was accordingly admitted for vulvar biopsies with laser ablations under anesthesia on 11/24/23. 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 The 10 o'clock biopsy also showed a few cells suspicious for microinvasion along with HGSIL present at the excisional edge.    Given her recent biopsy results, Dr. Viktoria would like to follow her a bit more closely.       Patient reports to be doing well overall. She notes that her skin irritation has improved significantly. She also notes residual diarrhea and urinary urgency that has also improved. She is still taking immodium for her loose stools.                       Allergies:  has no known allergies.  Meds: Current Outpatient Medications  Medication Sig Dispense Refill   acetaminophen  (TYLENOL ) 500 MG tablet Take 500 mg by mouth every 6 (six) hours as needed for moderate pain (pain score 4-6).  BIOTIN PO Take 6,000 mcg by mouth daily.     Calcium  Carb-Cholecalciferol (CALCIUM  500 + D PO) Take 2 tablets by mouth daily.     Cholecalciferol 50 MCG (2000 UT) TABS Take 4,000 Units by mouth daily.     clobetasol  ointment (TEMOVATE ) 0.05 % Apply to affected area every night for 4 weeks, then every other day for 4 weeks and then twice a week  for 4 weeks or until resolution. (Patient taking differently: Apply 1 Application topically daily.) 30 g 5   Ferrous Sulfate (IRON PO) Take 26 mg by mouth daily.     FLUoxetine  (PROZAC ) 40 MG capsule Take 40 mg by mouth at bedtime.     hydrOXYzine  (ATARAX ) 25 MG tablet TAKE 1 TABLET(25 MG) BY MOUTH AT BEDTIME AS NEEDED FOR ITCHING 30 tablet 2   levothyroxine  (SYNTHROID ) 137 MCG tablet Take 137 mcg by mouth daily before breakfast.     loratadine (CLARITIN) 10 MG tablet Take 10 mg by mouth daily as needed for allergies.     mineral oil-hydrophilic petrolatum  (AQUAPHOR) ointment Apply topically as needed for dry skin. (Patient not taking: Reported on 11/03/2023) 420 g 0   Multiple Vitamin (MULTIVITAMIN) capsule Take 2 capsules by mouth daily.     nystatin  (MYCOSTATIN /NYSTOP ) powder Apply 1 Application topically 3 (three) times daily. 15 g 1   traMADol  (ULTRAM ) 50 MG tablet Take 1 tablet (50 mg total) by mouth every 6 (six) hours as needed for severe pain (pain score 7-10). For AFTER surgery only, do not take and drive 10 tablet 0   No current facility-administered medications for this encounter.    Physical Findings: The patient is in no acute distress. Patient is alert and oriented.  height is 5' 2 (1.575 m) (pended) and weight is 244 lb (110.7 kg) (pended). Her temporal temperature is 97.3 F (36.3 C) (abnormal, pended). Her blood pressure is 131/60 (pended) and her pulse is 64 (pended). Her respiration is 18 (pended) and oxygen saturation is 98%. .  No significant changes. Lungs are clear to auscultation bilaterally. Heart has regular rate and rhythm. No palpable cervical, supraclavicular, or axillary adenopathy. Abdomen soft, non-tender, normal bowel sounds.  Mild erythema within inguinal folds. No concerning lesions.   Lab Findings: Lab Results  Component Value Date   WBC 5.7 11/24/2023   HGB 11.7 (L) 11/24/2023   HCT 37.7 11/24/2023   MCV 83.6 11/24/2023   PLT 258 11/24/2023     Radiographic Findings: No results found.  Impression: Stage IIIB Vulvar Carcinoma; s/p partial modified radical vulvectomy and right inguinal node dissection with 1/4 lymph nodes positive for metastatic carcinoma, measuring 5 mm in greatest dimension  The patient is recovering from the effects of radiation. Recommend continued use of Imodium until diarrhea has resolved. She is pleased with her overall response to the radiation treatment.   Patient will continue close follow-up under the care of Dr. Viktoria. She is scheduled to see her next in October. Radiation follow-up PRN. We appreciate the opportunity to take part in this patient's care.  She was encouraged to call with any questions or concerns.   20 minutes of total time was spent for this patient encounter, including preparation, face-to-face counseling with the patient and coordination of care, physical exam, and documentation of the encounter. ____________________________________   Leeroy Due, PA-C   This document serves as a record of services personally performed by Leeroy Due, PA-C. It was created on her behalf by Dorthy Fuse, a trained medical scribe.  The creation of this record is based on the scribe's personal observations and the provider's statements to them. This document has been checked and approved by the attending provider.

## 2024-01-13 ENCOUNTER — Encounter: Payer: Self-pay | Admitting: Gynecologic Oncology

## 2024-01-13 ENCOUNTER — Inpatient Hospital Stay: Attending: Gynecologic Oncology | Admitting: Gynecologic Oncology

## 2024-01-13 VITALS — BP 125/67 | HR 66 | Temp 98.7°F | Resp 20 | Wt 245.2 lb

## 2024-01-13 DIAGNOSIS — C519 Malignant neoplasm of vulva, unspecified: Secondary | ICD-10-CM

## 2024-01-13 DIAGNOSIS — N903 Dysplasia of vulva, unspecified: Secondary | ICD-10-CM

## 2024-01-13 DIAGNOSIS — Z7189 Other specified counseling: Secondary | ICD-10-CM

## 2024-01-13 NOTE — Patient Instructions (Signed)
 It was good to see you today.  Your tissue is healing very well after the laser procedure.  You can start using the clobetasol  cream again for itching.  Please call the office if you feel a new area or have a change in your symptoms.  We will get your PET scan scheduled for mid-to-late November.  We like to wait at least 3 months after you finish radiation because there can be continued effects from the radiation.  Will see you back for follow-up in 3 months.

## 2024-01-13 NOTE — Progress Notes (Unsigned)
 Gynecologic Oncology Return Clinic Visit  01/13/24  Reason for Visit: follow-up  Treatment History: Oncology History Overview Note  Presented with vulvar pruritus   Vulvar cancer (HCC)  04/13/2023 Surgery   EUA, vulvar biopsy, pap test FINDINGS:  Erythema in groin folds and along inner labia majora, atrophic vaginal introitus with atrophic urethral meatus, normal appearing cervix, thickened and friable clitoral hood, 1.5cm ulcerated lesion on upper inner right labia  SPECIMENS: left labial biopsy, right labial biopsy, clitoral hood biopsy, cervical pap smear    04/13/2023 Initial Biopsy   A. LABIA, RIGHT INNER, BIOPSY:  - Invasive moderately differentiated squamous cell carcinoma, see  comment  B. CLITORAL, BIOPSY:  - Invasive moderately differentiated squamous cell carcinoma, see  comment  C. 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  Moderate hypermetabolism in the anal region, SUV max 7.7.  No pelvic adenopathy.  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  Comment that there are swirling vessels in the root of the small bowel mesentery likely due to loose mesenteric attachment   09/22/2023 - 11/01/2023 Radiation Therapy   IMRT to right inguinal lymph nodes,     Developed vulvar symptoms. Biopsy on 11/03/23 showed squamous atypia consistent with low-grade dysplasia with a focus of marked inflammation and possible erosion, high-grade dysplasia cannot be ruled out, no invasive carcinoma.  11/24/2023: Vulvar biopsies, CO2 laser ablation of the vulva.  Interval History: Overall doing well.  Having some vulvar itching intermittently.  Much less sore.  Denies any significant discharge or bleeding.  Continues to struggle with diarrhea, using  Imodium twice a day, sometimes takes an extra dose.  Past Medical/Surgical History: Past Medical History:  Diagnosis Date   Anal squamous cell carcinoma (HCC)    Anxiety    Arthritis    Depression    History of adenomatous polyp of colon 2015   History of hiatal hernia    History of radiation therapy    Right Pelvis-09/21/23-11/01/23- Dr. Lynwood Nasuti   Hypothyroidism    IDA (iron deficiency anemia)    hematolody-- dr a. 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   Vulva cancer [facility])     Past Surgical History:  Procedure Laterality Date   BREAST REDUCTION SURGERY  2001   COLONOSCOPY WITH PROPOFOL   06/04/2013   dr avram   DILATION AND CURETTAGE OF UTERUS     yrs ago   EXAM UNDER ANESTHESIA, PELVIC N/A 11/24/2023   Procedure: EXAM UNDER ANESTHESIA, PELVIC;  Surgeon: Viktoria Comer SAUNDERS, MD;  Location: [facility];  Service: Gynecology;  Laterality: N/A;   INGUINAL LYMPHADENECTOMY Right 08/03/2023   Procedure: LYMPHADENECTOMY, INGUINAL, OPEN; VULVA BIOPSY;  Surgeon: Viktoria Comer SAUNDERS, MD;  Location: [facility];  Service: Gynecology;  Laterality: Right;   LAPAROSCOPIC GASTRIC BANDING WITH HIATAL HERNIA REPAIR  03/28/2006   @ [facility] by dr b. mikell   LAPAROSCOPIC ROUX-EN-Y GASTRIC BYPASS WITH UPPER ENDOSCOPY AND REMOVAL OF LAP BAND  02/26/2019   @ [facility] by dr j. dasher   LESION DESTRUCTION N/A 11/24/2023   Procedure: DESTRUCTION, LESION, GENITALIA;  Surgeon: Viktoria Comer SAUNDERS, MD;  Location: [facility];  Service: Gynecology;  Laterality: N/A;   LESION REMOVAL N/A 12/30/2022   Procedure: EXCISION BIOPSY PERIANAL LESION;  Surgeon: Debby Hila, MD;  Location: [facility] ;  Service: General;  Laterality: N/A;   VULVA MARYBETH BIOPSY N/A 04/13/2023   Procedure: VULVAR BIOPSY WITH PAPSMERE;  Surgeon: Jeralyn Crutch, MD;   Location: [facility];  Service: Gynecology;  Laterality: N/A;   VULVA /PERINEUM BIOPSY N/A 11/24/2023   Procedure: BIOPSY, VULVA;  Surgeon: Viktoria Comer SAUNDERS, MD;  Location: [facility];  Service: Gynecology;  Laterality: N/A;   VULVECTOMY N/A 05/09/2023   Procedure: PARTIAL MODIFIED RADICAL VULVECTOMY;  Surgeon: Viktoria Comer SAUNDERS, MD;  Location: [facility];  Service: Gynecology;  Laterality: N/A;   WISDOM TOOTH EXTRACTION      Family History  Problem Relation Age of Onset   Heart attack Mother    Breast cancer Mother    Heart attack Father    Breast cancer Maternal Grandmother        colon cancer   Colon cancer Maternal Grandmother    Diabetes Neg Hx    Hyperlipidemia Neg Hx    Hypertension Neg Hx    Sudden death Neg Hx    Prostate cancer Neg Hx    Pancreatic cancer Neg Hx    Ovarian cancer Neg Hx    Endometrial cancer Neg Hx     Social History   Socioeconomic History   Marital status: Single    Spouse name: Not on file   Number of children: Not on file   Years of education: Not on file   Highest education level: Not on file  Occupational History   Not on file  Tobacco Use   Smoking status: Former    Types: Cigarettes   Smokeless tobacco: Never   Tobacco comments:    04-12-2023  quit smoking 2000,  started age 60  Vaping Use   Vaping status: Never Used  Substance and Sexual Activity   Alcohol use: Not Currently    Comment: rare   Drug use: Never   Sexual activity: Not on file  Other Topics Concern   Not on file  Social History Narrative   Not on file   Social Drivers of Health   Financial Resource Strain: Low Risk  (08/29/2023)   [facility]   Overall Financial Resource Strain (CARDIA)    Difficulty of Paying Living Expenses: Not hard at all  Food Insecurity: No Food Insecurity (11/03/2023)   Hunger Vital Sign    Worried About Running Out of Food in the Last Year: Never true    Ran Out of Food in the Last Year: Never true   Transportation Needs: No Transportation Needs (08/29/2023)   Received from [facility] - Transportation    Lack of Transportation (Medical): No    Lack of Transportation (Non-Medical): No  Physical Activity: Unknown (08/29/2023)   Received from [facility]   Exercise Vital Sign    On average, how many days per week do you engage in moderate to strenuous exercise (like a brisk walk)?: 0 days    Minutes of Exercise per Session: Not on file  Stress: No Stress Concern Present (08/29/2023)   Received from [facility] - Occupational Stress Questionnaire    Feeling of Stress : Only a little  Social Connections: Moderately Integrated (08/29/2023)   Received from [facility]   Social Network    How would  you rate your social network (family, work, friends)?: Adequate participation with social networks    Current Medications:  Current Outpatient Medications:    acetaminophen  (TYLENOL ) 500 MG tablet, Take 500 mg by mouth every 6 (six) hours as needed for moderate pain (pain score 4-6)., Disp: , Rfl:    BIOTIN PO, Take 6,000 mcg by mouth daily., Disp: , Rfl:    Calcium  Carb-Cholecalciferol (CALCIUM  500 + D PO), Take 2 tablets by mouth daily., Disp: , Rfl:    Cholecalciferol 50 MCG (2000 UT) TABS, Take 4,000 Units by mouth daily., Disp: , Rfl:    clobetasol  ointment (TEMOVATE ) 0.05 %, Apply to affected area every night for 4 weeks, then every other day for 4 weeks and then twice a week for 4 weeks or until resolution. (Patient not taking: Reported on 01/10/2024), Disp: 30 g, Rfl: 5   Ferrous Sulfate (IRON PO), Take 26 mg by mouth daily., Disp: , Rfl:    FLUoxetine  (PROZAC ) 40 MG capsule, Take 40 mg by mouth at bedtime., Disp: , Rfl:    hydrOXYzine  (ATARAX ) 25 MG tablet, TAKE 1 TABLET(25 MG) BY MOUTH AT BEDTIME AS NEEDED FOR ITCHING, Disp: 30 tablet, Rfl: 2   levothyroxine  (SYNTHROID ) 137 MCG tablet, Take 137 mcg by mouth daily before  breakfast., Disp: , Rfl:    loratadine (CLARITIN) 10 MG tablet, Take 10 mg by mouth daily as needed for allergies., Disp: , Rfl:    mineral oil-hydrophilic petrolatum  (AQUAPHOR) ointment, Apply topically as needed for dry skin. (Patient not taking: Reported on 11/03/2023), Disp: 420 g, Rfl: 0   Multiple Vitamin (MULTIVITAMIN) capsule, Take 2 capsules by mouth daily., Disp: , Rfl:    nystatin  (MYCOSTATIN /NYSTOP ) powder, Apply 1 Application topically 3 (three) times daily., Disp: 15 g, Rfl: 1   traMADol  (ULTRAM ) 50 MG tablet, Take 1 tablet (50 mg total) by mouth every 6 (six) hours as needed for severe pain (pain score 7-10). For AFTER surgery only, do not take and drive (Patient not taking: Reported on 01/10/2024), Disp: 10 tablet, Rfl: 0  Review of Systems: Denies appetite changes, fevers, chills, fatigue, unexplained weight changes. Denies hearing loss, neck lumps or masses, mouth sores, ringing in ears or voice changes. Denies cough or wheezing.  Denies shortness of breath. Denies chest pain or palpitations. Denies leg swelling. Denies abdominal distention, pain, blood in stools, constipation, nausea, vomiting, or early satiety. Denies pain with intercourse, dysuria, frequency, hematuria or incontinence. Denies hot flashes, pelvic pain, vaginal bleeding or vaginal discharge.   Denies joint pain, back pain or muscle pain/cramps. Denies rash, or wounds. Denies dizziness, headaches, numbness or seizures. Denies swollen lymph nodes or glands, denies easy bruising or bleeding. Denies anxiety, depression, confusion, or decreased concentration.  Physical Exam: BP 125/67 (BP Location: Right Arm, Patient Position: Sitting)   Pulse 66   Temp 98.7 F (37.1 C) (Oral)   Resp 20   Wt 245 lb 3.2 oz (111.2 kg)   SpO2 98%   BMI (P) 44.85 kg/m  General: Alert, oriented, no acute distress. HEENT: Posterior oropharynx clear, sclera anicteric. Chest: Unlabored breathing on room air.  Lymphatics: ***No  cervical, supraclavicular, or inguinal adenopathy. GU: Normal appearing external genitalia without erythema, excoriation, or lesions.  Speculum exam reveals ***.  Bimanual exam reveals ***.  ***Rectovaginal exam  confirms ___.  Laboratory & Radiologic Studies: ***  Assessment & Plan: Melyna Huron is a 68 y.o. woman with Stage *** who presents for ***.  ***  *** minutes of total time  was spent for this patient encounter, including preparation, face-to-face counseling with the patient and coordination of care, and documentation of the encounter.  Comer Dollar, MD  Division of Gynecologic Oncology  Department of Obstetrics and Gynecology  [facility]

## 2024-01-20 ENCOUNTER — Other Ambulatory Visit: Payer: Self-pay | Admitting: Obstetrics and Gynecology

## 2024-01-20 DIAGNOSIS — L292 Pruritus vulvae: Secondary | ICD-10-CM

## 2024-02-02 ENCOUNTER — Ambulatory Visit: Admitting: Gynecologic Oncology

## 2024-02-10 ENCOUNTER — Ambulatory Visit: Admitting: Gynecologic Oncology

## 2024-03-02 ENCOUNTER — Encounter (HOSPITAL_COMMUNITY): Payer: Self-pay

## 2024-03-02 ENCOUNTER — Encounter (HOSPITAL_COMMUNITY)
Admission: RE | Admit: 2024-03-02 | Discharge: 2024-03-02 | Disposition: A | Discharge status: Home / Self Care | Source: Ambulatory Visit | Attending: Gynecologic Oncology | Admitting: Gynecologic Oncology

## 2024-03-02 DIAGNOSIS — C519 Malignant neoplasm of vulva, unspecified: Secondary | ICD-10-CM

## 2024-03-19 ENCOUNTER — Ambulatory Visit (HOSPITAL_COMMUNITY)
Admission: RE | Admit: 2024-03-19 | Discharge: 2024-03-19 | Disposition: A | Discharge status: Home / Self Care | Source: Ambulatory Visit | Attending: Gynecologic Oncology | Admitting: Gynecologic Oncology

## 2024-03-19 DIAGNOSIS — C519 Malignant neoplasm of vulva, unspecified: Secondary | ICD-10-CM | POA: Diagnosis present

## 2024-03-19 LAB — GLUCOSE, CAPILLARY: Glucose-Capillary: 96 mg/dL (ref 70–99)

## 2024-03-19 MED ORDER — FLUDEOXYGLUCOSE F - 18 (FDG) INJECTION
12.0000 | Freq: Once | INTRAVENOUS | Status: AC
  Administered 2024-03-19: 12.33 via INTRAVENOUS

## 2024-03-26 NOTE — Progress Notes (Signed)
" ° °  PROVIDER:  ALICIA CHRISTINE THOMAS, MD  MRN: [id number] DOB: 10-29-1955 DATE OF ENCOUNTER: 03/26/2024 Interval History:    69 year old female who underwent excisional biopsy a left anterior perianal lesion was noted and subsequently excised by me on 12/30/22.  Final pathology showed invasive well-differentiated squamous cell carcinoma measuring 1.1 cm with 3 mm depth of invasion.  All margins were negative for dysplasia or carcinoma.  CT scan of the chest abdomen and pelvis showed no sign of metastatic disease. She was treated for vulvar cancer with Dr. Viktoria.   RT finished in Aug 2025.  She reports no further symptoms. Physical Examination:   There were no vitals filed for this visit.   Gen: NAD  Rectal:well healed L ant. scar   Ing:no LAD L side, incisional scar R side   Procedure: Anoscopy Surgeon: Debby After the risks and benefits were explained, written consent was obtained for above procedure.  A medical assistant chaperone was present thoroughout the entire procedure. Examination chaperoned by Charmaine B Anesthesia: none Diagnosis: anal SCC Findings: No internal lesions noted.  Assessment and Plan:   Leslie [name] is a 69 y.o. female who underwent excisional biopsy for stage I anal margin cancer in Oct 2024.  Also has been treated for vulvar cancer.  No sign of recurrence currently.  We will plan on external exam in 3 months    Diagnoses and all orders for this visit:  Anal squamous cell carcinoma (CMS/HHS-HCC)     Patient with h/o early anal margin squamous cell cancer, s/p excision.  I recommended follow-up every 3 months for at least 2 years and then every 6 months for a total of 5 years with anoscopy yearly.  I do not think she needs any further follow-up CT scans from an anal cancer standpoint.  Return in about 3 months (around 06/24/2024).   The plan was discussed in detail with the patient today, who expressed understanding.  The patient has my contact  information, and understands to call me with any additional questions or concerns in the interval.  I would be happy to see the patient back sooner if the need arises.   Bernarda JAYSON Debby, MD Colon and Rectal Surgery [facility]  "

## 2024-03-28 ENCOUNTER — Ambulatory Visit: Payer: Self-pay | Admitting: Gynecologic Oncology

## 2024-03-28 NOTE — H&P (View-Only) (Signed)
 Gynecologic Oncology Return Clinic Visit  04/06/24  Reason for Visit: follow-up  Treatment History: Oncology History Overview Note  Presented with vulvar pruritus   Vulvar cancer (HCC)  04/13/2023 Surgery   EUA, vulvar biopsy, pap test FINDINGS:  Erythema in groin folds and along inner labia majora, atrophic vaginal introitus with atrophic urethral meatus, normal appearing cervix, thickened and friable clitoral hood, 1.5cm ulcerated lesion on upper inner right labia  SPECIMENS: left labial biopsy, right labial biopsy, clitoral hood biopsy, cervical pap smear    04/13/2023 Initial Biopsy   A. LABIA, RIGHT INNER, BIOPSY:  - Invasive moderately differentiated squamous cell carcinoma, see  comment  B. CLITORAL, BIOPSY:  - Invasive moderately differentiated squamous cell carcinoma, see  comment  C. 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  Moderate hypermetabolism in the anal region, SUV max 7.7.  No pelvic adenopathy.  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  Comment that there are swirling vessels in the root of the small bowel mesentery likely due to loose mesenteric attachment   09/22/2023 - 11/01/2023 Radiation Therapy   IMRT to right inguinal lymph nodes,     Developed vulvar symptoms. Biopsy on 11/03/23 showed squamous atypia consistent with low-grade dysplasia with a focus of marked inflammation and possible erosion, high-grade dysplasia cannot be ruled out, no invasive carcinoma.  11/24/2023: Vulvar biopsies, CO2 laser ablation of the vulva.  03/02/24: Surveillance PET scan showed single hypermetabolic left external iliac lymph node measuring 7 mm.  No other abnormal metabolic activity noted.  Some tracer at the introitus most  consistent with urine contamination.  Interval History: Overall doing well.  Continues to use clobetasol  twice a week.  Has some intermittent itching, notably when she is away from home and has to use toilet paper to wipe.  Uses a bidet at home.  Denies any bleeding or discharge.  Vulva feels the best it has since she has been dealing with lichen sclerosus.  Still has diarrhea that is managed with 1 Imodium most days.  Past Medical/Surgical History: Past Medical History:  Diagnosis Date   Anal squamous cell carcinoma (HCC)    Anxiety    Arthritis    Depression    History of adenomatous polyp of colon 2015   History of hiatal hernia    History of radiation therapy    Right Pelvis-09/21/23-11/01/23- Dr. Lynwood Nasuti   Hypothyroidism    IDA (iron deficiency anemia)    hematolody-- dr a. 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   Vulva cancer [facility])     Past Surgical History:  Procedure Laterality Date   BREAST REDUCTION SURGERY  2001   COLONOSCOPY WITH PROPOFOL   06/04/2013   dr avram   DILATION AND CURETTAGE OF UTERUS     yrs ago   EXAM UNDER ANESTHESIA, PELVIC N/A 11/24/2023   Procedure: EXAM UNDER ANESTHESIA, PELVIC;  Surgeon: Viktoria Comer SAUNDERS, MD;  Location: [facility];  Service: Gynecology;  Laterality: N/A;   INGUINAL LYMPHADENECTOMY Right 08/03/2023   Procedure: LYMPHADENECTOMY, INGUINAL, OPEN; VULVA BIOPSY;  Surgeon: Viktoria Comer SAUNDERS, MD;  Location: [facility];  Service:  Gynecology;  Laterality: Right;   LAPAROSCOPIC GASTRIC BANDING WITH HIATAL HERNIA REPAIR  03/28/2006   @ [facility] by dr b. mikell   LAPAROSCOPIC ROUX-EN-Y GASTRIC BYPASS WITH UPPER ENDOSCOPY AND REMOVAL OF LAP BAND  02/26/2019   @ [facility] by dr j. dasher   LESION DESTRUCTION N/A 11/24/2023   Procedure: DESTRUCTION, LESION, GENITALIA;  Surgeon: Viktoria Comer SAUNDERS, MD;  Location: [facility];  Service: Gynecology;  Laterality: N/A;   LESION REMOVAL N/A 12/30/2022   Procedure: EXCISION BIOPSY PERIANAL LESION;  Surgeon: Debby Hila, MD;  Location: [facility];  Service: General;  Laterality: N/A;   VULVA MARYBETH BIOPSY N/A 04/13/2023   Procedure: VULVAR BIOPSY WITH PAPSMERE;  Surgeon: Jeralyn Crutch, MD;  Location: [facility];  Service: Gynecology;  Laterality: N/A;   VULVA /PERINEUM BIOPSY N/A 11/24/2023   Procedure: BIOPSY, VULVA;  Surgeon: Viktoria Comer SAUNDERS, MD;  Location: [facility];  Service: Gynecology;  Laterality: N/A;   VULVECTOMY N/A 05/09/2023   Procedure: PARTIAL MODIFIED RADICAL VULVECTOMY;  Surgeon: Viktoria Comer SAUNDERS, MD;  Location: [facility];  Service: Gynecology;  Laterality: N/A;   WISDOM TOOTH EXTRACTION      Family History  Problem Relation Age of Onset   Heart attack Mother    Breast cancer Mother    Heart attack Father    Breast cancer Maternal Grandmother        colon cancer   Colon cancer Maternal Grandmother    Diabetes Neg Hx    Hyperlipidemia Neg Hx    Hypertension Neg Hx    Sudden death Neg Hx    Prostate cancer Neg Hx    Pancreatic cancer Neg Hx    Ovarian cancer Neg Hx    Endometrial cancer Neg Hx     Social History   Socioeconomic History   Marital status: Single    Spouse name: Not on file   Number of children: Not on file   Years of education: Not on file   Highest education level: Not on file  Occupational History   Not on file  Tobacco Use   Smoking status: Former    Types: Cigarettes   Smokeless tobacco: Never   Tobacco comments:    04-12-2023  quit smoking 2000,  started age 60  Vaping Use   Vaping status: Never Used  Substance and Sexual Activity   Alcohol use: Not Currently    Comment: rare   Drug use: Never   Sexual activity: Not on file  Other Topics Concern   Not on file  Social History Narrative   Not on file   Social Drivers of Health    Tobacco Use: Medium Risk (04/06/2024)   Patient History    Smoking Tobacco Use: Former    Smokeless Tobacco Use: Never    Passive Exposure: Not on file  Financial Resource Strain: Low Risk (03/26/2024)   Received from [facility]   Overall Financial Resource Strain (CARDIA)    How hard is it for you to pay for the very basics like food, housing, medical care, and heating?: Not hard at all  Food Insecurity: No Food Insecurity (03/26/2024)   Received [facility]   Epic    Within the past 12 months, you worried that your food would run out before you got the money to buy more.: Never true    Within the past 12 months, the food you bought just didn't last and you didn't have money to get more.: Never true  Transportation Needs: No Transportation Needs (03/26/2024)   Received from [facility]    In the past 12 months, has lack of transportation kept you from medical appointments or from getting medications?: No    In the past 12 months, has lack of transportation kept you from meetings, work, or from getting things needed for daily living?: No  Physical Activity: Inactive (03/26/2024)   Received from [facility]   Exercise Vital Sign    On average, how many days per week do you engage in moderate to strenuous exercise (like a brisk walk)?: 0 days    Minutes of Exercise per Session: Not on file  Stress: No Stress Concern Present (03/26/2024)   Received from [facility] - Occupational Stress Questionnaire    Do you feel stress - tense, restless, nervous, or anxious, or unable to sleep at night because your mind is troubled all the time - these days?: Only a little  Social Connections: Somewhat Isolated (03/26/2024)   Received from [facility]   Social Network    How would you rate your social network (family, work, friends)?: Restricted participation with some degree of social isolation  Depression (PHQ2-9): Low Risk (11/03/2023)    Depression (PHQ2-9)    PHQ-2 Score: 3  Alcohol Screen: Not on file  Housing: Low Risk (03/26/2024)   Received from [facility]    In the last 12 months, was there a time when you were not able to pay the mortgage or rent on time?: No    In the past 12 months, how many times have you moved where you were living?: 0    At any time in the past 12 months, were you homeless or living in a shelter (including now)?: No  Utilities: Not At Risk (03/26/2024)   Received from [facility]    In the past 12 months has the electric, gas, oil, or water company threatened to shut off services in your home?: No  Health Literacy: Not on file    Current Medications:  Current Outpatient Medications:    acetaminophen  (TYLENOL ) 500 MG tablet, Take 500 mg by mouth every 6 (six) hours as needed for moderate pain (pain score 4-6)., Disp: , Rfl:    BIOTIN PO, Take 6,000 mcg by mouth daily., Disp: , Rfl:    Calcium  Carb-Cholecalciferol (CALCIUM  500 + D PO), Take 2 tablets by mouth daily., Disp: , Rfl:    Cholecalciferol 50 MCG (2000 UT) TABS, Take 4,000 Units by mouth daily., Disp: , Rfl:    clobetasol  ointment (TEMOVATE ) 0.05 %, APPLY TO THE AFFECTED AREA EVERY NIGHT AT BEDTIME FOR 4 WEEKS, THEN EVERY OTHER DAY FOR 4 WEEKS, THEN TWICE A WEEK FOR 4 WEEKS, Disp: 30 g, Rfl: 5   Ferrous Sulfate (IRON PO), Take 26 mg by mouth daily., Disp: , Rfl:    FLUoxetine  (PROZAC ) 40 MG capsule, Take 40 mg by mouth at bedtime., Disp: , Rfl:    hydrOXYzine  (ATARAX ) 25 MG tablet, TAKE 1 TABLET(25 MG) BY MOUTH AT BEDTIME AS NEEDED FOR ITCHING, Disp: 30 tablet, Rfl: 2   levothyroxine  (SYNTHROID ) 137 MCG tablet, Take 137 mcg by mouth daily before breakfast., Disp: , Rfl:    loratadine (CLARITIN) 10 MG tablet, Take 10 mg by mouth daily as needed for allergies., Disp: , Rfl:    mineral oil-hydrophilic petrolatum  (AQUAPHOR) ointment, Apply topically as needed for dry skin. (Patient not taking:  Reported on 11/03/2023), Disp:  420 g, Rfl: 0   Multiple Vitamin (MULTIVITAMIN) capsule, Take 2 capsules by mouth daily., Disp: , Rfl:    nystatin  (MYCOSTATIN /NYSTOP ) powder, Apply 1 Application topically 3 (three) times daily., Disp: 15 g, Rfl: 1   phentermine 15 MG capsule, Take 15 mg by mouth every morning., Disp: , Rfl:    traMADol  (ULTRAM ) 50 MG tablet, Take 1 tablet (50 mg total) by mouth every 6 (six) hours as needed for severe pain (pain score 7-10). For AFTER surgery only, do not take and drive (Patient not taking: Reported on 01/10/2024), Disp: 10 tablet, Rfl: 0  Review of Systems: + diarrhea Denies appetite changes, fevers, chills, fatigue, unexplained weight changes. Denies hearing loss, neck lumps or masses, mouth sores, ringing in ears or voice changes. Denies cough or wheezing.  Denies shortness of breath. Denies chest pain or palpitations. Denies leg swelling. Denies abdominal distention, pain, blood in stools, constipation, nausea, vomiting, or early satiety. Denies pain with intercourse, dysuria, frequency, hematuria or incontinence. Denies hot flashes, pelvic pain, vaginal bleeding or vaginal discharge.   Denies joint pain, back pain or muscle pain/cramps. Denies rash, or wounds. Denies dizziness, headaches, numbness or seizures. Denies swollen lymph nodes or glands, denies easy bruising or bleeding. Denies anxiety, depression, confusion, or decreased concentration.  Physical Exam: BP (!) 106/55 (BP Location: Left Arm, Patient Position: Sitting)   Pulse 81   Temp 98.7 F (37.1 C) (Oral)   Resp 20   Wt 243 lb 9.6 oz (110.5 kg)   SpO2 98%   BMI (P) 44.56 kg/m  General: Alert, oriented, no acute distress. HEENT: Posterior oropharynx clear, sclera anicteric. Chest: Unlabored breathing on room air.  Lungs clear to auscultation bilaterally. Cardiovascular: Regular rate and rhythm, no murmurs or rubs appreciated. Abdomen: Soft, nondistended, nontender. Lymphatics: No cervical, supraclavicular, or  inguinal adenopathy. GU: Vulva has skin changes consistent with lichen sclerosus and prior surgery.  No erythema or areas of ulceration.  Laboratory & Radiologic Studies: 03/02/24: PET 1. Single hypermetabolic left external iliac lymph node measuring 7 mm (image 168). 2. Interval resolution of previously seen metabolic activity associated with external genitalia. 3. Midline tracer activity at the introitus is most consistent with urine contamination  Assessment & Plan: Leslie [name] is a 69 y.o. 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 PDL1 CPS 70% Completed adjuvant radiation to the right groin in mid August. Given vulvar symptoms, underwent recent biopsy showing low-grade dysplasia with a focal area concerning for inflammation versus high-grade dysplasia. Vulvar biopsies and CO2 laser ablation in 11/2023.   Patient is overall doing well.  Vulvar symptoms were the best control that they have been.  No vulvar lesions seen today.  Reviewed plan for robotic excision of hypermetabolic lymph node and any other enlarged/concerning lymph nodes.  If pathology proves that this is recurrent disease, it is suspected from her imaging, given IMRT given only to the right groin, discussed likely recommendation for EBRT with coverage of the left groin and concurrent chemotherapy with consideration for immunotherapy given CPS score.  We reviewed the plan for a robotic assisted selective lymphadenectomy, possible laparotomy, possible vulvar biopsies, and any other indicated procedures. The risks of surgery were discussed in detail and she understands these to include infection; wound separation; hernia; injury to adjacent organs such as bowel, bladder, blood vessels, ureters and nerves; bleeding which may require blood transfusion; anesthesia risk; thromboembolic events; possible death; unforeseen complications;  possible need for  re-exploration; medical complications such as heart attack, stroke, pleural effusion and pneumonia; and, if full lymphadenectomy is performed the risk of lymphedema and lymphocyst. The patient will receive DVT and antibiotic prophylaxis as indicated. She voiced a clear understanding. She had the opportunity to ask questions. Perioperative instructions were reviewed with her. Prescriptions for post-op medications were sent to her pharmacy of choice.  20 minutes of total time was spent for this patient encounter, including preparation, face-to-face counseling with the patient and coordination of care, and documentation of the encounter.  Comer Dollar MD Gynecologic Oncology

## 2024-03-28 NOTE — Progress Notes (Signed)
 Gynecologic Oncology Return Clinic Visit  04/06/24  Reason for Visit: follow-up  Treatment History: Oncology History Overview Note  Presented with vulvar pruritus   Vulvar cancer (HCC)  04/13/2023 Surgery   EUA, vulvar biopsy, pap test FINDINGS:  Erythema in groin folds and along inner labia majora, atrophic vaginal introitus with atrophic urethral meatus, normal appearing cervix, thickened and friable clitoral hood, 1.5cm ulcerated lesion on upper inner right labia  SPECIMENS: left labial biopsy, right labial biopsy, clitoral hood biopsy, cervical pap smear    04/13/2023 Initial Biopsy   A. LABIA, RIGHT INNER, BIOPSY:  - Invasive moderately differentiated squamous cell carcinoma, see  comment  B. CLITORAL, BIOPSY:  - Invasive moderately differentiated squamous cell carcinoma, see  comment  C. 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  Moderate hypermetabolism in the anal region, SUV max 7.7.  No pelvic adenopathy.  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  Comment that there are swirling vessels in the root of the small bowel mesentery likely due to loose mesenteric attachment   09/22/2023 - 11/01/2023 Radiation Therapy   IMRT to right inguinal lymph nodes,     Developed vulvar symptoms. Biopsy on 11/03/23 showed squamous atypia consistent with low-grade dysplasia with a focus of marked inflammation and possible erosion, high-grade dysplasia cannot be ruled out, no invasive carcinoma.  11/24/2023: Vulvar biopsies, CO2 laser ablation of the vulva.  03/02/24: Surveillance PET scan showed single hypermetabolic left external iliac lymph node measuring 7 mm.  No other abnormal metabolic activity noted.  Some tracer at the introitus most  consistent with urine contamination.  Interval History: Overall doing well.  Continues to use clobetasol  twice a week.  Has some intermittent itching, notably when she is away from home and has to use toilet paper to wipe.  Uses a bidet at home.  Denies any bleeding or discharge.  Vulva feels the best it has since she has been dealing with lichen sclerosus.  Still has diarrhea that is managed with 1 Imodium most days.  Past Medical/Surgical History: Past Medical History:  Diagnosis Date   Anal squamous cell carcinoma (HCC)    Anxiety    Arthritis    Depression    History of adenomatous polyp of colon 2015   History of hiatal hernia    History of radiation therapy    Right Pelvis-09/21/23-11/01/23- Dr. Lynwood Nasuti   Hypothyroidism    IDA (iron deficiency anemia)    hematolody-- dr a. 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   Vulva cancer [facility])     Past Surgical History:  Procedure Laterality Date   BREAST REDUCTION SURGERY  2001   COLONOSCOPY WITH PROPOFOL   06/04/2013   dr avram   DILATION AND CURETTAGE OF UTERUS     yrs ago   EXAM UNDER ANESTHESIA, PELVIC N/A 11/24/2023   Procedure: EXAM UNDER ANESTHESIA, PELVIC;  Surgeon: Viktoria Comer SAUNDERS, MD;  Location: [facility];  Service: Gynecology;  Laterality: N/A;   INGUINAL LYMPHADENECTOMY Right 08/03/2023   Procedure: LYMPHADENECTOMY, INGUINAL, OPEN; VULVA BIOPSY;  Surgeon: Viktoria Comer SAUNDERS, MD;  Location: [facility];  Service:  Gynecology;  Laterality: Right;   LAPAROSCOPIC GASTRIC BANDING WITH HIATAL HERNIA REPAIR  03/28/2006   @ [facility] by dr b. mikell   LAPAROSCOPIC ROUX-EN-Y GASTRIC BYPASS WITH UPPER ENDOSCOPY AND REMOVAL OF LAP BAND  02/26/2019   @ [facility] by dr j. dasher   LESION DESTRUCTION N/A 11/24/2023   Procedure: DESTRUCTION, LESION, GENITALIA;  Surgeon: Viktoria Comer SAUNDERS, MD;  Location: [facility];  Service: Gynecology;  Laterality: N/A;   LESION REMOVAL N/A 12/30/2022   Procedure: EXCISION BIOPSY PERIANAL LESION;  Surgeon: Debby Hila, MD;  Location: [facility];  Service: General;  Laterality: N/A;   VULVA MARYBETH BIOPSY N/A 04/13/2023   Procedure: VULVAR BIOPSY WITH PAPSMERE;  Surgeon: Jeralyn Crutch, MD;  Location: [facility];  Service: Gynecology;  Laterality: N/A;   VULVA /PERINEUM BIOPSY N/A 11/24/2023   Procedure: BIOPSY, VULVA;  Surgeon: Viktoria Comer SAUNDERS, MD;  Location: [facility];  Service: Gynecology;  Laterality: N/A;   VULVECTOMY N/A 05/09/2023   Procedure: PARTIAL MODIFIED RADICAL VULVECTOMY;  Surgeon: Viktoria Comer SAUNDERS, MD;  Location: [facility];  Service: Gynecology;  Laterality: N/A;   WISDOM TOOTH EXTRACTION      Family History  Problem Relation Age of Onset   Heart attack Mother    Breast cancer Mother    Heart attack Father    Breast cancer Maternal Grandmother        colon cancer   Colon cancer Maternal Grandmother    Diabetes Neg Hx    Hyperlipidemia Neg Hx    Hypertension Neg Hx    Sudden death Neg Hx    Prostate cancer Neg Hx    Pancreatic cancer Neg Hx    Ovarian cancer Neg Hx    Endometrial cancer Neg Hx     Social History   Socioeconomic History   Marital status: Single    Spouse name: Not on file   Number of children: Not on file   Years of education: Not on file   Highest education level: Not on file  Occupational History   Not on file  Tobacco Use   Smoking status: Former    Types: Cigarettes   Smokeless tobacco: Never   Tobacco comments:    04-12-2023  quit smoking 2000,  started age 60  Vaping Use   Vaping status: Never Used  Substance and Sexual Activity   Alcohol use: Not Currently    Comment: rare   Drug use: Never   Sexual activity: Not on file  Other Topics Concern   Not on file  Social History Narrative   Not on file   Social Drivers of Health    Tobacco Use: Medium Risk (04/06/2024)   Patient History    Smoking Tobacco Use: Former    Smokeless Tobacco Use: Never    Passive Exposure: Not on file  Financial Resource Strain: Low Risk (03/26/2024)   Received from [facility]   Overall Financial Resource Strain (CARDIA)    How hard is it for you to pay for the very basics like food, housing, medical care, and heating?: Not hard at all  Food Insecurity: No Food Insecurity (03/26/2024)   Received from [facility]   Epic    Within the past 12 months, you worried that your food would run out before you got the money to buy more.: Never true    Within the past 12 months, the food you bought just didn't last and you didn't have money to get more.: Never true  Transportation Needs: No Transportation Needs (03/26/2024)   Received from [facility]    In the past 12 months, has lack of transportation kept you from medical appointments or from getting medications?: No    In the past 12 months, has lack of transportation kept you from meetings, work, or from getting things needed for daily living?: No  Physical Activity: Inactive (03/26/2024)   Received from [facility]   Exercise Vital Sign    On average, how many days per week do you engage in moderate to strenuous exercise (like a brisk walk)?: 0 days    Minutes of Exercise per Session: Not on file  Stress: No Stress Concern Present (03/26/2024)   Received from [facility] - Occupational Stress Questionnaire    Do you feel stress - tense, restless, nervous, or anxious, or unable to sleep at night because your mind is troubled all the time - these days?: Only a little  Social Connections: Somewhat Isolated (03/26/2024)   Received from [facility]   Social Network    How would you rate your social network (family, work, friends)?: Restricted participation with some degree of social isolation  Depression (PHQ2-9): Low Risk (11/03/2023)    Depression (PHQ2-9)    PHQ-2 Score: 3  Alcohol Screen: Not on file  Housing: Low Risk (03/26/2024)   Received from [facility]    In the last 12 months, was there a time when you were not able to pay the mortgage or rent on time?: No    In the past 12 months, how many times have you moved where you were living?: 0    At any time in the past 12 months, were you homeless or living in a shelter (including now)?: No  Utilities: Not At Risk (03/26/2024)   Received from [facility]    In the past 12 months has the electric, gas, oil, or water company threatened to shut off services in your home?: No  Health Literacy: Not on file    Current Medications:  Current Outpatient Medications:    acetaminophen  (TYLENOL ) 500 MG tablet, Take 500 mg by mouth every 6 (six) hours as needed for moderate pain (pain score 4-6)., Disp: , Rfl:    BIOTIN PO, Take 6,000 mcg by mouth daily., Disp: , Rfl:    Calcium  Carb-Cholecalciferol (CALCIUM  500 + D PO), Take 2 tablets by mouth daily., Disp: , Rfl:    Cholecalciferol 50 MCG (2000 UT) TABS, Take 4,000 Units by mouth daily., Disp: , Rfl:    clobetasol  ointment (TEMOVATE ) 0.05 %, APPLY TO THE AFFECTED AREA EVERY NIGHT AT BEDTIME FOR 4 WEEKS, THEN EVERY OTHER DAY FOR 4 WEEKS, THEN TWICE A WEEK FOR 4 WEEKS, Disp: 30 g, Rfl: 5   Ferrous Sulfate (IRON PO), Take 26 mg by mouth daily., Disp: , Rfl:    FLUoxetine  (PROZAC ) 40 MG capsule, Take 40 mg by mouth at bedtime., Disp: , Rfl:    hydrOXYzine  (ATARAX ) 25 MG tablet, TAKE 1 TABLET(25 MG) BY MOUTH AT BEDTIME AS NEEDED FOR ITCHING, Disp: 30 tablet, Rfl: 2   levothyroxine  (SYNTHROID ) 137 MCG tablet, Take 137 mcg by mouth daily before breakfast., Disp: , Rfl:    loratadine (CLARITIN) 10 MG tablet, Take 10 mg by mouth daily as needed for allergies., Disp: , Rfl:    mineral oil-hydrophilic petrolatum  (AQUAPHOR) ointment, Apply topically as needed for dry skin. (Patient not taking:  Reported on 11/03/2023), Disp:  420 g, Rfl: 0   Multiple Vitamin (MULTIVITAMIN) capsule, Take 2 capsules by mouth daily., Disp: , Rfl:    nystatin  (MYCOSTATIN /NYSTOP ) powder, Apply 1 Application topically 3 (three) times daily., Disp: 15 g, Rfl: 1   phentermine 15 MG capsule, Take 15 mg by mouth every morning., Disp: , Rfl:    traMADol  (ULTRAM ) 50 MG tablet, Take 1 tablet (50 mg total) by mouth every 6 (six) hours as needed for severe pain (pain score 7-10). For AFTER surgery only, do not take and drive (Patient not taking: Reported on 01/10/2024), Disp: 10 tablet, Rfl: 0  Review of Systems: + diarrhea Denies appetite changes, fevers, chills, fatigue, unexplained weight changes. Denies hearing loss, neck lumps or masses, mouth sores, ringing in ears or voice changes. Denies cough or wheezing.  Denies shortness of breath. Denies chest pain or palpitations. Denies leg swelling. Denies abdominal distention, pain, blood in stools, constipation, nausea, vomiting, or early satiety. Denies pain with intercourse, dysuria, frequency, hematuria or incontinence. Denies hot flashes, pelvic pain, vaginal bleeding or vaginal discharge.   Denies joint pain, back pain or muscle pain/cramps. Denies rash, or wounds. Denies dizziness, headaches, numbness or seizures. Denies swollen lymph nodes or glands, denies easy bruising or bleeding. Denies anxiety, depression, confusion, or decreased concentration.  Physical Exam: BP (!) 106/55 (BP Location: Left Arm, Patient Position: Sitting)   Pulse 81   Temp 98.7 F (37.1 C) (Oral)   Resp 20   Wt 243 lb 9.6 oz (110.5 kg)   SpO2 98%   BMI (P) 44.56 kg/m  General: Alert, oriented, no acute distress. HEENT: Posterior oropharynx clear, sclera anicteric. Chest: Unlabored breathing on room air.  Lungs clear to auscultation bilaterally. Cardiovascular: Regular rate and rhythm, no murmurs or rubs appreciated. Abdomen: Soft, nondistended, nontender. Lymphatics: No cervical, supraclavicular, or  inguinal adenopathy. GU: Vulva has skin changes consistent with lichen sclerosus and prior surgery.  No erythema or areas of ulceration.  Laboratory & Radiologic Studies: 03/02/24: PET 1. Single hypermetabolic left external iliac lymph node measuring 7 mm (image 168). 2. Interval resolution of previously seen metabolic activity associated with external genitalia. 3. Midline tracer activity at the introitus is most consistent with urine contamination  Assessment & Plan: Leslie [name] is a 69 y.o. 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 PDL1 CPS 70% Completed adjuvant radiation to the right groin in mid August. Given vulvar symptoms, underwent recent biopsy showing low-grade dysplasia with a focal area concerning for inflammation versus high-grade dysplasia. Vulvar biopsies and CO2 laser ablation in 11/2023.   Patient is overall doing well.  Vulvar symptoms were the best control that they have been.  No vulvar lesions seen today.  Reviewed plan for robotic excision of hypermetabolic lymph node and any other enlarged/concerning lymph nodes.  If pathology proves that this is recurrent disease, it is suspected from her imaging, given IMRT given only to the right groin, discussed likely recommendation for EBRT with coverage of the left groin and concurrent chemotherapy with consideration for immunotherapy given CPS score.  We reviewed the plan for a robotic assisted selective lymphadenectomy, possible laparotomy, possible vulvar biopsies, and any other indicated procedures. The risks of surgery were discussed in detail and she understands these to include infection; wound separation; hernia; injury to adjacent organs such as bowel, bladder, blood vessels, ureters and nerves; bleeding which may require blood transfusion; anesthesia risk; thromboembolic events; possible death; unforeseen complications;  possible need for  re-exploration; medical complications such as heart attack, stroke, pleural effusion and pneumonia; and, if full lymphadenectomy is performed the risk of lymphedema and lymphocyst. The patient will receive DVT and antibiotic prophylaxis as indicated. She voiced a clear understanding. She had the opportunity to ask questions. Perioperative instructions were reviewed with her. Prescriptions for post-op medications were sent to her pharmacy of choice.  20 minutes of total time was spent for this patient encounter, including preparation, face-to-face counseling with the patient and coordination of care, and documentation of the encounter.  Comer Dollar MD Gynecologic Oncology

## 2024-03-29 ENCOUNTER — Telehealth: Payer: Self-pay | Admitting: Gynecologic Oncology

## 2024-03-29 NOTE — Telephone Encounter (Signed)
 Called the patient to discuss recent PET scan.  She has a hypermetabolic left external iliac lymph node that is concerning for metastatic disease.  Discussed with her that because she did not receive radiation on the left, I would favor surgical excision which would be done robotically followed by adjuvant radiation with concurrent sensitizing chemotherapy.    Patient is scheduled to see us  next week.  I will let the office know that we will use this as a preoperative visit.  I will reach out to the radiologist about feasibility of biopsying this lymph node.  Comer Dollar MD Gynecologic Oncology

## 2024-04-06 ENCOUNTER — Inpatient Hospital Stay: Admitting: Gynecologic Oncology

## 2024-04-06 ENCOUNTER — Inpatient Hospital Stay: Attending: Gynecologic Oncology | Admitting: Gynecologic Oncology

## 2024-04-06 ENCOUNTER — Encounter: Payer: Self-pay | Admitting: Gynecologic Oncology

## 2024-04-06 VITALS — BP 106/55 | HR 81 | Temp 98.7°F | Resp 20 | Wt 243.6 lb

## 2024-04-06 DIAGNOSIS — Z08 Encounter for follow-up examination after completed treatment for malignant neoplasm: Secondary | ICD-10-CM | POA: Insufficient documentation

## 2024-04-06 DIAGNOSIS — C519 Malignant neoplasm of vulva, unspecified: Secondary | ICD-10-CM

## 2024-04-06 DIAGNOSIS — Z8544 Personal history of malignant neoplasm of other female genital organs: Secondary | ICD-10-CM | POA: Insufficient documentation

## 2024-04-06 DIAGNOSIS — Z923 Personal history of irradiation: Secondary | ICD-10-CM | POA: Diagnosis not present

## 2024-04-06 NOTE — Patient Instructions (Signed)
 Preparing for your Surgery  Plan for surgery on 04/19/2024 with Dr. Comer Dollar at [facility]. You will be scheduled for a robotic assisted pelvic lymphadenectomy (removal of pelvic lymph node).   Pre-operative Testing -You will receive a phone call from presurgical testing at [facility] to arrange for a pre-operative appointment and lab work.  -Bring your insurance card, copy of an advanced directive if applicable, medication list  -At that visit, you will be asked to sign a consent for a possible blood transfusion in case a transfusion becomes necessary during surgery.  The need for a blood transfusion is rare but having consent is a necessary part of your care.     -You should not be taking blood thinners or aspirin  at least ten days prior to surgery unless instructed by your surgeon.  -Do not take supplements such as fish oil (omega 3), red yeast rice, turmeric before your surgery. STOP TAKING AT LEAST 10 DAYS BEFORE SURGERY. You want to avoid medications with aspirin  in them including headache powders such as BC or Goody's), Excedrin migraine.  -If you are taking a GLP-1 medication/injection such as Ozempic, Mounjaro, [id number], this needs to be held before surgery for at least 7 days before.  Day Before Surgery at Home -You will be asked to take in a light diet the day before surgery. You will be advised you can have clear liquids up until 3 hours before your surgery.    Eat a light diet the day before surgery.  Examples including soups, broths, toast, yogurt, mashed potatoes.  AVOID GAS PRODUCING FOODS AND BEVERAGES. Things to avoid include carbonated beverages (fizzy beverages, sodas), raw fruits and raw vegetables (uncooked), or beans.   If your bowels are filled with gas, your surgeon will have difficulty visualizing your pelvic organs which increases your surgical risks.  Your role in recovery Your role is to become active as soon as directed by your doctor,  while still giving yourself time to heal.  Rest when you feel tired. You will be asked to do the following in order to speed your recovery:  - Cough and breathe deeply. This helps to clear and expand your lungs and can prevent pneumonia after surgery.  - STAY ACTIVE WHEN YOU GET HOME. Do mild physical activity. Walking or moving your legs help your circulation and body functions return to normal. Do not try to get up or walk alone the first time after surgery.   -If you develop swelling on one leg or the other, pain in the back of your leg, redness/warmth in one of your legs, please call the office or go to the Emergency Room to have a doppler to rule out a blood clot. For shortness of breath, chest pain-seek care in the Emergency Room as soon as possible. - Actively manage your pain. Managing your pain lets you move in comfort. We will ask you to rate your pain on a scale of zero to 10. It is your responsibility to tell your doctor or nurse where and how much you hurt so your pain can be treated.  Special Considerations -If you are diabetic, you may be placed on insulin after surgery to have closer control over your blood sugars to promote healing and recovery.  This does not mean that you will be discharged on insulin.  If applicable, your oral antidiabetics will be resumed when you are tolerating a solid diet.  -Your final pathology results from surgery should be available around one  week after surgery and the results will be relayed to you when available.  -FMLA forms can be faxed to [phone] and please allow 5-7 business days for completion.  Pain Management After Surgery -You will be prescribed your pain medication and bowel regimen medications before surgery so that you can have these available when you are discharged from the hospital. The pain medication is for use ONLY AFTER surgery and a new prescription will not be given.   -Make sure that you have Tylenol  and Ibuprofen  IF YOU ARE  ABLE TO TAKE THESE MEDICATIONS at home to use on a regular basis after surgery for pain control. We recommend alternating the medications every hour to six hours since they work differently and are processed in the body differently for pain relief.  -Review the attached handout on narcotic use and their risks and side effects.   Bowel Regimen -You will be prescribed Sennakot-S to take nightly to prevent constipation especially if you are taking the narcotic pain medication intermittently.  It is important to prevent constipation and drink adequate amounts of liquids. You can stop taking this medication when you are not taking pain medication and you are back on your normal bowel routine.  Risks of Surgery Risks of surgery are low but include bleeding, infection, damage to surrounding structures, re-operation, blood clots, and very rarely death.   Blood Transfusion Information (For the consent to be signed before surgery)  We will be checking your blood type before surgery so in case of emergencies, we will know what type of blood you would need.                                            WHAT IS A BLOOD TRANSFUSION?  A transfusion is the replacement of blood or some of its parts. Blood is made up of multiple cells which provide different functions. Red blood cells carry oxygen and are used for blood loss replacement. White blood cells fight against infection. Platelets control bleeding. Plasma helps clot blood. Other blood products are available for specialized needs, such as hemophilia or other clotting disorders. BEFORE THE TRANSFUSION  Who gives blood for transfusions?  You may be able to donate blood to be used at a later date on yourself (autologous donation). Relatives can be asked to donate blood. This is generally not any safer than if you have received blood from a stranger. The same precautions are taken to ensure safety when a relative's blood is donated. Healthy volunteers who  are fully evaluated to make sure their blood is safe. This is blood bank blood. Transfusion therapy is the safest it has ever been in the practice of medicine. Before blood is taken from a donor, a complete history is taken to make sure that person has no history of diseases nor engages in risky social behavior (examples are intravenous drug use or sexual activity with multiple partners). The donor's travel history is screened to minimize risk of transmitting infections, such as malaria. The donated blood is tested for signs of infectious diseases, such as HIV and hepatitis. The blood is then tested to be sure it is compatible with you in order to minimize the chance of a transfusion reaction. If you or a relative donates blood, this is often done in anticipation of surgery and is not appropriate for emergency situations. It takes many days to process the  donated blood. RISKS AND COMPLICATIONS Although transfusion therapy is very safe and saves many lives, the main dangers of transfusion include:  Getting an infectious disease. Developing a transfusion reaction. This is an allergic reaction to something in the blood you were given. Every precaution is taken to prevent this. The decision to have a blood transfusion has been considered carefully by your caregiver before blood is given. Blood is not given unless the benefits outweigh the risks.  AFTER SURGERY INSTRUCTIONS  Return to work: 4-6 weeks if applicable  Activity: 1. Be up and out of the bed during the day.  Take a nap if needed.  You may walk up steps but be careful and use the hand rail.  Stair climbing will tire you more than you think, you may need to stop part way and rest.   2. No lifting or straining for 6 weeks over 10 pounds. No pushing, pulling, straining for 6 weeks.  3. No driving for 4-89 days when the following criteria have been met: Do not drive if you are taking narcotic pain medicine and make sure that your reaction time has  returned.   4. You can shower as soon as the next day after surgery. Shower daily.  Use your regular soap and water (not directly on the incision) and pat your incision(s) dry afterwards; don't rub.  No tub baths or submerging your body in water until cleared by your surgeon. If you have the soap that was given to you by pre-surgical testing that was used before surgery, you do not need to use it afterwards because this can irritate your incisions.   5. No sexual activity and nothing in the vagina for 4-6 weeks.  6. You may experience a small amount of clear drainage from your incisions, which is normal.  If the drainage persists, increases, or changes color please call the office.  7. Do not use creams, lotions, or ointments such as neosporin on your incisions after surgery until advised by your surgeon because they can cause removal of the dermabond glue on your incisions.    8. Take Tylenol  or ibuprofen  first for pain if you are able to take these medications and only use narcotic pain medication for severe pain not relieved by the Tylenol  or Ibuprofen .  Monitor your Tylenol  intake to a max of 4,000 mg in a 24 hour period. You can alternate these medications after surgery.  Diet: 1. Low sodium Heart Healthy Diet is recommended but you are cleared to resume your normal (before surgery) diet after your procedure.  2. It is safe to use a laxative, such as Miralax or Colace, if you have difficulty moving your bowels before surgery. You have been prescribed Sennakot-S to take at bedtime every evening after surgery to keep bowel movements regular and to prevent constipation.    Wound Care: 1. Keep clean and dry.  Shower daily.  Reasons to call the Doctor: Fever - Oral temperature greater than 100.4 degrees Fahrenheit Foul-smelling vaginal discharge Difficulty urinating Nausea and vomiting Increased pain at the site of the incision that is unrelieved with pain medicine. Difficulty breathing  with or without chest pain New calf pain especially if only on one side Sudden, continuing increased vaginal bleeding with or without clots.   Contacts: For questions or concerns you should contact:  Dr. Comer Dollar at [phone]  Eleanor Epps, NP at [phone]  After Hours: call [phone] and have the GYN Oncologist paged/contacted (after 5 pm or on the weekends).  You will speak with an after hours RN and let he or she know you have had surgery.  Messages sent via mychart are for non-urgent matters and are not responded to after hours so for urgent needs, please call the after hours number.

## 2024-04-06 NOTE — Progress Notes (Signed)
 Patient here for a follow up with Dr. Viktoria and for a pre-operative appointment prior to her scheduled surgery on 04/19/2024. She is scheduled for a robotic assisted pelvic lymphadenectomy (removal of pelvic lymph node). She had her pre-admission testing appointment this am at [facility].  The surgery was discussed in detail.  See after visit summary for additional details.   Discussed post-op pain management in detail including the aspects of the enhanced recovery pathway.  We discussed the use of tylenol  post-op and to monitor for a maximum of 4,000 mg in a 24 hour period.  Also discussed using sennakot after surgery and to hold if having loose stools.  Discussed bowel regimen in detail.     Discussed the use of SCDs and measures to take at home to prevent DVT including frequent mobility.  Reportable signs and symptoms of DVT discussed. Post-operative instructions discussed and expectations for after surgery. Incisional care discussed as well including reportable signs and symptoms including erythema, drainage, wound separation.     30 minutes spent with the patient.  Verbalizing understanding of material discussed. No needs or concerns voiced at the end of the visit.   Advised patient to call for any needs.  Advised that her post-operative medications had been prescribed and could be picked up at any time.    This appointment is included in the global surgical bundle as pre-operative teaching and has no charge.

## 2024-04-15 NOTE — Progress Notes (Signed)
 The patient was identified using 2 approved identifiers. All issues noted in this document were discussed and addressed, Ms            voiced understanding and agreement with all preoperative instructions.   The patient was instructed to come to PST on     ????????? To have labs performed to complete her Preop interview.   Medication Recon:  04-10-2024  Date of any COVID positive Test in last 90 days:  PCP - Bernita Purchase, PA, Alm Bilis, MD  Cardiologist -   Chest x-ray - 12-13-2018  2v  in [facility] EKG - 12-13-2018 in [facility] (preop for Bariatric surgery) no hx to warrant repeat  Stress Test -  ECHO -  Cardiac Cath -  CT Coronary Calcium  score:  ZIO monitor-   Pacemaker / ICD device [x]  No []  Yes   Spinal Cord Stimulator:[x]  No []  Yes       History of Sleep Apnea? [x]  No []  Yes   CPAP used?- [x]  No []  Yes    Medication on DOS: levothyroxine  (SYNTHROID ), acetaminophen  (TYLENOL ) prn  Hold DOS:  ? (takes Fluoxetine  (PROZAC ) and hydroxyzine  (ATARAX ) q hs )  Patient has: [x]  NO Hx DM   []  Pre-DM   []  DM1  []   DM2 Does the patient monitor blood sugar?   [x]  N/A   []  No []  Yes   Blood Thinner / Instructions: none Aspirin  Instructions:  none  Activity level: Able to walk up 2 flights of stairs without becoming significantly short of breath or having chest pain?   [x]    Yes   []  No,  would have:  Patient can perform ADLs without assistance.  [x]   Yes    []  No   Comments: Phentermine - on hold for surgery  Anesthesia review: Anxiety, anemia- s/p gastric bypass, RLS,   Patient denies any S&S of respiratory illness or Covid - no shortness of breath, fever, cough or chest pain at PAT appointment.  Patient verbalized understanding and agreement to the Pre-Surgical Instructions that were given to them at this PAT appointment. Patient was also educated of the need to review these PAT instructions again prior to her surgery.I reviewed the appropriate phone numbers to call if they have any and  questions or concerns.

## 2024-04-15 NOTE — Patient Instructions (Signed)
 SURGICAL WAITING ROOM VISITATION Patients having surgery or a procedure may have no more than 2 support people in the waiting area - these visitors may rotate in the visitor waiting room.   If the patient needs to stay at the hospital during part of their recovery, the visitor guidelines for inpatient rooms apply.  PRE-OP VISITATION  Pre-op nurse will coordinate an appropriate time for 1 support person to accompany the patient in pre-op.  This support person may not rotate.  This visitor will be contacted when the time is appropriate for the visitor to come back in the pre-op area.  Temporary Visitor Restrictions   Children ages 31 and under will not be able to visit patients in [facility] under most circumstances. Visitation is not restricted outside of hospitals unless noted otherwise in the [facility] and Location Specific Visitation Guidelines at :       [email or web address]  Visitors with respiratory illnesses are discouraged from visiting and should remain at home.  You are not required to quarantine at this time prior to your surgery. However, you must do this: Hand Hygiene often Do NOT share personal items Notify your provider if you are in close contact with someone who has COVID or you develop fever 100.4 or greater, new onset of sneezing, cough, sore throat, shortness of breath or body aches.  If you test positive for Covid or have been in contact with anyone that has tested positive in the last 10 days please notify you surgeon.    Your procedure is scheduled on:  THURSDAY  04-19-2924  Report to [facility] Main Entrance: [address] entrance where the [employer] is available.   Report to admitting at: 05:15 AM  Call this number if you have any questions or problems the morning of surgery [phone]  FOLLOW ANY ADDITIONAL PRE OP INSTRUCTIONS YOU RECEIVED FROM YOUR SURGEON'S OFFICE!!!  Eat a light diet the day before surgery.  Examples including soups,  broths, toast, yogurt, mashed potatoes.  Things to avoid include carbonated beverages (fizzy beverages), raw fruits and raw vegetables, or beans.   If your bowels are filled with gas, your surgeon will have difficulty visualizing your pelvic organs which increases your surgical risks. Do not eat food after Midnight the night prior to your surgery/procedure.  After Midnight you may have the following liquids until   04:30 AM DAY OF SURGERY  Clear Liquid Diet Water  Black Coffee (sugar ok, NO MILK/CREAM OR CREAMERS)  Tea (sugar ok, NO MILK/CREAM OR CREAMERS) regular and decaf                             Plain Jell-O  with no fruit (NO RED)                                           Fruit ices (not with fruit pulp, NO RED)                                     Popsicles (NO RED)  Juice: NO CITRUS JUICES: only apple, WHITE grape, WHITE cranberry Sports drinks like Gatorade or Powerade (NO RED)            Oral Hygiene is also important to reduce your risk of infection.        Remember - BRUSH YOUR TEETH THE MORNING OF SURGERY WITH YOUR REGULAR TOOTHPASTE  Do NOT smoke after Midnight the night before surgery.  STOP TAKING all Vitamins, Herbs and supplements 1 week before your surgery.   Take ONLY these medicines the morning of surgery with A SIP OF WATER : levothyroxine  (SYNTHROID ), acetaminophen  (TYLENOL ) prn            You may not have any metal on your body including hair pins, jewelry, and body piercing  Do not wear make-up, lotions, powders, perfumes  or deodorant  Do not wear nail polish including gel and S&S, artificial / acrylic nails, or any other type of covering on natural nails including finger and toenails. If you have artificial nails, gel coating, etc., that needs to be removed by a nail salon, Please have this removed prior to surgery. Not doing so may mean that your surgery could be cancelled or delayed if the Surgeon  or anesthesia staff feels like they are unable to monitor you safely.   Do not shave 48 hours prior to surgery to avoid nicks in your skin which may contribute to postoperative infections.   Contacts, Hearing Aids, dentures or bridgework may not be worn into surgery. DENTURES WILL BE REMOVED PRIOR TO SURGERY PLEASE DO NOT APPLY Poly grip OR ADHESIVES!!!  Patients discharged on the day of surgery will not be allowed to drive home.  Someone NEEDS to stay with you for the first 24 hours after anesthesia.  Do not bring your home medications to the hospital. The Pharmacy will dispense medications listed on your medication list to you during your admission in the Hospital.  Please read over the following fact sheets you were given: IF YOU HAVE QUESTIONS ABOUT YOUR PRE-OP INSTRUCTIONS, PLEASE CALL [phone]  [facility] - Preparing for Surgery           Before surgery, you can play an important role.  Because skin is not sterile, your skin needs to be as free of germs as possible.  You can reduce the number of germs on your skin by washing with CHG (chlorahexidine gluconate) soap before surgery.  CHG is an antiseptic cleaner which kills germs and bonds with the skin to continue killing germs even after washing. Please DO NOT use if you have an allergy to CHG or antibacterial soaps.  If your skin becomes reddened/irritated stop using the CHG and inform your nurse when you arrive at Short Stay. Do not shave (including legs and underarms) for at least 48 hours prior to the first CHG shower.  You may shave your face/neck.  Please follow these instructions carefully:  1.  Shower with CHG Soap the night before surgery ONLY (DO NOT USE THE CHG SOAP THE MORNING OF SURGERY).  2.  If you choose to wash your hair, wash your hair first as usual with your normal  shampoo.  3.  After you shampoo, rinse your hair and body thoroughly to remove the shampoo.                             4.  Use CHG as you would  any other liquid soap.  You can  apply chg directly to the skin and wash.  Gently with a scrungie or clean washcloth.  5.  Apply the CHG Soap to your body ONLY FROM THE NECK DOWN.   Do not use on face/ open                           Wound or open sores. Avoid contact with eyes, ears mouth and genitals (private parts).                       Wash face,  Genitals (private parts) with your normal soap.             6.  Wash thoroughly, paying special attention to the area where your  surgery  will be performed.  7.  Thoroughly rinse your body with warm water  from the neck down.  8.  DO NOT shower/wash with your normal soap after using and rinsing off the CHG Soap.                9.  Pat yourself dry with a clean towel.            10.  Wear clean pajamas.            11.  Place clean sheets on your bed the night of your first shower and do not  sleep with pets.  Day of Surgery : Do not apply any CHG, lotions/deodorants the morning of surgery.  Please wear clean clothes to the hospital/surgery center.   FAILURE TO FOLLOW THESE INSTRUCTIONS MAY RESULT IN THE CANCELLATION OF YOUR SURGERY  PATIENT SIGNATURE_________________________________  NURSE SIGNATURE__________________________________  ________________________________________________________________________

## 2024-04-16 ENCOUNTER — Encounter (HOSPITAL_COMMUNITY): Payer: Self-pay

## 2024-04-16 ENCOUNTER — Other Ambulatory Visit: Payer: Self-pay

## 2024-04-16 ENCOUNTER — Encounter (HOSPITAL_COMMUNITY)
Admission: RE | Admit: 2024-04-16 | Discharge: 2024-04-16 | Disposition: A | Discharge status: Home / Self Care | Source: Ambulatory Visit | Attending: Gynecologic Oncology | Admitting: Gynecologic Oncology

## 2024-04-18 ENCOUNTER — Encounter (HOSPITAL_COMMUNITY)
Admission: RE | Admit: 2024-04-18 | Discharge: 2024-04-18 | Disposition: A | Discharge status: Home / Self Care | Source: Ambulatory Visit | Attending: Gynecologic Oncology | Admitting: Gynecologic Oncology

## 2024-04-18 ENCOUNTER — Telehealth: Payer: Self-pay | Admitting: *Deleted

## 2024-04-18 ENCOUNTER — Encounter (HOSPITAL_COMMUNITY): Payer: Self-pay

## 2024-04-18 ENCOUNTER — Other Ambulatory Visit: Payer: Self-pay | Admitting: Gynecologic Oncology

## 2024-04-18 DIAGNOSIS — Z01812 Encounter for preprocedural laboratory examination: Secondary | ICD-10-CM | POA: Insufficient documentation

## 2024-04-18 DIAGNOSIS — C519 Malignant neoplasm of vulva, unspecified: Secondary | ICD-10-CM | POA: Diagnosis present

## 2024-04-18 DIAGNOSIS — R948 Abnormal results of function studies of other organs and systems: Secondary | ICD-10-CM | POA: Diagnosis not present

## 2024-04-18 DIAGNOSIS — Z01818 Encounter for other preprocedural examination: Secondary | ICD-10-CM | POA: Diagnosis present

## 2024-04-18 LAB — CBC
HCT: 40.1 % (ref 36.0–46.0)
Hemoglobin: 11.9 g/dL — ABNORMAL LOW (ref 12.0–15.0)
MCH: 24.7 pg — ABNORMAL LOW (ref 26.0–34.0)
MCHC: 29.7 g/dL — ABNORMAL LOW (ref 30.0–36.0)
MCV: 83.4 fL (ref 80.0–100.0)
Platelets: 335 10*3/uL (ref 150–400)
RBC: 4.81 MIL/uL (ref 3.87–5.11)
RDW: 14.6 % (ref 11.5–15.5)
WBC: 9.8 10*3/uL (ref 4.0–10.5)
nRBC: 0 % (ref 0.0–0.2)

## 2024-04-18 LAB — COMPREHENSIVE METABOLIC PANEL WITH GFR
ALT: 37 U/L (ref 0–44)
AST: 42 U/L — ABNORMAL HIGH (ref 15–41)
Albumin: 3.7 g/dL (ref 3.5–5.0)
Alkaline Phosphatase: 179 U/L — ABNORMAL HIGH (ref 38–126)
Anion gap: 11 (ref 5–15)
BUN: 25 mg/dL — ABNORMAL HIGH (ref 8–23)
CO2: 23 mmol/L (ref 22–32)
Calcium: 9.1 mg/dL (ref 8.9–10.3)
Chloride: 103 mmol/L (ref 98–111)
Creatinine, Ser: 0.7 mg/dL (ref 0.44–1.00)
GFR, Estimated: >60 mL/min
Glucose, Bld: 99 mg/dL (ref 70–99)
Potassium: 4.5 mmol/L (ref 3.5–5.1)
Sodium: 137 mmol/L (ref 135–145)
Total Bilirubin: 0.2 mg/dL (ref 0.0–1.2)
Total Protein: 7.3 g/dL (ref 6.5–8.1)

## 2024-04-18 MED ORDER — OXYCODONE HCL 5 MG PO TABS: 5.0000 mg | ORAL_TABLET | ORAL | 0 refills | Status: AC | PRN

## 2024-04-18 MED ORDER — SENNOSIDES-DOCUSATE SODIUM 8.6-50 MG PO TABS: 2.0000 | ORAL_TABLET | Freq: Every day | ORAL | 0 refills | Status: AC

## 2024-04-18 NOTE — Telephone Encounter (Signed)
 Telephone call to check on pre-operative status.  Patient compliant with pre-operative instructions.  Reinforced nothing to eat after midnight. Clear liquids until 0415. Patient to arrive at 0515.  No questions or concerns voiced.  Instructed to call for any needs.

## 2024-04-18 NOTE — Discharge Instructions (Signed)
 AFTER SURGERY INSTRUCTIONS   Return to work: 4-6 weeks if applicable  Today Dr. Viktoria took biopsies from the vulva as well as removal of the left lymph nodes seen on PET. You may need to use a peri bottle when urinating if having discomfort and pat dry.  One of your liver function tests checked with your preop labs was elevated. This is called alkaline phosphatase. We will plan to recheck this at your post-op appt and this will be something to follow up with your PCP for. It can be elevated for different reasons so we will repeat this.    Activity: 1. Be up and out of the bed during the day.  Take a nap if needed.  You may walk up steps but be careful and use the hand rail.  Stair climbing will tire you more than you think, you may need to stop part way and rest.    2. No lifting or straining for 6 weeks over 10 pounds. No pushing, pulling, straining for 6 weeks.   3. No driving for 4-89 days when the following criteria have been met: Do not drive if you are taking narcotic pain medicine and make sure that your reaction time has returned.    4. You can shower as soon as the next day after surgery. Shower daily.  Use your regular soap and water  (not directly on the incision) and pat your incision(s) dry afterwards; don't rub.  No tub baths or submerging your body in water  until cleared by your surgeon. If you have the soap that was given to you by pre-surgical testing that was used before surgery, you do not need to use it afterwards because this can irritate your incisions.    5. No sexual activity and nothing in the vagina for 4-6 weeks.   6. You may experience a small amount of clear drainage from your incisions, which is normal.  If the drainage persists, increases, or changes color please call the office.   7. Do not use creams, lotions, or ointments such as neosporin on your incisions after surgery until advised by your surgeon because they can cause removal of the dermabond glue on your  incisions.     8. Take Tylenol  or ibuprofen  first for pain if you are able to take these medications and only use narcotic pain medication for severe pain not relieved by the Tylenol  or Ibuprofen .  Monitor your Tylenol  intake to a max of 4,000 mg in a 24 hour period. You can alternate these medications after surgery.   Diet: 1. Low sodium Heart Healthy Diet is recommended but you are cleared to resume your normal (before surgery) diet after your procedure.   2. It is safe to use a laxative, such as Miralax or Colace, if you have difficulty moving your bowels before surgery. You have been prescribed Sennakot-S to take at bedtime every evening after surgery to keep bowel movements regular and to prevent constipation.     Wound Care: 1. Keep clean and dry.  Shower daily.   Reasons to call the Doctor: Fever - Oral temperature greater than 100.4 degrees Fahrenheit Foul-smelling vaginal discharge Difficulty urinating Nausea and vomiting Increased pain at the site of the incision that is unrelieved with pain medicine. Difficulty breathing with or without chest pain New calf pain especially if only on one side Sudden, continuing increased vaginal bleeding with or without clots.   Contacts: For questions or concerns you should contact:   Dr. Comer Viktoria at  [phone]   Eleanor Epps, NP at [phone]   After Hours: call [phone] and have the GYN Oncologist paged/contacted (after 5 pm or on the weekends). You will speak with an after hours RN and let he or she know you have had surgery.   Messages sent via mychart are for non-urgent matters and are not responded to after hours so for urgent needs, please call the after hours number.

## 2024-04-18 NOTE — Progress Notes (Signed)
 " Case: [id number] Date/Time: 04/19/24 0715   Procedures:      LYMPHADENECTOMY, PELVIS, ROBOT-ASSISTED - robotic assisted selective lymphadenectomy, possible laparotomy, possible vulvar biopsies     BIOPSY, VULVA - possible vulvar biopsies   Anesthesia type: General   Pre-op diagnosis:      VULVAR CANCER C51.9     ABNORMAL PET SCAN  R94.8   Location: [facility] ROOM 05 / [facility]   Surgeons: Viktoria Comer SAUNDERS, MD       DISCUSSION: Leslie [name] is a 69 yo female with PMH of former smoking, RLS, hiatal hernia, hx of gastric bypass (2020), hypothyroid, anxiety, depression, obesity (BMI 45), vulvar cancer s/p vulvectomy and radiation (11/2023).  Patient with stage 3 vulvar cancer s/p partial modified radical vulvectomy and right inguinal node dissection with 1/4 lymph nodes positive for metastatic carcinoma, measuring 5 mm in greatest dimension. She is s/p radiation. PET scan obtained on 03/19/24 showed hypermetabolic lymph node on the left. Now scheduled for surgery above.  Seen by PCP on 02/22/24 for wellness visit. All issues stable. Advised f/u in 6 months.  VS: Ht 5' 2 (1.575 m)   Wt 112.5 kg   BMI 45.36 kg/m   Wt Readings from Last 3 Encounters:  04/18/24 112.5 kg  04/16/24 112.5 kg  04/06/24 110.5 kg   Temp Readings from Last 3 Encounters:  04/18/24 36.9 C (Oral)  04/06/24 37.1 C (Oral)  01/13/24 37.1 C (Oral)   BP Readings from Last 3 Encounters:  04/18/24 131/71  04/06/24 (!) 106/55  01/13/24 125/67   Pulse Readings from Last 3 Encounters:  04/18/24 67  04/06/24 81  01/13/24 66     PROVIDERS: Juliane Che, PA   LABS: Labs reviewed: Acceptable for surgery. (all labs ordered are listed, but only abnormal results are displayed)  Labs Reviewed - No data to display   PET scan 03/19/24:  IMPRESSION: 1. Single hypermetabolic left external iliac lymph node measuring 7 mm (image 168). 2. Interval resolution of previously seen metabolic activity associated  with external genitalia. 3. Midline tracer activity at the introitus is most consistent with urine contamination   Past Medical History:  Diagnosis Date   Anal squamous cell carcinoma (HCC)    Anxiety    Arthritis    Depression    History of adenomatous polyp of colon 2015   History of hiatal hernia    History of radiation therapy    Right Pelvis-09/21/23-11/01/23- Dr. Lynwood Nasuti   Hypothyroidism    IDA (iron deficiency anemia)    hematolody-- dr a. 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   Vulva cancer [facility])     Past Surgical History:  Procedure Laterality Date   BREAST REDUCTION SURGERY  2001   CATARACT EXTRACTION, BILATERAL     COLONOSCOPY WITH PROPOFOL   06/04/2013   dr avram   DILATION AND CURETTAGE OF UTERUS     yrs ago   EXAM UNDER ANESTHESIA, PELVIC N/A 11/24/2023   Procedure: EXAM UNDER ANESTHESIA, PELVIC;  Surgeon: Viktoria Comer SAUNDERS, MD;  Location: [facility];  Service: Gynecology;  Laterality: N/A;   INGUINAL LYMPHADENECTOMY Right 08/03/2023   Procedure: LYMPHADENECTOMY, INGUINAL, OPEN; VULVA BIOPSY;  Surgeon: Viktoria Comer SAUNDERS, MD;  Location[facility];  Service: Gynecology;  Laterality: Right;   LAPAROSCOPIC GASTRIC BANDING WITH HIATAL HERNIA REPAIR  03/28/2006   @  [facility] by dr b. mikell   LAPAROSCOPIC ROUX-EN-Y GASTRIC BYPASS WITH UPPER ENDOSCOPY AND REMOVAL OF LAP BAND  02/26/2019   @ [facility] by dr j. dasher   LESION DESTRUCTION N/A 11/24/2023   Procedure: DESTRUCTION, LESION, GENITALIA;  Surgeon: Viktoria Comer SAUNDERS, MD;  [facility];  Service: Gynecology;  Laterality: N/A;   LESION REMOVAL N/A 12/30/2022   Procedure: EXCISION BIOPSY PERIANAL LESION;  Surgeon: Debby Hila, MD;  Location: [facility];  Service: General;  Laterality: N/A;   VULVA MARYBETH BIOPSY N/A 04/13/2023    Procedure: VULVAR BIOPSY WITH PAPSMERE;  Surgeon: Jeralyn Crutch, MD;  Location: [facility];  Service: Gynecology;  Laterality: N/A;   VULVA /PERINEUM BIOPSY N/A 11/24/2023   Procedure: BIOPSY, VULVA;  Surgeon: Viktoria Comer SAUNDERS, MD;  Location[facility];  Service: Gynecology;  Laterality: N/A;   VULVECTOMY N/A 05/09/2023   Procedure: PARTIAL MODIFIED RADICAL VULVECTOMY;  Surgeon: Viktoria Comer SAUNDERS, MD;  Location[facility];  Service: Gynecology;  Laterality: N/A;   WISDOM TOOTH EXTRACTION      MEDICATIONS:  oxyCODONE  (OXY IR/ROXICODONE ) 5 MG immediate release tablet   senna-docusate (SENOKOT-S) 8.6-50 MG tablet   acetaminophen  (TYLENOL ) 500 MG tablet   BIOTIN PO   Calcium  Carb-Cholecalciferol (CALCIUM  500 + D PO)   Cholecalciferol (VITAMIN D3) 125 MCG (5000 UT) TABS   clobetasol  ointment (TEMOVATE ) 0.05 %   Ferrous Sulfate (IRON SUPPLEMENT PO)   FLUoxetine  (PROZAC ) 40 MG capsule   hydrOXYzine  (ATARAX ) 25 MG tablet   levothyroxine  (SYNTHROID ) 137 MCG tablet   loratadine (CLARITIN) 10 MG tablet   mineral oil-hydrophilic petrolatum  (AQUAPHOR) ointment   Multiple Vitamins-Minerals (MULTIVITAMIN GUMMIES ADULTS PO)   nystatin  (MYCOSTATIN /NYSTOP ) powder   phentermine 15 MG capsule   Probiotic Product (PROBIOTIC PO)   TRIAMCINOLONE ACETONIDE, TOP, 0.05 % OINT   No current facility-administered medications for this encounter.   Burnard CHRISTELLA Odis DEVONNA [facility] Short Stay/Anesthesiology [facility] Phone [phone] 04/18/2024 3:14 PM      "

## 2024-04-19 ENCOUNTER — Ambulatory Visit: Payer: Self-pay | Admitting: *Deleted

## 2024-04-19 ENCOUNTER — Other Ambulatory Visit: Payer: Self-pay

## 2024-04-19 ENCOUNTER — Encounter (HOSPITAL_COMMUNITY): Payer: Self-pay | Admitting: Gynecologic Oncology

## 2024-04-19 ENCOUNTER — Encounter (HOSPITAL_COMMUNITY): Admitting: Medical

## 2024-04-19 ENCOUNTER — Ambulatory Visit (HOSPITAL_COMMUNITY)
Admission: RE | Admit: 2024-04-19 | Discharge: 2024-04-19 | Disposition: A | Discharge status: Home / Self Care | Attending: Gynecologic Oncology | Admitting: Gynecologic Oncology

## 2024-04-19 ENCOUNTER — Other Ambulatory Visit: Payer: Self-pay | Admitting: Gynecologic Oncology

## 2024-04-19 ENCOUNTER — Ambulatory Visit (HOSPITAL_BASED_OUTPATIENT_CLINIC_OR_DEPARTMENT_OTHER): Admitting: Anesthesiology

## 2024-04-19 ENCOUNTER — Encounter (HOSPITAL_COMMUNITY): Admission: RE | Disposition: A | Payer: Self-pay | Source: Home / Self Care | Attending: Gynecologic Oncology

## 2024-04-19 DIAGNOSIS — F419 Anxiety disorder, unspecified: Secondary | ICD-10-CM | POA: Diagnosis not present

## 2024-04-19 DIAGNOSIS — C775 Secondary and unspecified malignant neoplasm of intrapelvic lymph nodes: Secondary | ICD-10-CM

## 2024-04-19 DIAGNOSIS — F32A Depression, unspecified: Secondary | ICD-10-CM | POA: Diagnosis not present

## 2024-04-19 DIAGNOSIS — E039 Hypothyroidism, unspecified: Secondary | ICD-10-CM

## 2024-04-19 DIAGNOSIS — Z87891 Personal history of nicotine dependence: Secondary | ICD-10-CM | POA: Diagnosis not present

## 2024-04-19 DIAGNOSIS — R87612 Low grade squamous intraepithelial lesion on cytologic smear of cervix (LGSIL): Secondary | ICD-10-CM | POA: Insufficient documentation

## 2024-04-19 DIAGNOSIS — R590 Localized enlarged lymph nodes: Secondary | ICD-10-CM

## 2024-04-19 DIAGNOSIS — Z79890 Hormone replacement therapy (postmenopausal): Secondary | ICD-10-CM | POA: Insufficient documentation

## 2024-04-19 DIAGNOSIS — Z8544 Personal history of malignant neoplasm of other female genital organs: Secondary | ICD-10-CM | POA: Diagnosis not present

## 2024-04-19 DIAGNOSIS — C519 Malignant neoplasm of vulva, unspecified: Secondary | ICD-10-CM | POA: Diagnosis present

## 2024-04-19 DIAGNOSIS — R748 Abnormal levels of other serum enzymes: Secondary | ICD-10-CM

## 2024-04-19 DIAGNOSIS — F418 Other specified anxiety disorders: Secondary | ICD-10-CM

## 2024-04-19 DIAGNOSIS — R948 Abnormal results of function studies of other organs and systems: Secondary | ICD-10-CM

## 2024-04-19 DIAGNOSIS — R9389 Abnormal findings on diagnostic imaging of other specified body structures: Secondary | ICD-10-CM | POA: Diagnosis not present

## 2024-04-19 DIAGNOSIS — Z9884 Bariatric surgery status: Secondary | ICD-10-CM | POA: Diagnosis not present

## 2024-04-19 DIAGNOSIS — L900 Lichen sclerosus et atrophicus: Secondary | ICD-10-CM | POA: Insufficient documentation

## 2024-04-19 LAB — TYPE AND SCREEN
ABO/RH(D): A POS
Antibody Screen: NEGATIVE

## 2024-04-19 MED ORDER — ORAL CARE MOUTH RINSE: 15.0000 mL | Freq: Once | OROMUCOSAL | Status: AC

## 2024-04-19 MED ORDER — LIDOCAINE HCL (PF) 2 % IJ SOLN
INTRAMUSCULAR | Status: AC
  Filled 2024-04-19: qty 10

## 2024-04-19 MED ORDER — PHENYLEPHRINE 80 MCG/ML (10ML) SYRINGE FOR IV PUSH (FOR BLOOD PRESSURE SUPPORT)
PREFILLED_SYRINGE | INTRAVENOUS | Status: DC | PRN
  Administered 2024-04-19: 80 ug via INTRAVENOUS

## 2024-04-19 MED ORDER — HEPARIN SODIUM (PORCINE) 5000 UNIT/ML IJ SOLN
5000.0000 [IU] | INTRAMUSCULAR | Status: AC
  Administered 2024-04-19: 5000 [IU] via SUBCUTANEOUS
  Filled 2024-04-19: qty 1

## 2024-04-19 MED ORDER — ONDANSETRON HCL 4 MG/2ML IJ SOLN
INTRAMUSCULAR | Status: DC | PRN
  Administered 2024-04-19: 4 mg via INTRAVENOUS

## 2024-04-19 MED ORDER — PHENYLEPHRINE 80 MCG/ML (10ML) SYRINGE FOR IV PUSH (FOR BLOOD PRESSURE SUPPORT)
PREFILLED_SYRINGE | INTRAVENOUS | Status: AC
  Filled 2024-04-19: qty 10

## 2024-04-19 MED ORDER — MIDAZOLAM HCL 2 MG/2ML IJ SOLN
INTRAMUSCULAR | Status: AC
  Filled 2024-04-19: qty 2

## 2024-04-19 MED ORDER — SUGAMMADEX SODIUM 200 MG/2ML IV SOLN
INTRAVENOUS | Status: AC
  Filled 2024-04-19: qty 4

## 2024-04-19 MED ORDER — FENTANYL CITRATE (PF) 250 MCG/5ML IJ SOLN
INTRAMUSCULAR | Status: AC
  Filled 2024-04-19: qty 5

## 2024-04-19 MED ORDER — KETAMINE HCL 50 MG/5ML IJ SOSY
PREFILLED_SYRINGE | INTRAMUSCULAR | Status: DC | PRN
  Administered 2024-04-19: 20 mg via INTRAVENOUS

## 2024-04-19 MED ORDER — MIDAZOLAM HCL (PF) 2 MG/2ML IJ SOLN
INTRAMUSCULAR | Status: DC | PRN
  Administered 2024-04-19: 1 mg via INTRAVENOUS

## 2024-04-19 MED ORDER — STERILE WATER FOR INJECTION IJ SOLN
INTRAMUSCULAR | Status: AC
  Filled 2024-04-19: qty 10

## 2024-04-19 MED ORDER — ACETIC ACID 5 % SOLN
Status: DC | PRN
  Administered 2024-04-19: 1 via TOPICAL

## 2024-04-19 MED ORDER — CHLORHEXIDINE GLUCONATE 0.12 % MT SOLN
15.0000 mL | Freq: Once | OROMUCOSAL | Status: AC
  Administered 2024-04-19: 15 mL via OROMUCOSAL

## 2024-04-19 MED ORDER — PROPOFOL 10 MG/ML IV BOLUS
INTRAVENOUS | Status: AC
  Filled 2024-04-19: qty 20

## 2024-04-19 MED ORDER — OXYCODONE HCL 5 MG/5ML PO SOLN: 5.0000 mg | Freq: Once | ORAL | Status: DC | PRN

## 2024-04-19 MED ORDER — OXYCODONE HCL 5 MG PO TABS: 5.0000 mg | ORAL_TABLET | Freq: Once | ORAL | Status: DC | PRN

## 2024-04-19 MED ORDER — STERILE WATER FOR INJECTION IJ SOLN
INTRAMUSCULAR | Status: DC | PRN
  Administered 2024-04-19: 10 mL via INTRAMUSCULAR

## 2024-04-19 MED ORDER — SUGAMMADEX SODIUM 200 MG/2ML IV SOLN
INTRAVENOUS | Status: DC | PRN
  Administered 2024-04-19: 400 mg via INTRAVENOUS

## 2024-04-19 MED ORDER — DEXAMETHASONE SOD PHOSPHATE PF 10 MG/ML IJ SOLN
INTRAMUSCULAR | Status: AC
  Filled 2024-04-19: qty 1

## 2024-04-19 MED ORDER — BUPIVACAINE LIPOSOME 1.3 % IJ SUSP
INTRAMUSCULAR | Status: AC
  Filled 2024-04-19: qty 20

## 2024-04-19 MED ORDER — LIDOCAINE HCL (PF) 2 % IJ SOLN
INTRAMUSCULAR | Status: DC | PRN
  Administered 2024-04-19: 40 mg via INTRADERMAL

## 2024-04-19 MED ORDER — FENTANYL CITRATE (PF) 250 MCG/5ML IJ SOLN
INTRAMUSCULAR | Status: DC | PRN
  Administered 2024-04-19: 25 ug via INTRAVENOUS
  Administered 2024-04-19: 100 ug via INTRAVENOUS
  Administered 2024-04-19: 50 ug via INTRAVENOUS

## 2024-04-19 MED ORDER — PROPOFOL 10 MG/ML IV BOLUS
INTRAVENOUS | Status: DC | PRN
  Administered 2024-04-19: 20 mg via INTRAVENOUS
  Administered 2024-04-19: 150 mg via INTRAVENOUS
  Administered 2024-04-19: 30 mg via INTRAVENOUS

## 2024-04-19 MED ORDER — ONDANSETRON HCL 4 MG/2ML IJ SOLN
INTRAMUSCULAR | Status: AC
  Filled 2024-04-19: qty 2

## 2024-04-19 MED ORDER — DEXAMETHASONE SOD PHOSPHATE PF 10 MG/ML IJ SOLN
4.0000 mg | INTRAMUSCULAR | Status: AC
  Administered 2024-04-19: 4 mg via INTRAVENOUS

## 2024-04-19 MED ORDER — ACETIC ACID 5 % SOLN
Status: AC
  Filled 2024-04-19: qty 12

## 2024-04-19 MED ORDER — KETAMINE HCL 50 MG/5ML IJ SOSY
PREFILLED_SYRINGE | INTRAMUSCULAR | Status: AC
  Filled 2024-04-19: qty 5

## 2024-04-19 MED ORDER — STERILE WATER FOR IRRIGATION IR SOLN
Status: DC | PRN
  Administered 2024-04-19: 1000 mL

## 2024-04-19 MED ORDER — LIDOCAINE 2% (20 MG/ML) 5 ML SYRINGE
INTRAMUSCULAR | Status: DC | PRN
  Administered 2024-04-19: 1.5 mg/kg/h via INTRAVENOUS

## 2024-04-19 MED ORDER — BUPIVACAINE HCL 0.25 % IJ SOLN
INTRAMUSCULAR | Status: DC | PRN
  Administered 2024-04-19: 30 mL

## 2024-04-19 MED ORDER — FENTANYL CITRATE (PF) 50 MCG/ML IJ SOSY
25.0000 ug | PREFILLED_SYRINGE | INTRAMUSCULAR | Status: DC | PRN
  Administered 2024-04-19: 50 ug via INTRAVENOUS
  Administered 2024-04-19: 25 ug via INTRAVENOUS

## 2024-04-19 MED ORDER — ROCURONIUM BROMIDE 10 MG/ML (PF) SYRINGE
PREFILLED_SYRINGE | INTRAVENOUS | Status: DC | PRN
  Administered 2024-04-19: 50 mg via INTRAVENOUS
  Administered 2024-04-19: 10 mg via INTRAVENOUS
  Administered 2024-04-19: 20 mg via INTRAVENOUS

## 2024-04-19 MED ORDER — LACTATED RINGERS IR SOLN
Status: DC | PRN
  Administered 2024-04-19: 1000 mL

## 2024-04-19 MED ORDER — LIDOCAINE HCL (PF) 2 % IJ SOLN
INTRAMUSCULAR | Status: AC
  Filled 2024-04-19: qty 5

## 2024-04-19 MED ORDER — BUPIVACAINE HCL (PF) 0.25 % IJ SOLN
INTRAMUSCULAR | Status: AC
  Filled 2024-04-19: qty 30

## 2024-04-19 MED ORDER — ROCURONIUM BROMIDE 10 MG/ML (PF) SYRINGE
PREFILLED_SYRINGE | INTRAVENOUS | Status: AC
  Filled 2024-04-19: qty 10

## 2024-04-19 MED ORDER — ACETAMINOPHEN 500 MG PO TABS
1000.0000 mg | ORAL_TABLET | ORAL | Status: AC
  Administered 2024-04-19: 1000 mg via ORAL
  Filled 2024-04-19: qty 2

## 2024-04-19 MED ORDER — FENTANYL CITRATE (PF) 50 MCG/ML IJ SOSY
PREFILLED_SYRINGE | INTRAMUSCULAR | Status: AC
  Filled 2024-04-19: qty 2

## 2024-04-19 MED ORDER — ONDANSETRON HCL 4 MG/2ML IJ SOLN: 4.0000 mg | Freq: Four times a day (QID) | INTRAMUSCULAR | Status: DC | PRN

## 2024-04-19 MED ORDER — LACTATED RINGERS IV SOLN: INTRAVENOUS | Status: DC

## 2024-04-19 MED ORDER — SODIUM CHLORIDE 0.9 % IV SOLN: INTRAVENOUS | Status: DC | PRN

## 2024-04-19 NOTE — Anesthesia Postprocedure Evaluation (Signed)
"   Anesthesia Post Note  Patient: Leslie [name]  Procedure(s) Performed: LYMPHADENECTOMY, PELVIS, ROBOT-ASSISTED BIOPSY, VULVA     Patient location during evaluation: PACU Anesthesia Type: General Level of consciousness: awake and alert Pain management: pain level controlled Vital Signs Assessment: post-procedure vital signs reviewed and stable Respiratory status: spontaneous breathing, nonlabored ventilation, respiratory function stable and patient connected to nasal cannula oxygen Cardiovascular status: blood pressure returned to baseline and stable Postop Assessment: no apparent nausea or vomiting Anesthetic complications: no   No notable events documented.  Last Vitals:  Vitals:   04/19/24 1149 04/19/24 1150  BP: 136/72   Pulse: 75   Resp:    Temp:    SpO2: 92% 97%    Last Pain:  Vitals:   04/19/24 1149  TempSrc:   PainSc: 2                  Jourdain Guay S      "

## 2024-04-19 NOTE — Telephone Encounter (Signed)
-----   Message from Eleanor Epps, NP sent at 04/19/2024  9:32 AM EST ----- Please fax results of these labs to PCP. I am going to order repeat liver function tests to be drawn at her post-op appt with us . Please make a lab appt before her visit for the labs.

## 2024-04-19 NOTE — Op Note (Signed)
 OPERATIVE NOTE  Pre-operative Diagnosis: history of vulvar cancer now with PET positive left external iliac lymph node  Post-operative Diagnosis: same  Operation: Exam under anesthesia, vulvar biopsies, robotic-assisted laparoscopic selective left external iliac lymphadenectomy  Surgeon: Viktoria Crank MD  Assistant Surgeon: Eleanor Epps, NP (an NP assistant was necessary for tissue manipulation, management of robotic instrumentation, retraction and positioning due to the complexity of the case and hospital policies).   Anesthesia: GET  Urine Output: 75 cc  Operative Findings: On EUA, vulvar changes consistent with prior surgery and lichen sclerosus. On speculum exam, normal cervix, no vaginal lesions. Narrowed introitus. After application of 5% acetic acid , some mild acetowhite along the mid right vulva (especially at the introitus) and along the perineum. NO areas of ulceration. Biopsies taken. On intra-abdominal entry, normal upper abdominal survey. Normal appearing omentum, small and large bowel. Uterus 6 cm and normal in appearance. Normal bilateral tubes and ovaries. NO ascites. Two mildly prominent external iliac lymph nodes on the left at the level of the circumflex iliac vein.  Estimated Blood Loss:  25 cc      Total IV Fluids: see I&O flowsheet         Specimens: vulvar biopsies at 6 and 9 o'clock, lateral right vulva at 9 o'clock; left external iliac lymph nodes         Complications:  None apparent; patient tolerated the procedure well.         Disposition: PACU - hemodynamically stable.  Procedure Details  The patient was seen in the Holding Room. The risks, benefits, complications, treatment options, and expected outcomes were discussed with the patient.  The patient concurred with the proposed plan, giving informed consent.  The site of surgery properly noted/marked. The patient was identified as Leslie [name] and the procedure verified as a Robotic-assisted  selective pelvic lymphadenectomy, vulvar biopsies.   After induction of anesthesia, the patient was draped and prepped in the usual sterile manner. Patient was placed in supine position after anesthesia and draped and prepped in the usual sterile manner as follows: Her arms were tucked to her side with all appropriate precautions.  The patient was secured to the bed using padding and tape across her chest.  The patient was placed in the semi-lithotomy position in Kinta stirrups.  The perineum and vagina were prepped with CHG. The patient's abdomen was prepped with ChloraPrep and she was draped after the prep had been allowed to dry for 3 minutes.  A Time Out was held and the above information confirmed.  The urethra was prepped with Betadine . Foley catheter was placed.  A sterile speculum was placed in the vagina with findings noted above. 5% acetic acid  was then applied to the vulva with findings as above. Tischler forceps were used to take multiple vulvar biopsies. Monopolar electrocautery was used to achieve hemostasis.     Next, a 10 mm skin incision was made 5 cm below the subcostal margin in the midclavicular line.  The 5 mm Optiview port and scope was used for direct entry.  Opening pressure was under 10 mm CO2.  The abdomen was insufflated and the findings were noted as above.   At this point and all points during the procedure, the patient's intra-abdominal pressure did not exceed 15 mmHg. Next, an 8 mm skin incision was made superior to the umbilicus and a right and left port were placed about 8 cm lateral to the robot port on the right and left side.  A fourth arm  was placed on the right.  An incision was placed in the left mid abdomen (between the left lateral and supraumbilical trocars and a 12 mm airseal port was placed. All ports were placed under direct visualization.  The patient was placed in steep Trendelenburg.  Bowel was folded away into the upper abdomen.  The robot was docked in the  normal manner.  The left peritoneum was opened parallel to the IP ligament to open the retroperitoneal space. The round ligament was transected. The The paravesical and pararectal spaces were open. Two mildly enlarged and prominent lymph nodes were noted along the distal external iliac vessels after dissection of the more superficial adipose and lymphatic tissue from along the course of the external iliac artery. These lymph nodes were at the level of the circumflex iliac vein. After identifying all surrounding vasculature structures, these lymph nodes were removed together with more proximal external iliac lymph nodes. All lymph nodes were placed in a bag. A metal clip was used to mark the just distal to area where prominent lymph nodes encountered/resected. Irrigation was used and excellent hemostasis was achieved.    2-0 Vicryl was then used to reattach the round ligament with a figure of eight stitch. The peritoneum anterior to the round ligament that had been opened was closed with a running stitch of 2-0 Vicryl to close the small peritoneal window anterior to the round ligament.   At this point in the procedure was completed.  Robotic instruments were removed under direct visulaization.  The robot was undocked. The fascia at the 12 mm port was closed with 0 Vicryl using a PMI fascial closure device.  The subcuticular tissue was closed with 4-0 Vicryl and the skin was closed with 4-0 Monocryl in a subcuticular manner.  Dermabond was applied.    Foley catheter was removed. Vulvar biopsy sites were hemostatic.  All sponge, lap and needle counts were correct x  3.   The patient was transferred to the recovery room in stable condition.  Comer Dollar, MD

## 2024-04-19 NOTE — Interval H&P Note (Signed)
 History and Physical Interval Note:  04/19/2024 6:40 AM  Leslie [name]  has presented today for surgery, with the diagnosis of VULVAR CANCER C51.9 ABNORMAL PET SCAN  R94.8.  The various methods of treatment have been discussed with the patient and family. After consideration of risks, benefits and other options for treatment, the patient has consented to  Procedures with comments: LYMPHADENECTOMY, PELVIS, ROBOT-ASSISTED (N/A) - robotic assisted selective lymphadenectomy, possible laparotomy, possible vulvar biopsies BIOPSY, VULVA (N/A) - possible vulvar biopsies as a surgical intervention.  The patient's history has been reviewed, patient examined, no change in status, stable for surgery.  I have reviewed the patient's chart and labs.  Questions were answered to the patient's satisfaction.     Comer JONELLE Dollar

## 2024-04-19 NOTE — Telephone Encounter (Signed)
 Patient scheduled for lab appointment for repeat labs on Friday, February 20 th at 0800 prior to post op appointment with Dr. Viktoria.  Labs faxed to patient's PCP -[facility] Bernita [place], [place] at Fax# [phone].

## 2024-04-19 NOTE — Anesthesia Preprocedure Evaluation (Signed)
"                                    Anesthesia Evaluation  Patient identified by MRN, date of birth, ID band Patient awake    Reviewed: Allergy & Precautions, H&P , NPO status , Patient's Chart, lab work & pertinent test results  Airway Mallampati: II   Neck ROM: full    Dental   Pulmonary former smoker   breath sounds clear to auscultation       Cardiovascular negative cardio ROS  Rhythm:regular Rate:Normal     Neuro/Psych  PSYCHIATRIC DISORDERS Anxiety Depression       GI/Hepatic hiatal hernia,,,S/p gastric bypass   Endo/Other  Hypothyroidism  Class 3 obesity  Renal/GU      Musculoskeletal  (+) Arthritis ,    Abdominal   Peds  Hematology   Anesthesia Other Findings   Reproductive/Obstetrics Vulvar CA                              Anesthesia Physical Anesthesia Plan  ASA: 3  Anesthesia Plan: General   Post-op Pain Management:    Induction: Intravenous  PONV Risk Score and Plan: 3 and Ondansetron , Dexamethasone , Midazolam  and Treatment may vary due to age or medical condition  Airway Management Planned: Oral ETT  Additional Equipment:   Intra-op Plan:   Post-operative Plan: Extubation in OR  Informed Consent: I have reviewed the patients History and Physical, chart, labs and discussed the procedure including the risks, benefits and alternatives for the proposed anesthesia with the patient or authorized representative who has indicated his/her understanding and acceptance.     Dental advisory given  Plan Discussed with: CRNA, Anesthesiologist and Surgeon  Anesthesia Plan Comments:         Anesthesia Quick Evaluation  "

## 2024-04-19 NOTE — Transfer of Care (Signed)
 Immediate Anesthesia Transfer of Care Note  Patient: Leslie [name]  Procedure(s) Performed: LYMPHADENECTOMY, PELVIS, ROBOT-ASSISTED BIOPSY, VULVA  Patient Location: PACU  Anesthesia Type:General  Level of Consciousness: awake, oriented, and patient cooperative  Airway & Oxygen Therapy: Patient Spontanous Breathing and Patient connected to face mask oxygen  Post-op Assessment: Report given to RN and Post -op Vital signs reviewed and stable  Post vital signs: Reviewed and stable; warm blankets and Bair hugger applied to pt.  Last Vitals:  Vitals Value Taken Time  BP 128/71 04/19/24 09:38  Temp 97.16F 04/19/24 09:41  Pulse 75 04/19/24 09:39  Resp 16 04/19/24 09:39  SpO2 98 % 04/19/24 09:39  Vitals shown include unfiled device data.  Last Pain:  Vitals:   04/19/24 0650  TempSrc: Oral  PainSc:          Complications: No notable events documented.

## 2024-04-19 NOTE — Anesthesia Procedure Notes (Signed)
 Procedure Name: Intubation Date/Time: 04/19/2024 7:41 AM  Performed by: Augusta Daved SAILOR, CRNAPre-anesthesia Checklist: Patient identified, Emergency Drugs available, Suction available and Patient being monitored Patient Re-evaluated:Patient Re-evaluated prior to induction Oxygen Delivery Method: Circle System Utilized Preoxygenation: Pre-oxygenation with 100% oxygen Induction Type: IV induction Ventilation: Mask ventilation without difficulty Laryngoscope Size: Glidescope and 3 Grade View: Grade I Tube type: Oral Tube size: 7.0 mm Number of attempts: 1 Airway Equipment and Method: Stylet and Oral airway Placement Confirmation: ETT inserted through vocal cords under direct vision, positive ETCO2 and breath sounds checked- equal and bilateral Secured at: 21 (at the lip) cm Tube secured with: Tape Dental Injury: Teeth and Oropharynx as per pre-operative assessment

## 2024-04-20 ENCOUNTER — Encounter (HOSPITAL_COMMUNITY): Payer: Self-pay | Admitting: Gynecologic Oncology

## 2024-04-20 NOTE — Telephone Encounter (Signed)
 Spoke with Leslie [name] this morning. She states she is eating, drinking and urinating well. She has not had a BM yet but is passing gas. She is taking senokot as prescribed and encouraged her to drink plenty of water . She denies fever or chills. Incisions are dry and intact. She rates her pain 3/10. Her pain is controlled with oxycodone .     Instructed to call office with any fever, chills, purulent drainage, uncontrolled pain or any other questions or concerns. Patient verbalizes understanding.   Pt aware of post op appointments as well as the office number [phone] and after hours number [phone] to call if she has any questions or concerns

## 2024-04-23 ENCOUNTER — Ambulatory Visit: Payer: Self-pay | Admitting: Gynecologic Oncology

## 2024-04-23 LAB — SURGICAL PATHOLOGY

## 2024-04-25 ENCOUNTER — Encounter: Payer: Self-pay | Admitting: Oncology

## 2024-04-25 DIAGNOSIS — C519 Malignant neoplasm of vulva, unspecified: Secondary | ICD-10-CM

## 2024-04-25 NOTE — Progress Notes (Signed)
 Referral to radiation oncology placed per Dr. Pricilla Holm.

## 2024-04-26 ENCOUNTER — Telehealth: Payer: Self-pay | Admitting: Oncology

## 2024-04-26 DIAGNOSIS — C519 Malignant neoplasm of vulva, unspecified: Secondary | ICD-10-CM

## 2024-04-26 NOTE — Telephone Encounter (Signed)
 [facility] and scheduled a new patient appointment with Dr. Lonn to discuss chemotherapy on 05/10/24 at 1:00 with arrival to the cancer center to check in at 12:30.  Also discussed that radiation oncology will be calling her to schedule an appointment with Dr. Shannon.  She verbalized understanding and agreement.

## 2024-05-02 ENCOUNTER — Ambulatory Visit: Admitting: Radiation Oncology

## 2024-05-02 ENCOUNTER — Ambulatory Visit

## 2024-05-10 ENCOUNTER — Inpatient Hospital Stay: Attending: Gynecologic Oncology | Admitting: Hematology and Oncology

## 2024-05-11 ENCOUNTER — Inpatient Hospital Stay

## 2024-05-11 ENCOUNTER — Inpatient Hospital Stay: Admitting: Gynecologic Oncology
